# Patient Record
Sex: Male | Born: 1950 | Race: White | Hispanic: No | Smoking: Never smoker
Health system: Southern US, Community
[De-identification: ages and names within clinical notes are randomized; demographics above are authoritative.]

## PROBLEM LIST (undated history)

## (undated) DIAGNOSIS — Z87442 Personal history of urinary calculi: Secondary | ICD-10-CM

## (undated) DIAGNOSIS — G4733 Obstructive sleep apnea (adult) (pediatric): Secondary | ICD-10-CM

## (undated) DIAGNOSIS — E559 Vitamin D deficiency, unspecified: Secondary | ICD-10-CM

## (undated) DIAGNOSIS — M109 Gout, unspecified: Secondary | ICD-10-CM

## (undated) DIAGNOSIS — I10 Essential (primary) hypertension: Secondary | ICD-10-CM

## (undated) DIAGNOSIS — M47812 Spondylosis without myelopathy or radiculopathy, cervical region: Secondary | ICD-10-CM

## (undated) DIAGNOSIS — E785 Hyperlipidemia, unspecified: Secondary | ICD-10-CM

## (undated) HISTORY — DX: Obstructive sleep apnea (adult) (pediatric): G47.33

## (undated) HISTORY — DX: Gout, unspecified: M10.9

## (undated) HISTORY — DX: Personal history of urinary calculi: Z87.442

## (undated) HISTORY — DX: Vitamin D deficiency, unspecified: E55.9

## (undated) HISTORY — PX: SHOULDER SURGERY: SHX246

## (undated) HISTORY — DX: Spondylosis without myelopathy or radiculopathy, cervical region: M47.812

## (undated) HISTORY — DX: Essential (primary) hypertension: I10

## (undated) HISTORY — DX: Hyperlipidemia, unspecified: E78.5

## (undated) HISTORY — PX: VASECTOMY: SHX75

## (undated) HISTORY — PX: OTHER SURGICAL HISTORY: SHX169

---

## 1999-03-03 ENCOUNTER — Encounter: Payer: Self-pay | Admitting: Emergency Medicine

## 1999-03-03 ENCOUNTER — Emergency Department (HOSPITAL_COMMUNITY): Admission: EM | Admit: 1999-03-03 | Discharge: 1999-03-03 | Payer: Self-pay | Admitting: Emergency Medicine

## 2010-02-16 ENCOUNTER — Encounter: Admission: RE | Admit: 2010-02-16 | Discharge: 2010-03-31 | Payer: Self-pay | Admitting: Sports Medicine

## 2012-10-01 HISTORY — PX: BUNIONECTOMY: SHX129

## 2013-07-15 ENCOUNTER — Institutional Professional Consult (permissible substitution): Payer: Self-pay

## 2013-07-17 ENCOUNTER — Encounter (INDEPENDENT_AMBULATORY_CARE_PROVIDER_SITE_OTHER): Payer: 59

## 2013-07-17 ENCOUNTER — Ambulatory Visit (INDEPENDENT_AMBULATORY_CARE_PROVIDER_SITE_OTHER): Payer: 59

## 2013-07-17 VITALS — BP 121/70 | HR 75 | Resp 17 | Wt 185.0 lb

## 2013-07-17 DIAGNOSIS — M2020 Hallux rigidus, unspecified foot: Secondary | ICD-10-CM

## 2013-07-17 DIAGNOSIS — M79609 Pain in unspecified limb: Secondary | ICD-10-CM

## 2013-07-17 DIAGNOSIS — M2022 Hallux rigidus, left foot: Secondary | ICD-10-CM

## 2013-07-17 DIAGNOSIS — M2012 Hallux valgus (acquired), left foot: Secondary | ICD-10-CM

## 2013-07-17 DIAGNOSIS — M2010 Hallux valgus (acquired), unspecified foot: Secondary | ICD-10-CM

## 2013-07-17 NOTE — H&P (Signed)
Marland Kitchen

## 2013-07-17 NOTE — Patient Instructions (Signed)
Pre-Operative Instructions  Congratulations, you have decided to take an important step to improving your quality of life.  You can be assured that the doctors of [facility] will be with you every step of the way.  1. Plan to be at the surgery center/hospital at least 1 (one) hour prior to your scheduled time unless otherwise directed by the surgical center/hospital staff.  You must have a responsible adult accompany you, remain during the surgery and drive you home.  Make sure you have directions to the surgical center/hospital and know how to get there on time. 2. For hospital based surgery you will need to obtain a history and physical form from your family physician within 1 month prior to the date of surgery- we will give you a form for you primary physician.  3. We make every effort to accommodate the date you request for surgery.  There are however, times where surgery dates or times have to be moved.  We will contact you as soon as possible if a change in schedule is required.   4. No Aspirin/Ibuprofen for one week before surgery.  If you are on aspirin, any non-steroidal anti-inflammatory medications (Mobic, Aleve, Ibuprofen) you should stop taking it 7 days prior to your surgery.  You make take Tylenol  For pain prior to surgery.  5. Medications- If you are taking daily heart and blood pressure medications, seizure, reflux, allergy, asthma, anxiety, pain or diabetes medications, make sure the surgery center/hospital is aware before the day of surgery so they may notify you which medications to take or avoid the day of surgery. 6. No food or drink after midnight the night before surgery unless directed otherwise by surgical center/hospital staff. 7. No alcoholic beverages 24 hours prior to surgery.  No smoking 24 hours prior to or 24 hours after surgery. 8. Wear loose pants or shorts- loose enough to fit over bandages, boots, and casts. 9. No slip on shoes, sneakers are best. 10. Bring  your boot with you to the surgery center/hospital.  Also bring crutches or a walker if your physician has prescribed it for you.  If you do not have this equipment, it will be provided for you after surgery. 11. If you have not been contracted by the surgery center/hospital by the day before your surgery, call to confirm the date and time of your surgery. 12. Leave-time from work may vary depending on the type of surgery you have.  Appropriate arrangements should be made prior to surgery with your employer. 13. Prescriptions will be provided immediately following surgery by your doctor.  Have these filled as soon as possible after surgery and take the medication as directed. 14. Remove nail polish on the operative foot. 15. Wash the night before surgery.  The night before surgery wash the foot and leg well with the antibacterial soap provided and water paying special attention to beneath the toenails and in between the toes.  Rinse thoroughly with water and dry well with a towel.  Perform this wash unless told not to do so by your physician.  Enclosed: 1 Ice pack (please put in freezer the night before surgery)   1 Hibiclens skin cleaner   Pre-op Instructions  If you have any questions regarding the instructions, do not hesitate to call our office.  [place]: [address] [place], [place] [address] [phone]  [place]: [address] , [place] [address] [phone]  [place]: [address]  [place], [place] [address] [phone]  Dr. Endora Teresi   Tuchman DPM, Dr. Norman Regal DPM Dr. Melek Pownall DPM, Dr. M. Todd Hyatt DPM, Dr. Kathryn Egerton DPM 

## 2013-07-17 NOTE — Progress Notes (Signed)
  Subjective:    Patient ID: Vincent White, male    DOB: 03-Nov-1950, 62 y.o.   MRN: [id number]  HPIN FOOT PAIN        L LEFT 1ST MPJ       D YEARS       O GRADUAL       C RED SWELLING PAIN       A ACTIVITY ENCLOSED SHOES       T LARGER SHOES, ORTHOTICS, PADDING  Review of Systems  Constitutional: Negative.   HENT: Negative.   Eyes: Negative.   Cardiovascular: Negative.   Gastrointestinal: Negative.   Musculoskeletal: Positive for neck pain.       Diagnosed arthritis  Skin: Negative.   Neurological: Negative.   Hematological: Negative.   Psychiatric/Behavioral: Negative.        Objective:   Physical Exam  Constitutional: He is oriented to person, place, and time. He appears well-developed and well-nourished.  Cardiovascular:  Pulses:      Dorsalis pedis pulses are 2+ on the right side, and 2+ on the left side.       Posterior tibial pulses are 2+ on the right side, and 2+ on the left side.  Capillary refill timed 3-4 seconds all digits bilateral. Skin temperature warm. Turgor normal. No varicosities no edema noted bilateral   Musculoskeletal:  Orthopedic biomechanical exam reveals corrected bunion of right foot with well-healed incision. Left foot has significant moderate to severe bunion deformity with lateral deviation of the hallux, and erythema of first MTP joint. X-rays confirm elevated I am approximately 15-16 sesamoid position 6-7 some asymmetric joint space narrowing in subluxation MTP to hallux abductus angle greater than 25 a wide first metatarsal is also identified radiographically x-rays taken in January of 2014  Neurological: He is alert and oriented to person, place, and time. He has normal strength and normal reflexes.  Epicritic and proprioceptive sensations intact and symmetric bilateral. Normal plantar response and DTRs bilateral.  Skin: Skin is warm and dry. No cyanosis. Nails show no clubbing.  Skin color pigment normal hair growth diminished absent distal  nails criptotic well-healed incision right first MTP joint. Left foot demonstrate significant HAV deformity with erythematous first MTP joint  Psychiatric: He has a normal mood and affect. His behavior is normal.          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 Previous surgery require Lapidus bunionectomy should note the right foot is doing well still some limitation of motion first MTP area patient does have some mild arthropathy the great toe joint otherwise is ambulating well. Left foot is amendable to an Eliberto Ivory bunionectomy rather than Lapidus procedure. Discussed the 2 options with patient, and confident that the Eliberto Ivory will provide adequate correction and shortening his rehabilitation and recovery time. Patient is aware that he will not have to be on crutches for the [facility]. At this time consent for [facility] with screw fixations reviewed and signed, all questions asked medication are answered at this time. Surgery was scheduled his continues with appropriate followup. Surgery will be at risk especially surgical center under IV sedation and regional anesthetic block  Alvan Dame DPM

## 2013-09-07 ENCOUNTER — Ambulatory Visit: Payer: 59

## 2013-09-07 DIAGNOSIS — M2010 Hallux valgus (acquired), unspecified foot: Secondary | ICD-10-CM

## 2013-09-15 ENCOUNTER — Ambulatory Visit (INDEPENDENT_AMBULATORY_CARE_PROVIDER_SITE_OTHER): Payer: 59

## 2013-09-15 VITALS — BP 142/84 | HR 74 | Temp 98.2°F | Resp 12

## 2013-09-15 DIAGNOSIS — M2010 Hallux valgus (acquired), unspecified foot: Secondary | ICD-10-CM

## 2013-09-15 DIAGNOSIS — Z9889 Other specified postprocedural states: Secondary | ICD-10-CM

## 2013-09-15 DIAGNOSIS — M2020 Hallux rigidus, unspecified foot: Secondary | ICD-10-CM

## 2013-09-15 DIAGNOSIS — M2022 Hallux rigidus, left foot: Secondary | ICD-10-CM

## 2013-09-15 DIAGNOSIS — M2012 Hallux valgus (acquired), left foot: Secondary | ICD-10-CM

## 2013-09-15 NOTE — Patient Instructions (Signed)
ICE INSTRUCTIONS  Apply ice or cold pack to the affected area at least 3 times a day for 10-15 minutes each time.  You should also use ice after prolonged activity or vigorous exercise.  Do not apply ice longer than 20 minutes at one time.  Always keep a cloth between your skin and the ice pack to prevent burns.  Being consistent and following these instructions will help control your symptoms.  We suggest you purchase a gel ice pack because they are reusable and do bit leak.  Some of them are designed to wrap around the area.  Use the method that works best for you.  Here are some other suggestions for icing.   Use a frozen bag of peas or corn-inexpensive and molds well to your body, usually stays frozen for 10 to 20 minutes.  Wet a towel with cold water and squeeze out the excess until it's damp.  Place in a bag in the freezer for 20 minutes. Then remove and use.  Also beginning continue daily range of motion exercises moving the big toe up and down. Move the toe up and down 100 times daily or whenever he had to opportunity. This to prevent stiffness and restore flexibility. Maintain anklet to keep down on swelling maintain boot for 4 more weeks to

## 2013-09-15 NOTE — Progress Notes (Signed)
   Subjective:    Patient ID: Vincent White, male    DOB: 1951/09/22, 62 y.o.   MRN: [id number]  HPI Comments: '' DOS 09/07/13 '' THE LT FOOT HAVE DISCOMFORT BUT NOT LIKE THE OTHER FOOT.''     Review of Systems deferred at this visit     Objective:   Physical Exam Neurovascular status is intact left foot has mild edema and ecchymosis 8 days status post Austin bunionectomy left foot with screw fixation. X-rays reveal intact screw fixation with clinical and radiographic alignment of the hallux. There is good range of motion dorsiflexion plantar flexion of the toe approximately 30-40 dorsiflexion 10-15 plantar flexion. Incision is clean dry and well coapted dressings were intact and dry      Assessment & Plan:  Assessment good postop progress following bunionectomy left foot presto compressive dressing reapplied. Maintain dressing until Sunday then can remove the dressing and begin normal bathing and hygiene maintain Ace anklet for compression also maintain fracture boot for 4 more weeks to mobilize foot. Return in 4 weeks for followup x-ray and reevaluation. Contact us with any changes or problems occur.  Alvan Dame DPM

## 2013-10-06 NOTE — Progress Notes (Signed)
1) Austin bunionectomy with screw fixation left foot

## 2013-10-16 ENCOUNTER — Ambulatory Visit (INDEPENDENT_AMBULATORY_CARE_PROVIDER_SITE_OTHER): Payer: 59

## 2013-10-16 VITALS — BP 142/84 | HR 86 | Resp 12

## 2013-10-16 DIAGNOSIS — Z9889 Other specified postprocedural states: Secondary | ICD-10-CM

## 2013-10-16 DIAGNOSIS — M2010 Hallux valgus (acquired), unspecified foot: Secondary | ICD-10-CM

## 2013-10-16 DIAGNOSIS — L259 Unspecified contact dermatitis, unspecified cause: Secondary | ICD-10-CM

## 2013-10-16 MED ORDER — CEPHALEXIN 500 MG PO CAPS: 500.0000 mg | ORAL_CAPSULE | Freq: Three times a day (TID) | ORAL | Status: DC

## 2013-10-16 NOTE — Progress Notes (Signed)
   Subjective:    Patient ID: Vincent White, male    DOB: September 13, 1951, 63 y.o.   MRN: [id number]  HPI patient presents at this time for postop visit is just over one month 56 week status post Austin bunionectomy with screw fixation left foot. No complaints of pain or discomfort ambulating with air fracture walker as instructed. In the last week or 2 develop a rash over the dorsum of the left foot over the first MTP and second MTP area he was applying some Neosporin and a Telfa pad to the area and caused a breakout he has similar probably is using Neosporin his hand and that was treated with clobetasol cream by his dermatologist.    Review of Systems no new changes or findings     Objective:   Physical Exam Neurovascular status is intact DP and PT posterior were for Refill time 3 seconds all digits there is an area of erythema with a rash with some slight weeping over the dorsum of first MTP area no increased temperature no ascending cellulitis or lymphangitis noted no malodor noted. Patient does describe some pruritus he stopped using the Neosporin and since then has not started on a steroid cream at this point. X-rays reveal good position the osteotomy and screw fixation clinically good range of motion the hallux alignment of the great toe no pain or tenderness and crepitus aren't or crepitus on range of motion or her ambulation. Patient may discontinue air fracture boot at this time return to come for walking tennis or athletic shoes. Patient been working as office said down to his job without restrictions at this point.       Assessment & Plan:  Assessment this time is eczema versus contact dermatitis dorsum left foot on your the operative site posse secondary to Neosporin Polysporin and synthetic dressing application. Plan at this time discontinued and discontinue the Neosporin patient will initiate clobetasol cream application daily maintain cotton gauze and anklet dressing to maintain  compression. Also placed on a short-term regimen of antibiotic cephalexin 5 mg daily x10 days as a precaution prevent any infective process. Reappointed in 4 weeks for further followup and reassessment both postoperatively and for the dermatitis.  Harriet Masson DPM

## 2013-10-16 NOTE — Patient Instructions (Signed)
ANTIBACTERIAL SOAP INSTRUCTIONS  THE DAY AFTER PROCEDURE  Please follow the instructions your doctor has marked.   Shower as usual. Before getting out, place a drop of antibacterial liquid soap (Dial) on a wet, clean washcloth.  Gently wipe washcloth over affected area.  Afterward, rinse the area with warm water.  Blot the area dry with a soft cloth and cover with antibiotic ointment (neosporin, polysporin, bacitracin) and band aid or gauze and tape  Place 3-4 drops of antibacterial liquid soap in a quart of warm tap water.  Submerge foot into water for 20 minutes.  If bandage was applied after your procedure, leave on to allow for easy lift off, then remove and continue with soak for the remaining time.  Next, blot area dry with a soft cloth and cover with a bandage.  Apply other medications as directed by your doctor,   After washing the foot and the area of rash applied clobetasol steroid cream as instructed once or twice daily for the next 2-3 weeks avoid any sympathetic dressings;  use plain cotton gauze and maintain anklet as needed

## 2013-11-13 ENCOUNTER — Ambulatory Visit (INDEPENDENT_AMBULATORY_CARE_PROVIDER_SITE_OTHER): Payer: 59

## 2013-11-13 VITALS — BP 152/84 | HR 74 | Resp 12

## 2013-11-13 DIAGNOSIS — M2010 Hallux valgus (acquired), unspecified foot: Secondary | ICD-10-CM

## 2013-11-13 DIAGNOSIS — Z9889 Other specified postprocedural states: Secondary | ICD-10-CM

## 2013-11-13 DIAGNOSIS — L259 Unspecified contact dermatitis, unspecified cause: Secondary | ICD-10-CM

## 2013-11-13 NOTE — Patient Instructions (Signed)
ICE INSTRUCTIONS  Apply ice or cold pack to the affected area at least 3 times a day for 10-15 minutes each time.  You should also use ice after prolonged activity or vigorous exercise.  Do not apply ice longer than 20 minutes at one time.  Always keep a cloth between your skin and the ice pack to prevent burns.  Being consistent and following these instructions will help control your symptoms.  We suggest you purchase a gel ice pack because they are reusable and do bit leak.  Some of them are designed to wrap around the area.  Use the method that works best for you.  Here are some other suggestions for icing.   Use a frozen bag of peas or corn-inexpensive and molds well to your body, usually stays frozen for 10 to 20 minutes.  Wet a towel with cold water and squeeze out the excess until it's damp.  Place in a bag in the freezer for 20 minutes. Then remove and use.  Use ice and possibly consider using compression stocking to help reduce the swelling over the next several months to do this on an as-needed basis

## 2013-11-13 NOTE — Progress Notes (Signed)
   Subjective:    Patient ID: Vincent White, male    DOB: [birth year], 63 y.o.   MRN: [id number]  HPI ''DOS 09/07/14  LT FOOT AND RASH ON TOP OF THE FOOT IS MUCH BETTER.''    Review of Systems no new changes or fine     Objective:   Physical Exam Vascular status is intact pedal pulses palpable DP and PT posterior were for left foot Refill time 3 seconds rashes result slight erythema the incision area consistent with postop course just over 2 months postop still some mild edema first MTP joint area although the range of motion approximately 45-50 dorsiflexion as well as 510 plantar flexion. There is no pain no crepitus patient ambulating comfortably has returned all regular work activities. Briefly discussed possibly future followup with orthoses Nadara Mustard this time maintain compression stable boot and shoe ice and heat help control the edema which should resolve over the next 3 months.       Assessment & Plan:  Assessment good postop progress following Austin bunionectomy screw fixation left foot. X-rays reveal good clinical and radiographic alignment is no displacements no pain mild postoperative edema still present recheck in 3 months for long-term postop followup consider orthoses in the future as well  Harriet Masson DPM

## 2014-02-12 ENCOUNTER — Ambulatory Visit: Payer: 59

## 2017-02-05 HISTORY — PX: OTHER SURGICAL HISTORY: SHX169

## 2017-03-04 DIAGNOSIS — E782 Mixed hyperlipidemia: Secondary | ICD-10-CM | POA: Diagnosis not present

## 2017-03-04 DIAGNOSIS — Z0000 Encounter for general adult medical examination without abnormal findings: Secondary | ICD-10-CM | POA: Diagnosis not present

## 2017-03-04 DIAGNOSIS — Z125 Encounter for screening for malignant neoplasm of prostate: Secondary | ICD-10-CM | POA: Diagnosis not present

## 2017-03-04 DIAGNOSIS — Z23 Encounter for immunization: Secondary | ICD-10-CM | POA: Diagnosis not present

## 2017-03-04 DIAGNOSIS — E559 Vitamin D deficiency, unspecified: Secondary | ICD-10-CM | POA: Diagnosis not present

## 2017-03-04 DIAGNOSIS — E119 Type 2 diabetes mellitus without complications: Secondary | ICD-10-CM | POA: Diagnosis not present

## 2017-03-04 DIAGNOSIS — Z8639 Personal history of other endocrine, nutritional and metabolic disease: Secondary | ICD-10-CM | POA: Diagnosis not present

## 2017-03-04 DIAGNOSIS — I10 Essential (primary) hypertension: Secondary | ICD-10-CM | POA: Diagnosis not present

## 2017-03-04 DIAGNOSIS — M109 Gout, unspecified: Secondary | ICD-10-CM | POA: Diagnosis not present

## 2017-03-04 DIAGNOSIS — Z7984 Long term (current) use of oral hypoglycemic drugs: Secondary | ICD-10-CM | POA: Diagnosis not present

## 2017-03-11 DIAGNOSIS — Z4789 Encounter for other orthopedic aftercare: Secondary | ICD-10-CM | POA: Diagnosis not present

## 2017-03-11 DIAGNOSIS — M25522 Pain in left elbow: Secondary | ICD-10-CM | POA: Diagnosis not present

## 2017-03-11 DIAGNOSIS — M109 Gout, unspecified: Secondary | ICD-10-CM | POA: Diagnosis not present

## 2017-03-21 DIAGNOSIS — Z1211 Encounter for screening for malignant neoplasm of colon: Secondary | ICD-10-CM | POA: Diagnosis not present

## 2017-03-27 DIAGNOSIS — G4733 Obstructive sleep apnea (adult) (pediatric): Secondary | ICD-10-CM | POA: Diagnosis not present

## 2018-04-07 ENCOUNTER — Other Ambulatory Visit (HOSPITAL_COMMUNITY): Payer: Self-pay

## 2018-04-11 ENCOUNTER — Other Ambulatory Visit (HOSPITAL_COMMUNITY): Payer: Self-pay | Admitting: Family Medicine

## 2018-04-11 DIAGNOSIS — R011 Cardiac murmur, unspecified: Secondary | ICD-10-CM

## 2018-04-14 ENCOUNTER — Ambulatory Visit (HOSPITAL_COMMUNITY)
Admission: RE | Admit: 2018-04-14 | Discharge: 2018-04-14 | Disposition: A | Discharge status: Home / Self Care | Payer: Medicare Other | Source: Ambulatory Visit | Attending: Family Medicine | Admitting: Family Medicine

## 2018-04-14 DIAGNOSIS — I340 Nonrheumatic mitral (valve) insufficiency: Secondary | ICD-10-CM | POA: Insufficient documentation

## 2018-04-14 DIAGNOSIS — R011 Cardiac murmur, unspecified: Secondary | ICD-10-CM | POA: Diagnosis present

## 2018-04-14 DIAGNOSIS — I77810 Thoracic aortic ectasia: Secondary | ICD-10-CM | POA: Insufficient documentation

## 2018-04-14 DIAGNOSIS — I272 Pulmonary hypertension, unspecified: Secondary | ICD-10-CM | POA: Diagnosis not present

## 2018-04-14 NOTE — Progress Notes (Signed)
  Echocardiogram 2D Echocardiogram has been performed.  Bobbye Charleston 04/14/2018, 2:51 PM

## 2018-04-22 ENCOUNTER — Telehealth: Payer: Self-pay

## 2018-04-22 NOTE — Telephone Encounter (Signed)
Fax sent to scheduling, notes have been filed.

## 2018-04-23 ENCOUNTER — Telehealth: Payer: Self-pay

## 2018-04-23 NOTE — Telephone Encounter (Signed)
Referral sent to scheduling, notes on file.

## 2018-05-28 ENCOUNTER — Ambulatory Visit: Payer: Medicare Other | Admitting: Physician Assistant

## 2018-06-11 NOTE — Progress Notes (Signed)
Cardiology Office Note   Date:  06/12/2018   ID:  [name], DOB December 13, 1950, MRN [id number]  PCP:  Leighton Ruff, MD  Cardiologist:   No primary care provider on file. Referring:  Leighton Ruff, MD  Chief Complaint  Patient presents with  . Heart Murmur      History of Present Illness: Vincent Cruey. is a 67 y.o. male who presents for presents for evaluation of heart murmur.  He recently had problems with hypertension was taking a lot of Afrin.  When he stopped doing this his blood pressure came down.  During that time he was noted to have a murmur and was sent for an echo.  This demonstrated moderate mitral regurgitation.  He thought it was related to his blood pressure.  He is never had any symptoms.  He said he had a murmur as a child but is never been evaluated.  He is otherwise not had any cardiac history.  Denies any chest pressure, neck or arm discomfort.  He denies any palpitations, presyncope or syncope.  He might get some shortness of breath with activity but is not describing any PND or orthopnea.  He has significant cardiovascular risk factors with a family history and others as below.  He is active will be slightly limited by gout.   Past Medical History:  Diagnosis Date  . Cervical spondylosis   . Gout   . History of kidney stones   . HLD (hyperlipidemia)   . Hypertension   . OSA (obstructive sleep apnea)    CPAP  . Vitamin D deficiency     Past Surgical History:  Procedure Laterality Date  . bone spur surgery  02/05/2017   mass removed on left elbow  . BUNIONECTOMY Right 2014  . SHOULDER SURGERY Right   . thumb surgery Right   . VASECTOMY       Current Outpatient Medications  Medication Sig Dispense Refill  . amLODipine (NORVASC) 5 MG tablet Take 5 mg by mouth daily.  0  . aspirin 81 MG tablet Take 81 mg by mouth daily.    . Celecoxib (CELEBREX PO) Take by mouth. Take 1-2 capsules by mouth daily as needed.    . Cholecalciferol 2000 units  CAPS Take 1 capsule by mouth daily.    Marland Kitchen CIALIS 20 MG tablet     . HYDROcodone-acetaminophen (NORCO) 10-325 MG per tablet Take 1 tablet by mouth 2 (two) times daily.    Marland Kitchen losartan (COZAAR) 100 MG tablet Take 100 mg by mouth daily.  1  . metFORMIN (GLUCOPHAGE) 500 MG tablet Take 500 mg by mouth 2 (two) times daily with a meal.    . atorvastatin (LIPITOR) 40 MG tablet Take 1 tablet (40 mg total) by mouth daily. 90 tablet 3   No current facility-administered medications for this visit.     Allergies:   Allopurinol    Social History:  The patient  reports that he has never smoked. He has never used smokeless tobacco. He reports that he drinks about 1.0 standard drinks of alcohol per week. He reports that he does not use drugs.   Family History:  The patient's family history includes Cancer - Other in his mother and sister; Diabetes in his father and mother; Gout in his brother; Heart attack (age of onset: 85) in his father; Hypertension in his father, maternal grandfather, and maternal grandmother.    ROS:  Please see the history of present illness.   Otherwise, review  of systems are positive for none.   All other systems are reviewed and negative.    PHYSICAL EXAM: VS:  BP (!) 142/82 (BP Location: Right Arm, Patient Position: Sitting, Cuff Size: Large)   Pulse 76   Ht 5\' 5"  (1.651 m)   Wt 200 lb 3.2 oz (90.8 kg)   BMI 33.32 kg/m  , BMI Body mass index is 33.32 kg/m. GENERAL:  Well appearing HEENT:  Pupils equal round and reactive, fundi not visualized, oral mucosa unremarkable NECK:  No jugular venous distention, waveform within normal limits, carotid upstroke brisk and symmetric, right bruits, no thyromegaly LYMPHATICS:  No cervical, inguinal adenopathy LUNGS:  Clear to auscultation bilaterally BACK:  No CVA tenderness CHEST:  Unremarkable HEART:  PMI not displaced or sustained,S1 and S2 within normal limits, no S3, no S4, no clicks, no rubs, 3 out of 6 systolic murmur radiating  slightly at the aortic outflow tract, no diastolic murmurs ABD:  Flat, positive bowel sounds normal in frequency in pitch, no bruits, no rebound, no guarding, no midline pulsatile mass, no hepatomegaly, no splenomegaly EXT:  2 plus pulses throughout, trace ankle edema, no cyanosis no clubbing, significant gouty changes in his knees and hands SKIN:  No rashes no nodules NEURO:  Cranial nerves II through XII grossly intact, motor grossly intact throughout PSYCH:  Cognitively intact, oriented to person place and time    EKG:  EKG is ordered today. The ekg ordered today demonstrates sinus rhythm, rate 76, axis within normal limits, intervals within normal limits, no acute ST-T wave changes.   Recent Labs: No results found for requested labs within last 8760 hours.    Lipid Panel No results found for: CHOL, TRIG, HDL, CHOLHDL, VLDL, LDLCALC, LDLDIRECT    Wt Readings from Last 3 Encounters:  06/12/18 200 lb 3.2 oz (90.8 kg)  07/17/13 185 lb (83.9 kg)  07/17/13 185 lb (83.9 kg)      Other studies Reviewed: Additional studies/ records that were reviewed today include: Labs. Review of the above records demonstrates:  Please see elsewhere in the note.     ASSESSMENT AND PLAN:  MITRAL REGURGITATION:    I will follow this up clinically and probably with repeat echocardiography in a couple of years.  At this point no change in therapy is indicated.  HTN: His blood pressure has been better and we talked about the need for tight control.  He says his blood pressure at home is in the 1 teens over 36s so he will continue the meds as listed.  DYSLIPIDEMIA: I will switch from simvastatin to Lipitor 40 mg daily.  I will check a lipid profile in 8 weeks but this can be done with his primary care office.  BRUIT:  I will check a Doppler.  DM:  A1C is 6.9.    SOB:  I will bring the patient back for a POET (Plain Old Exercise Test). This will allow me to screen for obstructive coronary disease,  risk stratify and very importantly provide a prescription for exercise.  Current medicines are reviewed at length with the patient today.  The patient does not have concerns regarding medicines.  The following changes have been made:  As above  Labs/ tests ordered today include:   Orders Placed This Encounter  Procedures  . Exercise Tolerance Test  . EKG 12-Lead     Disposition:   FU with me in one year.     Signed, Minus Breeding, MD  06/12/2018 5:18 PM  [facility]

## 2018-06-12 ENCOUNTER — Encounter: Payer: Self-pay | Admitting: Cardiology

## 2018-06-12 ENCOUNTER — Ambulatory Visit: Payer: Medicare Other | Admitting: Cardiology

## 2018-06-12 VITALS — BP 142/82 | HR 76 | Ht 65.0 in | Wt 200.2 lb

## 2018-06-12 DIAGNOSIS — I10 Essential (primary) hypertension: Secondary | ICD-10-CM | POA: Insufficient documentation

## 2018-06-12 DIAGNOSIS — R0602 Shortness of breath: Secondary | ICD-10-CM | POA: Diagnosis not present

## 2018-06-12 DIAGNOSIS — E785 Hyperlipidemia, unspecified: Secondary | ICD-10-CM | POA: Insufficient documentation

## 2018-06-12 DIAGNOSIS — R0989 Other specified symptoms and signs involving the circulatory and respiratory systems: Secondary | ICD-10-CM | POA: Insufficient documentation

## 2018-06-12 DIAGNOSIS — I340 Nonrheumatic mitral (valve) insufficiency: Secondary | ICD-10-CM | POA: Diagnosis not present

## 2018-06-12 MED ORDER — ATORVASTATIN CALCIUM 40 MG PO TABS: 40.0000 mg | ORAL_TABLET | Freq: Every day | ORAL | 3 refills | Status: DC

## 2018-06-12 NOTE — Patient Instructions (Addendum)
Medication Instructions:  Your physician has recommended you make the following change in your medication:  STOP Simvastatin START Atorvastatin 40mg  daily an Rx has been sent to your pharmacy   Labwork: None ordered  Testing/Procedures: Your physician has requested that you have an exercise tolerance test. For further information please visit [email or web address]. Please also follow instruction sheet, as given.  Your physician has requested that you have a carotid duplex. This test is an ultrasound of the carotid arteries in your neck. It looks at blood flow through these arteries that supply the brain with blood. Allow one hour for this exam. There are no restrictions or special instructions.    Follow-Up: Your physician wants you to follow-up in: 1 year with Dr.Hochrein You will receive a reminder letter in the mail two months in advance. If you don't receive a letter, please call our office to schedule the follow-up appointment.   Any Other Special Instructions Will Be Listed Below (If Applicable).     If you need a refill on your cardiac medications before your next appointment, please call your pharmacy.

## 2018-06-19 ENCOUNTER — Telehealth (HOSPITAL_COMMUNITY): Payer: Self-pay

## 2018-06-19 NOTE — Telephone Encounter (Signed)
Encounter complete. 

## 2018-06-20 ENCOUNTER — Telehealth (HOSPITAL_COMMUNITY): Payer: Self-pay

## 2018-06-20 NOTE — Telephone Encounter (Signed)
Encounter complete. 

## 2018-06-24 ENCOUNTER — Ambulatory Visit: Payer: Medicare Other | Admitting: Physician Assistant

## 2018-06-24 ENCOUNTER — Ambulatory Visit (HOSPITAL_BASED_OUTPATIENT_CLINIC_OR_DEPARTMENT_OTHER)
Admission: RE | Admit: 2018-06-24 | Discharge: 2018-06-24 | Disposition: A | Discharge status: Home / Self Care | Payer: Medicare Other | Source: Ambulatory Visit | Attending: Internal Medicine | Admitting: Internal Medicine

## 2018-06-24 ENCOUNTER — Ambulatory Visit (HOSPITAL_COMMUNITY)
Admission: RE | Admit: 2018-06-24 | Discharge: 2018-06-24 | Disposition: A | Discharge status: Home / Self Care | Payer: Medicare Other | Source: Ambulatory Visit | Attending: Internal Medicine | Admitting: Internal Medicine

## 2018-06-24 ENCOUNTER — Encounter

## 2018-06-24 DIAGNOSIS — R0989 Other specified symptoms and signs involving the circulatory and respiratory systems: Secondary | ICD-10-CM

## 2018-06-24 DIAGNOSIS — R0602 Shortness of breath: Secondary | ICD-10-CM | POA: Diagnosis present

## 2018-06-25 LAB — EXERCISE TOLERANCE TEST
CHL CUP MPHR: 153 {beats}/min
CHL RATE OF PERCEIVED EXERTION: 19
CSEPHR: 87 %
Estimated workload: 10.1 METS
Exercise duration (min): 9 min
Exercise duration (sec): 0 s
Peak HR: 134 {beats}/min
Rest HR: 75 {beats}/min

## 2019-03-18 ENCOUNTER — Telehealth: Payer: Self-pay | Admitting: *Deleted

## 2019-03-18 NOTE — Telephone Encounter (Signed)
A message was left, re: follow up visit. 

## 2019-05-18 ENCOUNTER — Other Ambulatory Visit: Payer: Self-pay

## 2019-05-18 MED ORDER — ATORVASTATIN CALCIUM 40 MG PO TABS: 40.0000 mg | ORAL_TABLET | Freq: Every day | ORAL | 0 refills | Status: DC

## 2019-07-13 ENCOUNTER — Telehealth: Payer: Self-pay | Admitting: *Deleted

## 2019-07-13 NOTE — Telephone Encounter (Signed)
A message was left, re: his follow up visit. 

## 2019-07-20 ENCOUNTER — Telehealth: Payer: Self-pay | Admitting: *Deleted

## 2019-07-20 NOTE — Telephone Encounter (Signed)
A message was left, re: his follow up visit. 

## 2019-09-02 ENCOUNTER — Other Ambulatory Visit: Payer: Self-pay

## 2019-09-02 MED ORDER — ATORVASTATIN CALCIUM 40 MG PO TABS: 40.0000 mg | ORAL_TABLET | Freq: Every day | ORAL | 0 refills | Status: DC

## 2019-10-06 ENCOUNTER — Other Ambulatory Visit: Payer: Self-pay | Admitting: Cardiology

## 2019-10-06 NOTE — Progress Notes (Signed)
Cardiology Office Note   Date:  10/08/2019   ID:  Vincent White., DOB 1950/12/26, MRN [id number]  PCP:  Leighton Ruff, MD  Cardiologist:   No primary care provider on file. Referring:  Leighton Ruff, MD  Chief Complaint  Patient presents with  . Mitral Regurgitation      History of Present Illness: Vincent White. is a 69 y.o. male who presents for presents for evaluation of heart murmur.  He was found to have moderate MR.   He did have SOB but a normal POET (Plain Old Exercise Treadmill) last year.   Since I last saw him he has done well.  The patient denies any new symptoms such as chest discomfort, neck or arm discomfort. There has been no new shortness of breath, PND or orthopnea. There have been no reported palpitations, presyncope or syncope.  His blood pressures been running a little high.  It seems to be higher in the morning.  He has got some joint pains.  He has decreased sleep.  However, otherwise he does okay.   Past Medical History:  Diagnosis Date  . Cervical spondylosis   . Gout   . History of kidney stones   . HLD (hyperlipidemia)   . Hypertension   . OSA (obstructive sleep apnea)    CPAP  . Vitamin D deficiency     Past Surgical History:  Procedure Laterality Date  . bone spur surgery  02/05/2017   mass removed on left elbow  . BUNIONECTOMY Right 2014  . SHOULDER SURGERY Right   . thumb surgery Right   . VASECTOMY       Current Outpatient Medications  Medication Sig Dispense Refill  . amLODipine (NORVASC) 5 MG tablet Take 5 mg by mouth 2 (two) times daily.   0  . aspirin 81 MG tablet Take 81 mg by mouth daily.    Marland Kitchen atorvastatin (LIPITOR) 40 MG tablet TAKE 1 TABLET BY MOUTH EVERY DAY 30 tablet 0  . Celecoxib (CELEBREX PO) Take by mouth. Take 1-2 capsules by mouth daily as needed.    . Cholecalciferol 2000 units CAPS Take 1 capsule by mouth daily.    Marland Kitchen CIALIS 20 MG tablet     . HYDROcodone-acetaminophen (NORCO) 10-325 MG per tablet Take 1  tablet by mouth 2 (two) times daily.    Marland Kitchen losartan (COZAAR) 100 MG tablet Take 100 mg by mouth daily.  1  . metFORMIN (GLUCOPHAGE) 500 MG tablet Take 500 mg by mouth 2 (two) times daily with a meal.     No current facility-administered medications for this visit.    Allergies:   Allopurinol     ROS:  Please see the history of present illness.   Otherwise, review of systems are positive for none.   All other systems are reviewed and negative.    PHYSICAL EXAM: VS:  BP (!) 158/62   Pulse 81   Temp 98.4 F (36.9 C)   Ht 5\' 5"  (1.651 m)   Wt 205 lb 12.8 oz (93.4 kg)   SpO2 94%   BMI 34.25 kg/m  , BMI Body mass index is 34.25 kg/m. GENERAL:  Well appearing NECK:  No jugular venous distention, waveform within normal limits, carotid upstroke brisk and symmetric, no bruits, no thyromegaly LUNGS:  Clear to auscultation bilaterally CHEST:  Unremarkable HEART:  PMI not displaced or sustained,S1 and S2 within normal limits, no S3, no S4, no clicks, no rubs, 3 out of 6 systolic murmur  that seems to be holosystolic murmur heard mostly on the right sternal border and less in the apex, no diastolic murmurs ABD:  Flat, positive bowel sounds normal in frequency in pitch, no bruits, no rebound, no guarding, no midline pulsatile mass, no hepatomegaly, no splenomegaly EXT:  2 plus pulses throughout, no edema, no cyanosis no clubbing      EKG:  EKG is  ordered today. The ekg ordered today demonstrates sinus rhythm, rate 81, axis within normal limits, intervals within normal limits, no acute ST-T wave changes.   Recent Labs: No results found for requested labs within last 8760 hours.    Lipid Panel No results found for: CHOL, TRIG, HDL, CHOLHDL, VLDL, LDLCALC, LDLDIRECT    Wt Readings from Last 3 Encounters:  10/08/19 205 lb 12.8 oz (93.4 kg)  06/12/18 200 lb 3.2 oz (90.8 kg)  07/17/13 185 lb (83.9 kg)      Other studies Reviewed: Additional studies/ records that were reviewed today  include: Labs Review of the above records demonstrates:  See elsewhere   ASSESSMENT AND PLAN:  MITRAL REGURGITATION:     In July will of been 2 years since his echo.  I will repeat an echocardiogram.  HTN: His blood pressure is elevated.  I considered adding HCTZ but he has done this before.  Not sure why this was changed.  He is going to start taking his losartan at night and then keep a blood pressure diary.  If he is not treated he may need an increase in medications or adding chlorthalidone.  We could also consider spironolactone.   DYSLIPIDEMIA:   Lipids are excellent.  LDL 35 HDL 45.   DM:  A1C is 6.6 and down from 6.9.  No change in therapy.  COVID EDUCATION: He is interested in getting the vaccine available.  Current medicines are reviewed at length with the patient today.  The patient does not have concerns regarding medicines.  The following changes have been made:    Labs/ tests ordered today include:   Orders Placed This Encounter  Procedures  . EKG 12-Lead  . ECHOCARDIOGRAM COMPLETE     Disposition:   FU with me in one year.     Signed, Minus Breeding, MD  10/08/2019 9:37 AM    [facility]

## 2019-10-08 ENCOUNTER — Encounter: Payer: Self-pay | Admitting: Cardiology

## 2019-10-08 ENCOUNTER — Encounter (INDEPENDENT_AMBULATORY_CARE_PROVIDER_SITE_OTHER): Payer: Self-pay

## 2019-10-08 ENCOUNTER — Other Ambulatory Visit: Payer: Self-pay

## 2019-10-08 ENCOUNTER — Ambulatory Visit: Payer: Medicare Other | Admitting: Cardiology

## 2019-10-08 VITALS — BP 158/62 | HR 81 | Temp 98.4°F | Ht 65.0 in | Wt 205.8 lb

## 2019-10-08 DIAGNOSIS — Z7189 Other specified counseling: Secondary | ICD-10-CM

## 2019-10-08 DIAGNOSIS — I340 Nonrheumatic mitral (valve) insufficiency: Secondary | ICD-10-CM | POA: Diagnosis not present

## 2019-10-08 DIAGNOSIS — E119 Type 2 diabetes mellitus without complications: Secondary | ICD-10-CM

## 2019-10-08 DIAGNOSIS — E785 Hyperlipidemia, unspecified: Secondary | ICD-10-CM | POA: Diagnosis not present

## 2019-10-08 DIAGNOSIS — I10 Essential (primary) hypertension: Secondary | ICD-10-CM | POA: Diagnosis not present

## 2019-10-08 DIAGNOSIS — R011 Cardiac murmur, unspecified: Secondary | ICD-10-CM | POA: Diagnosis not present

## 2019-10-08 NOTE — Patient Instructions (Signed)
Medication Instructions:  No changes *If you need a refill on your cardiac medications before your next appointment, please call your pharmacy*  Lab Work: None Ordered  Testing/Procedures: Your physician has requested that you have an echocardiogram. Echocardiography is a painless test that uses sound waves to create images of your heart. It provides your doctor with information about the size and shape of your heart and how well your heart's chambers and valves are working. This procedure takes approximately one hour. There are no restrictions for this procedure. [address]   Follow-Up: At [facility], you and your health needs are our priority.  As part of our continuing mission to provide you with exceptional heart care, we have created designated Provider Care Teams.  These Care Teams include your primary Cardiologist (physician) and Advanced Practice Providers (APPs -  Physician Assistants and Nurse Practitioners) who all work together to provide you with the care you need, when you need it.  Your next appointment:   1- 1.5 year(s)  The format for your next appointment:   In Person  Provider:   Minus Breeding, MD  Other Instructions Monitor your blood pressure

## 2019-11-02 ENCOUNTER — Other Ambulatory Visit: Payer: Self-pay | Admitting: *Deleted

## 2019-11-02 MED ORDER — ATORVASTATIN CALCIUM 40 MG PO TABS: 40.0000 mg | ORAL_TABLET | Freq: Every day | ORAL | 6 refills | Status: DC

## 2019-11-09 ENCOUNTER — Ambulatory Visit: Payer: Medicare Other

## 2019-11-10 ENCOUNTER — Other Ambulatory Visit: Payer: Medicare Other

## 2019-11-15 ENCOUNTER — Ambulatory Visit: Payer: Medicare Other

## 2019-11-27 ENCOUNTER — Ambulatory Visit: Payer: Medicare Other | Attending: Internal Medicine

## 2019-11-27 DIAGNOSIS — Z23 Encounter for immunization: Secondary | ICD-10-CM | POA: Insufficient documentation

## 2019-11-27 NOTE — Progress Notes (Signed)
   [id number]  Name:  Dock Shamburg.    MRN: [id number] DOB: 1950-10-28  11/27/2019  Mr. Eichorst was observed post Covid-19 immunization for 15 minutes without incidence. He was provided with Vaccine Information Sheet and instruction to access the V-Safe system.   Mr. Stuedemann was instructed to call 911 with any severe reactions post vaccine: Marland Kitchen Difficulty breathing  . Swelling of your face and throat  . A fast heartbeat  . A bad rash all over your body  . Dizziness and weakness    Immunizations Administered    Name Date Dose VIS Date Route   Pfizer COVID-19 Vaccine 11/27/2019 12:11 PM 0.3 mL 09/11/2019 Intramuscular   Manufacturer: [place]   Lot: [id number]   [place]: [id number]

## 2019-12-22 ENCOUNTER — Ambulatory Visit: Payer: Medicare Other | Attending: Internal Medicine

## 2019-12-22 DIAGNOSIS — Z23 Encounter for immunization: Secondary | ICD-10-CM

## 2019-12-22 NOTE — Progress Notes (Signed)
   [id number] [facility]ination [facility]  Name:  Vincent White.    MRN: [id number] DOB: 11-20-1950  12/22/2019  Mr. Vincent White was observed post Covid-19 immunization for 15 minutes without incident. He was provided with Vaccine Information Sheet and instruction to access the V-Safe system.   Mr. Vincent White was instructed to call 911 with any severe reactions post vaccine: Vincent White Kitchen Difficulty breathing  . Swelling of face and throat  . A fast heartbeat  . A bad rash all over body  . Dizziness and weakness   Immunizations Administered    Name Date Dose VIS Date Route   Pfizer COVID-19 Vaccine 12/22/2019  2:48 PM 0.3 mL 09/11/2019 Intramuscular   Manufacturer: [place]   Lot: [id number]   [place]: [id number]

## 2020-03-08 ENCOUNTER — Other Ambulatory Visit (HOSPITAL_COMMUNITY): Payer: Medicare Other

## 2020-03-24 ENCOUNTER — Ambulatory Visit (HOSPITAL_COMMUNITY): Payer: Medicare Other | Attending: Cardiology

## 2020-03-24 ENCOUNTER — Other Ambulatory Visit: Payer: Self-pay

## 2020-03-24 DIAGNOSIS — I340 Nonrheumatic mitral (valve) insufficiency: Secondary | ICD-10-CM | POA: Insufficient documentation

## 2020-05-07 ENCOUNTER — Other Ambulatory Visit: Payer: Self-pay | Admitting: Cardiology

## 2020-11-10 DIAGNOSIS — E119 Type 2 diabetes mellitus without complications: Secondary | ICD-10-CM | POA: Diagnosis not present

## 2020-11-22 DIAGNOSIS — E1169 Type 2 diabetes mellitus with other specified complication: Secondary | ICD-10-CM | POA: Diagnosis not present

## 2020-11-22 DIAGNOSIS — Z7984 Long term (current) use of oral hypoglycemic drugs: Secondary | ICD-10-CM | POA: Diagnosis not present

## 2020-11-22 DIAGNOSIS — I10 Essential (primary) hypertension: Secondary | ICD-10-CM | POA: Diagnosis not present

## 2020-11-22 DIAGNOSIS — E782 Mixed hyperlipidemia: Secondary | ICD-10-CM | POA: Diagnosis not present

## 2020-12-05 ENCOUNTER — Other Ambulatory Visit: Payer: Self-pay

## 2020-12-05 MED ORDER — ATORVASTATIN CALCIUM 40 MG PO TABS: 40.0000 mg | ORAL_TABLET | Freq: Every day | ORAL | 0 refills | Status: DC

## 2020-12-15 ENCOUNTER — Telehealth: Payer: Self-pay | Admitting: *Deleted

## 2020-12-15 NOTE — Telephone Encounter (Signed)
A message was left, re: his follow up visit. 

## 2021-05-04 ENCOUNTER — Other Ambulatory Visit: Payer: Self-pay | Admitting: Cardiology

## 2021-08-01 ENCOUNTER — Other Ambulatory Visit: Payer: Self-pay | Admitting: Cardiology

## 2021-08-25 ENCOUNTER — Other Ambulatory Visit: Payer: Self-pay | Admitting: Cardiology

## 2021-09-26 ENCOUNTER — Other Ambulatory Visit: Payer: Self-pay | Admitting: Cardiology

## 2021-10-06 ENCOUNTER — Encounter: Payer: Self-pay | Admitting: Cardiology

## 2021-12-11 DIAGNOSIS — E119 Type 2 diabetes mellitus without complications: Secondary | ICD-10-CM | POA: Diagnosis not present

## 2022-01-04 ENCOUNTER — Inpatient Hospital Stay (HOSPITAL_COMMUNITY)
Admission: EM | Admit: 2022-01-04 | Discharge: 2022-01-18 | DRG: 853 | Disposition: A | Discharge status: Hospice | Payer: Medicare Other | Attending: Internal Medicine | Admitting: Internal Medicine

## 2022-01-04 ENCOUNTER — Emergency Department (HOSPITAL_COMMUNITY): Payer: Medicare Other

## 2022-01-04 ENCOUNTER — Other Ambulatory Visit: Payer: Self-pay

## 2022-01-04 ENCOUNTER — Inpatient Hospital Stay (HOSPITAL_COMMUNITY): Payer: Medicare Other

## 2022-01-04 ENCOUNTER — Encounter (HOSPITAL_COMMUNITY): Payer: Self-pay | Admitting: Emergency Medicine

## 2022-01-04 DIAGNOSIS — D649 Anemia, unspecified: Secondary | ICD-10-CM | POA: Diagnosis not present

## 2022-01-04 DIAGNOSIS — L02611 Cutaneous abscess of right foot: Secondary | ICD-10-CM | POA: Diagnosis present

## 2022-01-04 DIAGNOSIS — E1140 Type 2 diabetes mellitus with diabetic neuropathy, unspecified: Secondary | ICD-10-CM | POA: Diagnosis present

## 2022-01-04 DIAGNOSIS — E872 Acidosis, unspecified: Secondary | ICD-10-CM | POA: Diagnosis present

## 2022-01-04 DIAGNOSIS — Z7189 Other specified counseling: Secondary | ICD-10-CM | POA: Diagnosis not present

## 2022-01-04 DIAGNOSIS — N390 Urinary tract infection, site not specified: Secondary | ICD-10-CM | POA: Diagnosis not present

## 2022-01-04 DIAGNOSIS — I083 Combined rheumatic disorders of mitral, aortic and tricuspid valves: Secondary | ICD-10-CM | POA: Diagnosis present

## 2022-01-04 DIAGNOSIS — M00072 Staphylococcal arthritis, left ankle and foot: Secondary | ICD-10-CM

## 2022-01-04 DIAGNOSIS — Z743 Need for continuous supervision: Secondary | ICD-10-CM | POA: Diagnosis not present

## 2022-01-04 DIAGNOSIS — R7881 Bacteremia: Secondary | ICD-10-CM | POA: Diagnosis not present

## 2022-01-04 DIAGNOSIS — K7030 Alcoholic cirrhosis of liver without ascites: Secondary | ICD-10-CM | POA: Diagnosis present

## 2022-01-04 DIAGNOSIS — Z7984 Long term (current) use of oral hypoglycemic drugs: Secondary | ICD-10-CM | POA: Diagnosis not present

## 2022-01-04 DIAGNOSIS — R45851 Suicidal ideations: Secondary | ICD-10-CM | POA: Diagnosis present

## 2022-01-04 DIAGNOSIS — Z87442 Personal history of urinary calculi: Secondary | ICD-10-CM

## 2022-01-04 DIAGNOSIS — Z66 Do not resuscitate: Secondary | ICD-10-CM | POA: Diagnosis not present

## 2022-01-04 DIAGNOSIS — M47812 Spondylosis without myelopathy or radiculopathy, cervical region: Secondary | ICD-10-CM | POA: Diagnosis not present

## 2022-01-04 DIAGNOSIS — Z6830 Body mass index (BMI) 30.0-30.9, adult: Secondary | ICD-10-CM

## 2022-01-04 DIAGNOSIS — Z7982 Long term (current) use of aspirin: Secondary | ICD-10-CM

## 2022-01-04 DIAGNOSIS — E871 Hypo-osmolality and hyponatremia: Secondary | ICD-10-CM | POA: Diagnosis not present

## 2022-01-04 DIAGNOSIS — M4644 Discitis, unspecified, thoracic region: Secondary | ICD-10-CM | POA: Diagnosis present

## 2022-01-04 DIAGNOSIS — K7460 Unspecified cirrhosis of liver: Secondary | ICD-10-CM | POA: Diagnosis not present

## 2022-01-04 DIAGNOSIS — D75838 Other thrombocytosis: Secondary | ICD-10-CM | POA: Diagnosis not present

## 2022-01-04 DIAGNOSIS — L03031 Cellulitis of right toe: Secondary | ICD-10-CM | POA: Diagnosis present

## 2022-01-04 DIAGNOSIS — M4624 Osteomyelitis of vertebra, thoracic region: Secondary | ICD-10-CM | POA: Diagnosis not present

## 2022-01-04 DIAGNOSIS — L89312 Pressure ulcer of right buttock, stage 2: Secondary | ICD-10-CM | POA: Diagnosis not present

## 2022-01-04 DIAGNOSIS — M7989 Other specified soft tissue disorders: Secondary | ICD-10-CM | POA: Diagnosis not present

## 2022-01-04 DIAGNOSIS — K828 Other specified diseases of gallbladder: Secondary | ICD-10-CM | POA: Diagnosis present

## 2022-01-04 DIAGNOSIS — M5126 Other intervertebral disc displacement, lumbar region: Secondary | ICD-10-CM | POA: Diagnosis not present

## 2022-01-04 DIAGNOSIS — J986 Disorders of diaphragm: Secondary | ICD-10-CM | POA: Diagnosis not present

## 2022-01-04 DIAGNOSIS — M86171 Other acute osteomyelitis, right ankle and foot: Secondary | ICD-10-CM | POA: Diagnosis not present

## 2022-01-04 DIAGNOSIS — M86671 Other chronic osteomyelitis, right ankle and foot: Secondary | ICD-10-CM | POA: Diagnosis not present

## 2022-01-04 DIAGNOSIS — E1165 Type 2 diabetes mellitus with hyperglycemia: Secondary | ICD-10-CM | POA: Diagnosis present

## 2022-01-04 DIAGNOSIS — I110 Hypertensive heart disease with heart failure: Secondary | ICD-10-CM | POA: Diagnosis present

## 2022-01-04 DIAGNOSIS — R7401 Elevation of levels of liver transaminase levels: Secondary | ICD-10-CM | POA: Diagnosis not present

## 2022-01-04 DIAGNOSIS — E1129 Type 2 diabetes mellitus with other diabetic kidney complication: Secondary | ICD-10-CM | POA: Diagnosis not present

## 2022-01-04 DIAGNOSIS — E669 Obesity, unspecified: Secondary | ICD-10-CM | POA: Diagnosis present

## 2022-01-04 DIAGNOSIS — G9341 Metabolic encephalopathy: Secondary | ICD-10-CM | POA: Diagnosis not present

## 2022-01-04 DIAGNOSIS — G061 Intraspinal abscess and granuloma: Secondary | ICD-10-CM | POA: Diagnosis present

## 2022-01-04 DIAGNOSIS — Z8249 Family history of ischemic heart disease and other diseases of the circulatory system: Secondary | ICD-10-CM

## 2022-01-04 DIAGNOSIS — S86012A Strain of left Achilles tendon, initial encounter: Secondary | ICD-10-CM | POA: Diagnosis not present

## 2022-01-04 DIAGNOSIS — B351 Tinea unguium: Secondary | ICD-10-CM | POA: Diagnosis present

## 2022-01-04 DIAGNOSIS — M7662 Achilles tendinitis, left leg: Secondary | ICD-10-CM | POA: Diagnosis not present

## 2022-01-04 DIAGNOSIS — L97519 Non-pressure chronic ulcer of other part of right foot with unspecified severity: Secondary | ICD-10-CM | POA: Diagnosis not present

## 2022-01-04 DIAGNOSIS — Z9989 Dependence on other enabling machines and devices: Secondary | ICD-10-CM | POA: Diagnosis not present

## 2022-01-04 DIAGNOSIS — R609 Edema, unspecified: Secondary | ICD-10-CM | POA: Diagnosis not present

## 2022-01-04 DIAGNOSIS — M869 Osteomyelitis, unspecified: Secondary | ICD-10-CM

## 2022-01-04 DIAGNOSIS — Z538 Procedure and treatment not carried out for other reasons: Secondary | ICD-10-CM | POA: Diagnosis not present

## 2022-01-04 DIAGNOSIS — I272 Pulmonary hypertension, unspecified: Secondary | ICD-10-CM | POA: Insufficient documentation

## 2022-01-04 DIAGNOSIS — L89322 Pressure ulcer of left buttock, stage 2: Secondary | ICD-10-CM | POA: Diagnosis not present

## 2022-01-04 DIAGNOSIS — I5031 Acute diastolic (congestive) heart failure: Secondary | ICD-10-CM | POA: Diagnosis not present

## 2022-01-04 DIAGNOSIS — F331 Major depressive disorder, recurrent, moderate: Secondary | ICD-10-CM | POA: Diagnosis present

## 2022-01-04 DIAGNOSIS — Z515 Encounter for palliative care: Secondary | ICD-10-CM

## 2022-01-04 DIAGNOSIS — E1169 Type 2 diabetes mellitus with other specified complication: Secondary | ICD-10-CM | POA: Diagnosis present

## 2022-01-04 DIAGNOSIS — E11621 Type 2 diabetes mellitus with foot ulcer: Secondary | ICD-10-CM | POA: Diagnosis not present

## 2022-01-04 DIAGNOSIS — E785 Hyperlipidemia, unspecified: Secondary | ICD-10-CM | POA: Diagnosis present

## 2022-01-04 DIAGNOSIS — G4733 Obstructive sleep apnea (adult) (pediatric): Secondary | ICD-10-CM | POA: Diagnosis not present

## 2022-01-04 DIAGNOSIS — Z888 Allergy status to other drugs, medicaments and biological substances status: Secondary | ICD-10-CM

## 2022-01-04 DIAGNOSIS — Z9852 Vasectomy status: Secondary | ICD-10-CM

## 2022-01-04 DIAGNOSIS — L089 Local infection of the skin and subcutaneous tissue, unspecified: Secondary | ICD-10-CM | POA: Diagnosis not present

## 2022-01-04 DIAGNOSIS — L0889 Other specified local infections of the skin and subcutaneous tissue: Secondary | ICD-10-CM | POA: Diagnosis not present

## 2022-01-04 DIAGNOSIS — N529 Male erectile dysfunction, unspecified: Secondary | ICD-10-CM | POA: Insufficient documentation

## 2022-01-04 DIAGNOSIS — R000 Tachycardia, unspecified: Secondary | ICD-10-CM | POA: Diagnosis not present

## 2022-01-04 DIAGNOSIS — E559 Vitamin D deficiency, unspecified: Secondary | ICD-10-CM | POA: Diagnosis present

## 2022-01-04 DIAGNOSIS — M47816 Spondylosis without myelopathy or radiculopathy, lumbar region: Secondary | ICD-10-CM | POA: Diagnosis not present

## 2022-01-04 DIAGNOSIS — J189 Pneumonia, unspecified organism: Secondary | ICD-10-CM | POA: Diagnosis present

## 2022-01-04 DIAGNOSIS — R6520 Severe sepsis without septic shock: Secondary | ICD-10-CM | POA: Diagnosis present

## 2022-01-04 DIAGNOSIS — M19071 Primary osteoarthritis, right ankle and foot: Secondary | ICD-10-CM | POA: Diagnosis not present

## 2022-01-04 DIAGNOSIS — A4101 Sepsis due to Methicillin susceptible Staphylococcus aureus: Secondary | ICD-10-CM | POA: Diagnosis not present

## 2022-01-04 DIAGNOSIS — J9811 Atelectasis: Secondary | ICD-10-CM | POA: Diagnosis not present

## 2022-01-04 DIAGNOSIS — M1A9XX1 Chronic gout, unspecified, with tophus (tophi): Secondary | ICD-10-CM | POA: Diagnosis present

## 2022-01-04 DIAGNOSIS — I10 Essential (primary) hypertension: Secondary | ICD-10-CM | POA: Diagnosis present

## 2022-01-04 DIAGNOSIS — M5033 Other cervical disc degeneration, cervicothoracic region: Secondary | ICD-10-CM | POA: Diagnosis not present

## 2022-01-04 DIAGNOSIS — N179 Acute kidney failure, unspecified: Secondary | ICD-10-CM | POA: Diagnosis not present

## 2022-01-04 DIAGNOSIS — M48061 Spinal stenosis, lumbar region without neurogenic claudication: Secondary | ICD-10-CM | POA: Diagnosis not present

## 2022-01-04 DIAGNOSIS — F1010 Alcohol abuse, uncomplicated: Secondary | ICD-10-CM | POA: Diagnosis not present

## 2022-01-04 DIAGNOSIS — Z872 Personal history of diseases of the skin and subcutaneous tissue: Secondary | ICD-10-CM | POA: Diagnosis not present

## 2022-01-04 DIAGNOSIS — M4319 Spondylolisthesis, multiple sites in spine: Secondary | ICD-10-CM | POA: Diagnosis not present

## 2022-01-04 DIAGNOSIS — D1800 Hemangioma unspecified site: Secondary | ICD-10-CM | POA: Diagnosis not present

## 2022-01-04 DIAGNOSIS — I959 Hypotension, unspecified: Secondary | ICD-10-CM | POA: Diagnosis not present

## 2022-01-04 DIAGNOSIS — Z20822 Contact with and (suspected) exposure to covid-19: Secondary | ICD-10-CM | POA: Diagnosis present

## 2022-01-04 DIAGNOSIS — B9561 Methicillin susceptible Staphylococcus aureus infection as the cause of diseases classified elsewhere: Secondary | ICD-10-CM

## 2022-01-04 DIAGNOSIS — Z7401 Bed confinement status: Secondary | ICD-10-CM | POA: Diagnosis not present

## 2022-01-04 DIAGNOSIS — L0390 Cellulitis, unspecified: Secondary | ICD-10-CM | POA: Diagnosis not present

## 2022-01-04 DIAGNOSIS — R531 Weakness: Secondary | ICD-10-CM | POA: Diagnosis not present

## 2022-01-04 DIAGNOSIS — S91104A Unspecified open wound of right lesser toe(s) without damage to nail, initial encounter: Secondary | ICD-10-CM | POA: Diagnosis not present

## 2022-01-04 DIAGNOSIS — E860 Dehydration: Secondary | ICD-10-CM | POA: Diagnosis present

## 2022-01-04 DIAGNOSIS — J984 Other disorders of lung: Secondary | ICD-10-CM | POA: Diagnosis not present

## 2022-01-04 DIAGNOSIS — S99921S Unspecified injury of right foot, sequela: Secondary | ICD-10-CM | POA: Diagnosis not present

## 2022-01-04 DIAGNOSIS — Z79899 Other long term (current) drug therapy: Secondary | ICD-10-CM

## 2022-01-04 DIAGNOSIS — J90 Pleural effusion, not elsewhere classified: Secondary | ICD-10-CM | POA: Diagnosis not present

## 2022-01-04 DIAGNOSIS — M109 Gout, unspecified: Secondary | ICD-10-CM | POA: Diagnosis present

## 2022-01-04 DIAGNOSIS — L8990 Pressure ulcer of unspecified site, unspecified stage: Secondary | ICD-10-CM | POA: Insufficient documentation

## 2022-01-04 DIAGNOSIS — R188 Other ascites: Secondary | ICD-10-CM | POA: Diagnosis not present

## 2022-01-04 DIAGNOSIS — L97509 Non-pressure chronic ulcer of other part of unspecified foot with unspecified severity: Secondary | ICD-10-CM | POA: Diagnosis present

## 2022-01-04 DIAGNOSIS — S99921A Unspecified injury of right foot, initial encounter: Secondary | ICD-10-CM | POA: Diagnosis present

## 2022-01-04 DIAGNOSIS — I350 Nonrheumatic aortic (valve) stenosis: Secondary | ICD-10-CM | POA: Diagnosis not present

## 2022-01-04 DIAGNOSIS — M86179 Other acute osteomyelitis, unspecified ankle and foot: Secondary | ICD-10-CM | POA: Diagnosis not present

## 2022-01-04 DIAGNOSIS — R29898 Other symptoms and signs involving the musculoskeletal system: Secondary | ICD-10-CM | POA: Diagnosis not present

## 2022-01-04 LAB — BASIC METABOLIC PANEL
Anion gap: 15 (ref 5–15)
Anion gap: 13 (ref 5–15)
Anion gap: 14 (ref 5–15)
BUN: 61 mg/dL — ABNORMAL HIGH (ref 8–23)
BUN: 60 mg/dL — ABNORMAL HIGH (ref 8–23)
BUN: 59 mg/dL — ABNORMAL HIGH (ref 8–23)
CO2: 23 mmol/L (ref 22–32)
CO2: 23 mmol/L (ref 22–32)
CO2: 20 mmol/L — ABNORMAL LOW (ref 22–32)
Calcium: 8.1 mg/dL — ABNORMAL LOW (ref 8.9–10.3)
Calcium: 7.8 mg/dL — ABNORMAL LOW (ref 8.9–10.3)
Calcium: 7.9 mg/dL — ABNORMAL LOW (ref 8.9–10.3)
Chloride: 84 mmol/L — ABNORMAL LOW (ref 98–111)
Chloride: 83 mmol/L — ABNORMAL LOW (ref 98–111)
Chloride: 87 mmol/L — ABNORMAL LOW (ref 98–111)
Creatinine, Ser: 2.06 mg/dL — ABNORMAL HIGH (ref 0.61–1.24)
Creatinine, Ser: 1.87 mg/dL — ABNORMAL HIGH (ref 0.61–1.24)
Creatinine, Ser: 1.89 mg/dL — ABNORMAL HIGH (ref 0.61–1.24)
GFR, Estimated: 34 mL/min — ABNORMAL LOW (ref >60)
GFR, Estimated: 38 mL/min — ABNORMAL LOW (ref >60)
GFR, Estimated: 38 mL/min — ABNORMAL LOW (ref >60)
Glucose, Bld: 147 mg/dL — ABNORMAL HIGH (ref 70–99)
Glucose, Bld: 104 mg/dL — ABNORMAL HIGH (ref 70–99)
Glucose, Bld: 105 mg/dL — ABNORMAL HIGH (ref 70–99)
Potassium: 4.6 mmol/L (ref 3.5–5.1)
Potassium: 4.2 mmol/L (ref 3.5–5.1)
Potassium: 4.3 mmol/L (ref 3.5–5.1)
Sodium: 122 mmol/L — ABNORMAL LOW (ref 135–145)
Sodium: 119 mmol/L — CL (ref 135–145)
Sodium: 121 mmol/L — ABNORMAL LOW (ref 135–145)

## 2022-01-04 LAB — GLUCOSE, CAPILLARY
Glucose-Capillary: 127 mg/dL — ABNORMAL HIGH (ref 70–99)
Glucose-Capillary: 100 mg/dL — ABNORMAL HIGH (ref 70–99)

## 2022-01-04 LAB — COMPREHENSIVE METABOLIC PANEL
ALT: 70 U/L — ABNORMAL HIGH (ref 0–44)
AST: 67 U/L — ABNORMAL HIGH (ref 15–41)
Albumin: 2.3 g/dL — ABNORMAL LOW (ref 3.5–5.0)
Alkaline Phosphatase: 127 U/L — ABNORMAL HIGH (ref 38–126)
Anion gap: 16 — ABNORMAL HIGH (ref 5–15)
BUN: 63 mg/dL — ABNORMAL HIGH (ref 8–23)
CO2: 21 mmol/L — ABNORMAL LOW (ref 22–32)
Calcium: 8.1 mg/dL — ABNORMAL LOW (ref 8.9–10.3)
Chloride: 81 mmol/L — ABNORMAL LOW (ref 98–111)
Creatinine, Ser: 2.2 mg/dL — ABNORMAL HIGH (ref 0.61–1.24)
GFR, Estimated: 31 mL/min — ABNORMAL LOW (ref >60)
Glucose, Bld: 190 mg/dL — ABNORMAL HIGH (ref 70–99)
Potassium: 4.8 mmol/L (ref 3.5–5.1)
Sodium: 118 mmol/L — CL (ref 135–145)
Total Bilirubin: 1.2 mg/dL (ref 0.3–1.2)
Total Protein: 7 g/dL (ref 6.5–8.1)

## 2022-01-04 LAB — URINALYSIS, ROUTINE W REFLEX MICROSCOPIC
Bilirubin Urine: NEGATIVE
Glucose, UA: NEGATIVE mg/dL
Ketones, ur: NEGATIVE mg/dL
Nitrite: NEGATIVE
Protein, ur: 30 mg/dL — AB
RBC / HPF: >50 RBC/hpf — ABNORMAL HIGH (ref 0–5)
Specific Gravity, Urine: 1.014 (ref 1.005–1.030)
pH: 5 (ref 5.0–8.0)

## 2022-01-04 LAB — CBC WITH DIFFERENTIAL/PLATELET
Abs Immature Granulocytes: 0.31 10*3/uL — ABNORMAL HIGH (ref 0.00–0.07)
Basophils Absolute: 0.1 10*3/uL (ref 0.0–0.1)
Basophils Relative: 1 %
Eosinophils Absolute: 0 10*3/uL (ref 0.0–0.5)
Eosinophils Relative: 0 %
HCT: 40.2 % (ref 39.0–52.0)
Hemoglobin: 14.3 g/dL (ref 13.0–17.0)
Immature Granulocytes: 1 %
Lymphocytes Relative: 2 %
Lymphs Abs: 0.5 10*3/uL — ABNORMAL LOW (ref 0.7–4.0)
MCH: 30.5 pg (ref 26.0–34.0)
MCHC: 35.6 g/dL (ref 30.0–36.0)
MCV: 85.7 fL (ref 80.0–100.0)
Monocytes Absolute: 0.8 10*3/uL (ref 0.1–1.0)
Monocytes Relative: 3 %
Neutro Abs: 29.2 10*3/uL — ABNORMAL HIGH (ref 1.7–7.7)
Neutrophils Relative %: 93 %
Platelets: 541 10*3/uL — ABNORMAL HIGH (ref 150–400)
RBC: 4.69 MIL/uL (ref 4.22–5.81)
RDW: 13.9 % (ref 11.5–15.5)
WBC: 31 10*3/uL — ABNORMAL HIGH (ref 4.0–10.5)
nRBC: 0 % (ref 0.0–0.2)

## 2022-01-04 LAB — MRSA NEXT GEN BY PCR, NASAL: MRSA by PCR Next Gen: NOT DETECTED

## 2022-01-04 LAB — PROTIME-INR
INR: 2.2 — ABNORMAL HIGH (ref 0.8–1.2)
Prothrombin Time: 24.6 seconds — ABNORMAL HIGH (ref 11.4–15.2)

## 2022-01-04 LAB — STREP PNEUMONIAE URINARY ANTIGEN: Strep Pneumo Urinary Antigen: NEGATIVE

## 2022-01-04 LAB — RESP PANEL BY RT-PCR (FLU A&B, COVID) ARPGX2
Influenza A by PCR: NEGATIVE
Influenza B by PCR: NEGATIVE
SARS Coronavirus 2 by RT PCR: NEGATIVE

## 2022-01-04 LAB — MAGNESIUM: Magnesium: 2 mg/dL (ref 1.7–2.4)

## 2022-01-04 LAB — HEMOGLOBIN A1C
Hgb A1c MFr Bld: 6.3 % — ABNORMAL HIGH (ref 4.8–5.6)
Mean Plasma Glucose: 134.11 mg/dL

## 2022-01-04 LAB — APTT: aPTT: 47 seconds — ABNORMAL HIGH (ref 24–36)

## 2022-01-04 LAB — PHOSPHORUS: Phosphorus: 3.5 mg/dL (ref 2.5–4.6)

## 2022-01-04 LAB — LACTIC ACID, PLASMA
Lactic Acid, Venous: 3.2 mmol/L (ref 0.5–1.9)
Lactic Acid, Venous: 2 mmol/L (ref 0.5–1.9)

## 2022-01-04 IMAGING — DX DG TOE 2ND 2+V*R*
2 series · 2 of 2 positions shown · non-contrast
Comparison: None.

CLINICAL DATA: Wound, cellulitis.

EXAM:
RIGHT SECOND TOE

[toe ap]
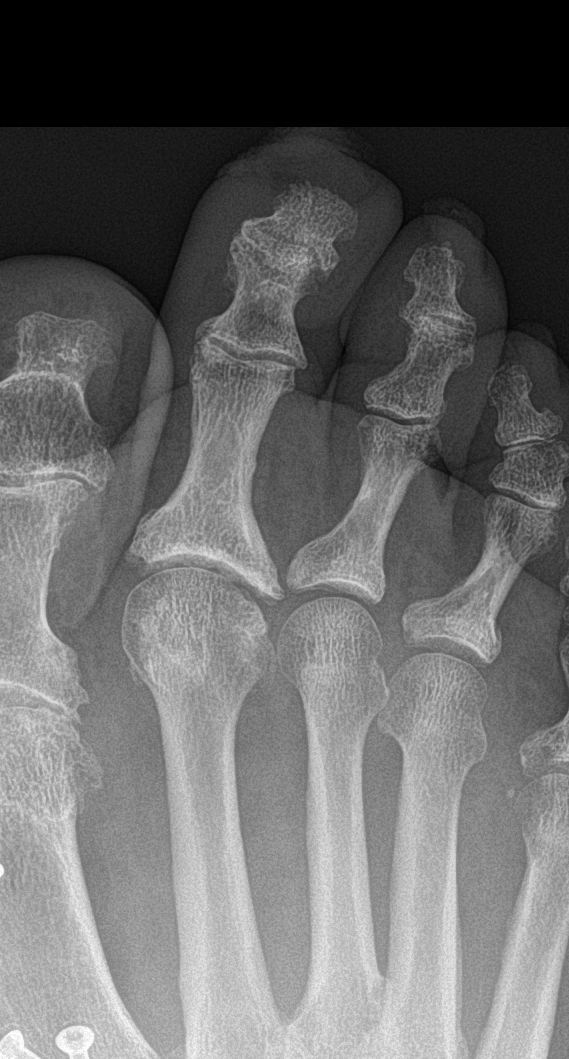

[toe lat]
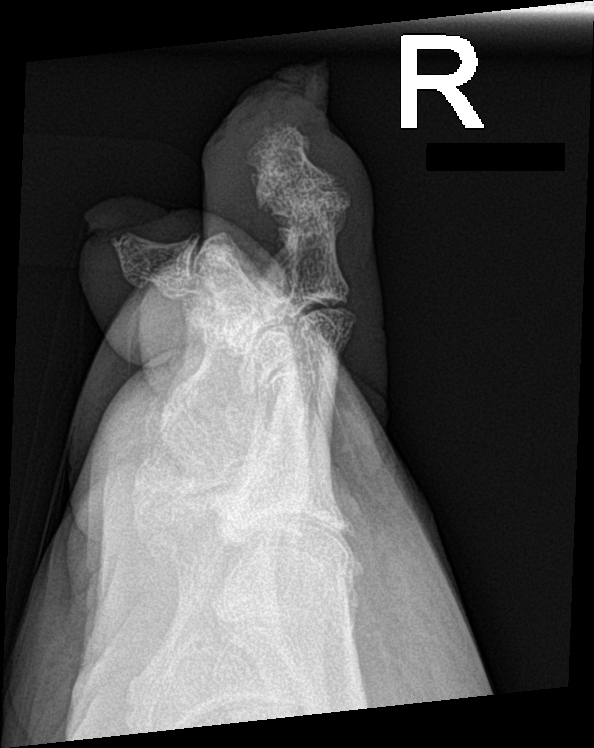

[2 of 2 positions shown; findings below may reference images not displayed]

FINDINGS: Skin and soft tissue irregularity about the distal digit. Indistinct
cortex of the distal tuft of the second distal phalanx. There is
osteoarthritis of the digit. No tracking soft tissue air. No
radiopaque foreign body.
IMPRESSION: Skin and soft tissue irregularity about the distal digit. Indistinct
cortex of the distal tuft of the second distal phalanx suspicious
for osteomyelitis.

## 2022-01-04 IMAGING — DX DG CHEST 1V PORT
1 series · 1 of 1 positions shown · non-contrast
Comparison: None.

CLINICAL DATA: Question sepsis

EXAM:
PORTABLE CHEST 1 VIEW

[chest ap]
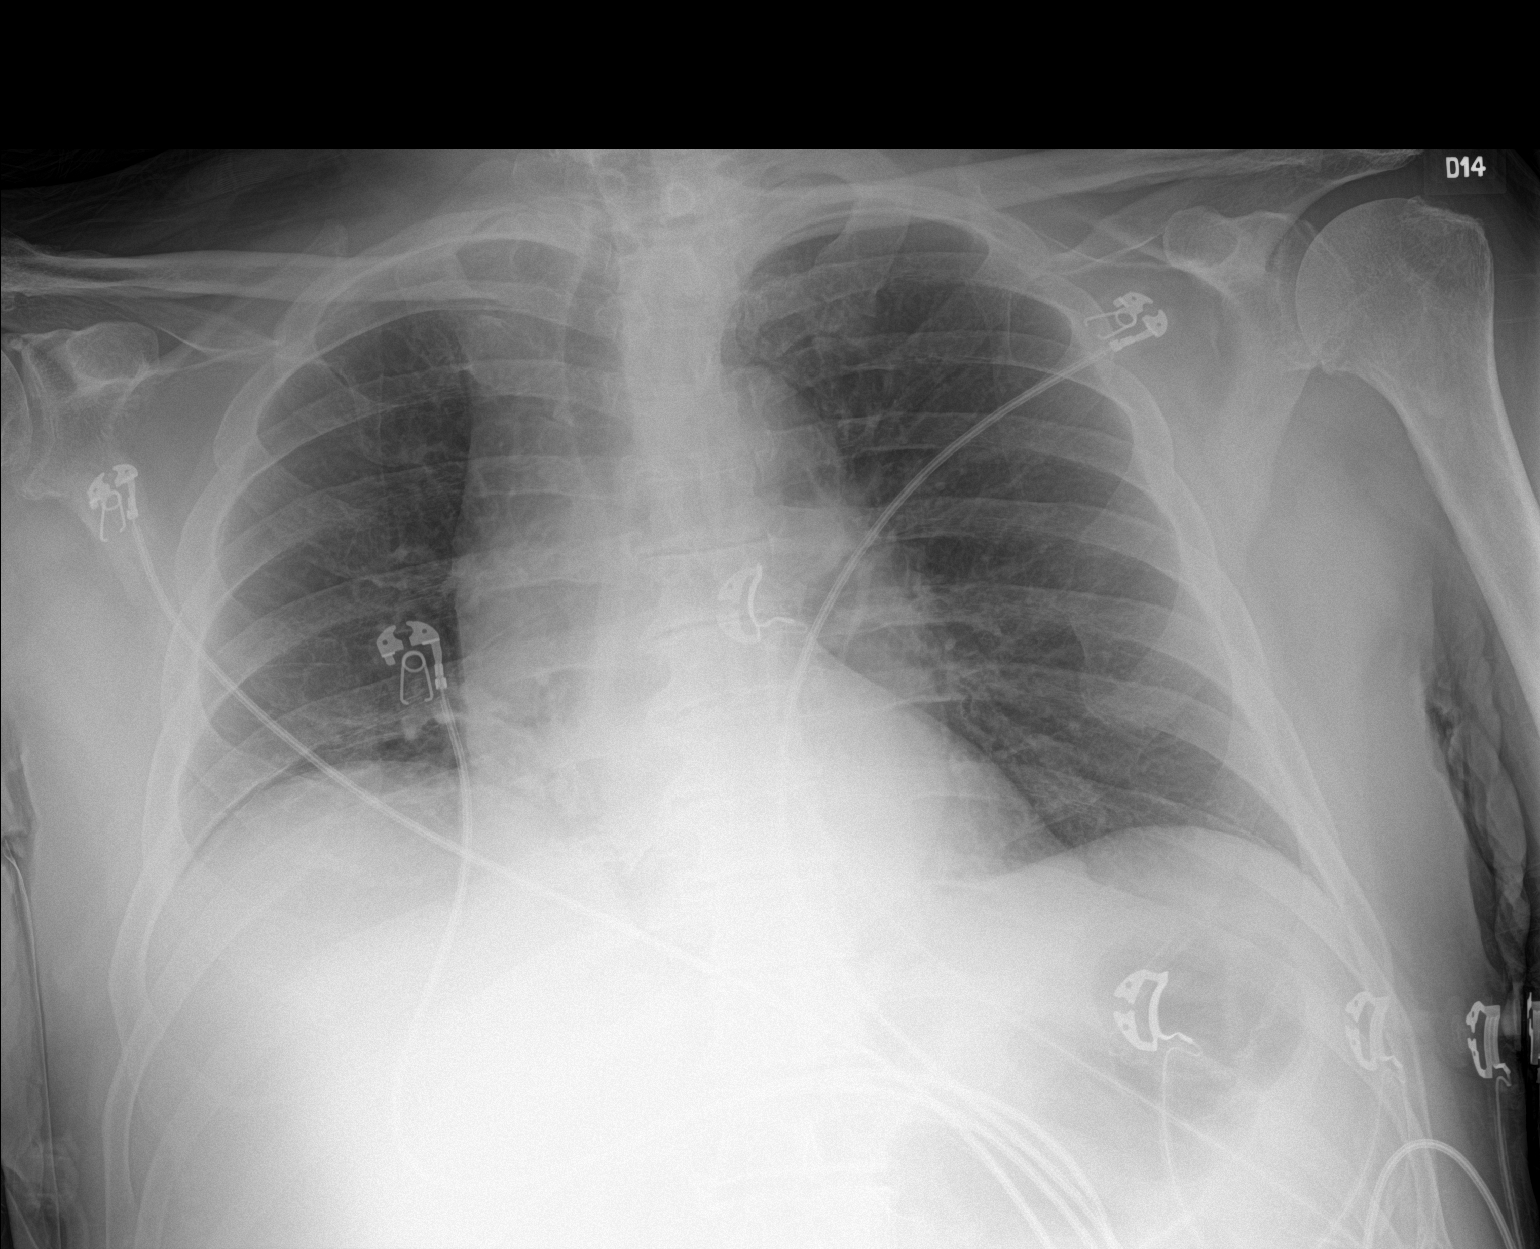

[1 of 1 positions shown; findings below may reference images not displayed]

FINDINGS: Elevated right hemidiaphragm with right lower lobe atelectasis. Left
lung clear. Decreased lung volumes.

Negative for heart failure. No pleural effusion. Heart size and
vascularity normal.
IMPRESSION: Hypoventilation. Elevated right hemidiaphragm with right lower lobe
airspace disease consistent with atelectasis.

## 2022-01-04 MED ORDER — ONDANSETRON HCL 4 MG/2ML IJ SOLN
4.0000 mg | Freq: Four times a day (QID) | INTRAMUSCULAR | Status: DC | PRN
Start: 2022-01-04 — End: 2022-01-18

## 2022-01-04 MED ORDER — ACETAMINOPHEN 325 MG PO TABS
650.0000 mg | ORAL_TABLET | Freq: Four times a day (QID) | ORAL | Status: DC | PRN
  Administered 2022-01-04 – 2022-01-15 (×13): 650 mg via ORAL
  Filled 2022-01-04 (×14): qty 2

## 2022-01-04 MED ORDER — CHLORHEXIDINE GLUCONATE CLOTH 2 % EX PADS
6.0000 | MEDICATED_PAD | Freq: Every day | CUTANEOUS | Status: DC
  Administered 2022-01-04 – 2022-01-15 (×12): 6 via TOPICAL

## 2022-01-04 MED ORDER — IPRATROPIUM BROMIDE 0.02 % IN SOLN: 0.5000 mg | RESPIRATORY_TRACT | Status: DC | PRN

## 2022-01-04 MED ORDER — SODIUM CHLORIDE 0.9 % IV SOLN: INTRAVENOUS | Status: DC

## 2022-01-04 MED ORDER — THIAMINE HCL 100 MG PO TABS
100.0000 mg | ORAL_TABLET | Freq: Every day | ORAL | Status: DC
  Administered 2022-01-04 – 2022-01-14 (×10): 100 mg via ORAL
  Filled 2022-01-04 (×10): qty 1

## 2022-01-04 MED ORDER — FOLIC ACID 1 MG PO TABS
1.0000 mg | ORAL_TABLET | Freq: Every day | ORAL | Status: DC
  Administered 2022-01-04 – 2022-01-15 (×12): 1 mg via ORAL
  Filled 2022-01-04 (×12): qty 1

## 2022-01-04 MED ORDER — SODIUM CHLORIDE 0.9 % IV SOLN
500.0000 mg | Freq: Once | INTRAVENOUS | Status: AC
  Administered 2022-01-04: 500 mg via INTRAVENOUS
  Filled 2022-01-04: qty 5

## 2022-01-04 MED ORDER — INSULIN ASPART 100 UNIT/ML IJ SOLN
0.0000 [IU] | Freq: Three times a day (TID) | INTRAMUSCULAR | Status: DC
  Administered 2022-01-04: 3 [IU] via SUBCUTANEOUS
  Administered 2022-01-04 – 2022-01-06 (×2): 2 [IU] via SUBCUTANEOUS
  Administered 2022-01-07: 3 [IU] via SUBCUTANEOUS
  Administered 2022-01-07 (×2): 5 [IU] via SUBCUTANEOUS
  Administered 2022-01-08: 2 [IU] via SUBCUTANEOUS
  Administered 2022-01-08: 3 [IU] via SUBCUTANEOUS
  Administered 2022-01-09 – 2022-01-11 (×5): 2 [IU] via SUBCUTANEOUS
  Administered 2022-01-12 – 2022-01-13 (×2): 3 [IU] via SUBCUTANEOUS
  Administered 2022-01-13: 2 [IU] via SUBCUTANEOUS
  Administered 2022-01-14: 3 [IU] via SUBCUTANEOUS
  Filled 2022-01-04: qty 0.15

## 2022-01-04 MED ORDER — METHOCARBAMOL 500 MG PO TABS
750.0000 mg | ORAL_TABLET | Freq: Four times a day (QID) | ORAL | Status: DC | PRN
  Administered 2022-01-04 – 2022-01-14 (×13): 750 mg via ORAL
  Filled 2022-01-04 (×14): qty 2

## 2022-01-04 MED ORDER — LORAZEPAM 1 MG PO TABS
1.0000 mg | ORAL_TABLET | ORAL | Status: AC | PRN
  Administered 2022-01-06: 1 mg via ORAL
  Filled 2022-01-04: qty 1

## 2022-01-04 MED ORDER — CEFTRIAXONE SODIUM 2 G IJ SOLR
2.0000 g | Freq: Once | INTRAMUSCULAR | Status: AC
  Administered 2022-01-04: 2 g via INTRAVENOUS
  Filled 2022-01-04: qty 20

## 2022-01-04 MED ORDER — ADULT MULTIVITAMIN W/MINERALS CH
1.0000 | ORAL_TABLET | Freq: Every day | ORAL | Status: DC
  Administered 2022-01-04 – 2022-01-15 (×12): 1 via ORAL
  Filled 2022-01-04 (×12): qty 1

## 2022-01-04 MED ORDER — AMLODIPINE BESYLATE 10 MG PO TABS
10.0000 mg | ORAL_TABLET | Freq: Every day | ORAL | Status: DC
  Administered 2022-01-04 – 2022-01-14 (×11): 10 mg via ORAL
  Filled 2022-01-04 (×11): qty 1

## 2022-01-04 MED ORDER — OXYCODONE HCL 5 MG PO TABS
5.0000 mg | ORAL_TABLET | ORAL | Status: DC | PRN
  Administered 2022-01-04 – 2022-01-08 (×12): 5 mg via ORAL
  Filled 2022-01-04 (×12): qty 1

## 2022-01-04 MED ORDER — SODIUM CHLORIDE 0.9 % IV SOLN: 500.0000 mg | INTRAVENOUS | Status: DC

## 2022-01-04 MED ORDER — PROBENECID 500 MG PO TABS
500.0000 mg | ORAL_TABLET | Freq: Two times a day (BID) | ORAL | Status: DC
  Administered 2022-01-04 – 2022-01-14 (×21): 500 mg via ORAL
  Filled 2022-01-04 (×23): qty 1

## 2022-01-04 MED ORDER — OXYCODONE HCL 5 MG PO TABS
5.0000 mg | ORAL_TABLET | Freq: Once | ORAL | Status: AC
  Administered 2022-01-04: 5 mg via ORAL
  Filled 2022-01-04: qty 1

## 2022-01-04 MED ORDER — ACETAMINOPHEN 650 MG RE SUPP: 650.0000 mg | Freq: Four times a day (QID) | RECTAL | Status: DC | PRN

## 2022-01-04 MED ORDER — COLCHICINE-PROBENECID 0.5-500 MG PO TABS: 1.0000 | ORAL_TABLET | Freq: Two times a day (BID) | ORAL | Status: DC

## 2022-01-04 MED ORDER — ACETAMINOPHEN 500 MG PO TABS
1000.0000 mg | ORAL_TABLET | Freq: Once | ORAL | Status: AC
  Administered 2022-01-04: 1000 mg via ORAL
  Filled 2022-01-04: qty 2

## 2022-01-04 MED ORDER — SODIUM CHLORIDE 0.9 % IV BOLUS
1000.0000 mL | Freq: Once | INTRAVENOUS | Status: AC
  Administered 2022-01-04: 1000 mL via INTRAVENOUS

## 2022-01-04 MED ORDER — THIAMINE HCL 100 MG/ML IJ SOLN
100.0000 mg | Freq: Every day | INTRAMUSCULAR | Status: DC
  Administered 2022-01-06: 100 mg via INTRAVENOUS
  Filled 2022-01-04: qty 2

## 2022-01-04 MED ORDER — CYCLOBENZAPRINE HCL 5 MG PO TABS: 5.0000 mg | ORAL_TABLET | Freq: Three times a day (TID) | ORAL | Status: DC | PRN

## 2022-01-04 MED ORDER — LORAZEPAM 2 MG/ML IJ SOLN: 0.0000 mg | INTRAMUSCULAR | Status: AC

## 2022-01-04 MED ORDER — ONDANSETRON HCL 4 MG PO TABS: 4.0000 mg | ORAL_TABLET | Freq: Four times a day (QID) | ORAL | Status: DC | PRN

## 2022-01-04 MED ORDER — LORAZEPAM 2 MG/ML IJ SOLN: 0.0000 mg | Freq: Three times a day (TID) | INTRAMUSCULAR | Status: AC

## 2022-01-04 MED ORDER — LORAZEPAM 2 MG/ML IJ SOLN
1.0000 mg | INTRAMUSCULAR | Status: AC | PRN
  Administered 2022-01-06: 2 mg via INTRAVENOUS
  Filled 2022-01-04: qty 1

## 2022-01-04 MED ORDER — SODIUM CHLORIDE 0.9 % IV SOLN: 1.0000 g | INTRAVENOUS | Status: DC

## 2022-01-04 MED ORDER — COLCHICINE 0.3 MG HALF TABLET
0.3000 mg | ORAL_TABLET | Freq: Two times a day (BID) | ORAL | Status: DC
  Administered 2022-01-04 – 2022-01-14 (×21): 0.3 mg via ORAL
  Filled 2022-01-04 (×24): qty 1

## 2022-01-04 MED ORDER — ALBUTEROL SULFATE (2.5 MG/3ML) 0.083% IN NEBU: 2.5000 mg | INHALATION_SOLUTION | RESPIRATORY_TRACT | Status: DC | PRN

## 2022-01-04 NOTE — ED Triage Notes (Signed)
Pt  to ER via EMS from home.  Pt c/o gout flare for last 2-3 weeks.  Pt has been out of hydrocodone for last week.  Pt also reports decreased appetite and decreased PO intake.  Pt having difficulty walking due to the pain.  Pt reports weakness at this time. ?

## 2022-01-04 NOTE — H&P (Signed)
?History and Physical  ? ? ?Patient: Vincent White. [id number] DOB: May 25, 1951 ?DOA: 01/04/2022 ?DOS: the patient was seen and examined on 01/04/2022 ?PCP: Leighton Ruff, MD (Inactive)  ?Patient coming from: Home ? ?Chief Complaint:  ?Chief Complaint  ?Patient presents with  ? Knee Pain  ? ?HPI: Vincent White. is a 71 y.o. male with medical history significant of cervical spondylosis, gout, class I obesity, nephrolithiasis, hyperlipidemia, hypertension, obstructive sleep apnea on CPAP, vitamin D deficiency, mitral valve insufficiency, mild pulmonary hypertension, type 2 diabetes who is coming to the emergency department due to having a gout flare for the past 2 to 3 weeks associated with generalized weakness, decreased mobility, decreased appetite and overall decreased oral intake.  He took tart cherry juice 2 to 3 days ago because he heard that this helps with gout.  However, he had an episode of emesis and multiple episodes of diarrhea following this.  Deny melena, hematochezia, flank pain, dysuria, frequency or hematuria.  He does not think he has been urinating less than usual.  He has only been drinking apple cider 6 to 12 cans daily, which have 5% alcohol content.  He recently injured his second toe while cutting his nails.  He denies fever, chills, sore throat, rhinorrhea, dyspnea, productive cough, hemoptysis, chest pain, palpitations, diaphoresis, orthopnea or PND.  He has had occasional lower extremity edema.  No polyuria, polydipsia, polyphagia or blurred vision. ? ?ED course: Initial vital signs were temperature 99.4 ?F, pulse 108, respirations 31, BP 124/78 mmHg and O2 sat 94% on room air.  The patient received acetaminophen 1000 mg p.o. x1, azithromycin 500 mg IVPB, ceftriaxone 1 g IVPB, oxycodone 5 mg p.o. x1 and 1000 mL of NaCl bolus. ? ?Lab work: CBC is her white count 31.0, hemoglobin 14.3 g/dL platelets 541.  PT 24.6, INR 2.2 and PTT 47.  CMP showed a sodium 118, potassium 4.8, chloride 81 and  CO2 21 mmol/L.  Anion gap was 16.  Glucose 119, BUN 63, creatinine 2.20 and calcium 8.1 mg/dL.  Total protein 7.0 and albumin 2.3 g/dL.  AST 67, ALT 70 and alkaline phosphatase 127 units/L.  Total bilirubin was normal.  Lactic acid was 3.2 and then 2.0 mmol/L. ? ?Imaging: One-view portable chest radiograph show hypoventilation with elevated right hemidiaphragm with right lower lobe airspace disease likely consistent with atelectasis. ? ?Review of Systems: As mentioned in the history of present illness. All other systems reviewed and are negative. ?Past Medical History:  ?Diagnosis Date  ? Cervical spondylosis   ? Gout   ? History of kidney stones   ? HLD (hyperlipidemia)   ? Hypertension   ? OSA (obstructive sleep apnea)   ? CPAP  ? Vitamin D deficiency   ? ?Past Surgical History:  ?Procedure Laterality Date  ? bone spur surgery  02/05/2017  ? mass removed on left elbow  ? BUNIONECTOMY Right 2014  ? SHOULDER SURGERY Right   ? thumb surgery Right   ? VASECTOMY    ? ?Social History:  reports that he has never smoked. He has never used smokeless tobacco. He reports current alcohol use of about 1.0 standard drink per week. He reports that he does not use drugs. ? ?Allergies  ?Allergen Reactions  ? Allopurinol Other (See Comments)  ?  Dehydrated   ? Flexeril [Cyclobenzaprine] Other (See Comments)  ?  confusion  ? Nasal Spray Other (See Comments)  ?  Elevated BP  ? ? ?Family History  ?Problem Relation Age of Onset  ?  Cancer - Other Mother   ?     bone  ? Diabetes Mother   ? Heart attack Father 36  ? Diabetes Father   ? Hypertension Father   ? Cancer - Other Sister   ?     breast  ? Gout Brother   ? Hypertension Maternal Grandmother   ? Hypertension Maternal Grandfather   ? Colon cancer Neg Hx   ? Liver disease Neg Hx   ? Kidney disease Neg Hx   ? ? ?Prior to Admission medications   ?Medication Sig Start Date End Date Taking? Authorizing Provider  ?acetaminophen (TYLENOL) 500 MG tablet Take 1,000 mg by mouth every 6 (six)  hours as needed for mild pain.   Yes [provider]  ?amLODipine (NORVASC) 10 MG tablet Take 10 mg by mouth daily. 05/09/18  Yes [provider]  ?atorvastatin (LIPITOR) 40 MG tablet TAKE 1 TABLET (40 MG TOTAL) BY MOUTH DAILY. PLEASE SCHEDULE OFFICE VISIT FOR REFILLS. 09/26/21  Yes Minus Breeding, MD  ?Celecoxib (CELEBREX PO) Take 200 mg by mouth daily as needed (pain).   Yes [provider]  ?Cholecalciferol 2000 units CAPS Take 2,000 Units by mouth daily.   Yes [provider]  ?losartan (COZAAR) 100 MG tablet Take 100 mg by mouth at bedtime. 06/01/18  Yes [provider]  ?metFORMIN (GLUCOPHAGE) 500 MG tablet Take 500 mg by mouth 2 (two) times daily with a meal.   Yes [provider]  ?Sodium Bicarbonate-Citric Acid (ALKA-SELTZER HEARTBURN) 1940-1000 MG TBEF Take 2 tablets by mouth every 6 (six) hours as needed (heartburn).   Yes [provider]  ?aspirin 81 MG tablet Take 81 mg by mouth daily. ?Patient not taking: Reported on 01/04/2022    [provider]  ?cyclobenzaprine (FLEXERIL) 5 MG tablet Take 5 mg by mouth at bedtime as needed for muscle spasms. ?Patient not taking: Reported on 01/04/2022 12/27/21   [provider]  ?HYDROcodone-acetaminophen (NORCO) 10-325 MG per tablet Take 1 tablet by mouth 2 (two) times daily. ?Patient not taking: Reported on 01/04/2022    [provider]  ? ? ?Physical Exam: ?Vitals:  ? 01/04/22 1130 01/04/22 1200 01/04/22 1230 01/04/22 1300  ?BP: 127/78 140/89 137/85 131/85  ?Pulse: (!) 104 (!) 102 (!) 105 100  ?Resp: (!) 34 (!) 31 (!) 33 (!) 30  ?Temp:      ?TempSrc:      ?SpO2: 94% 94% 93% 93%  ?Weight:      ?Height:      ? ?Physical Exam ?Vitals and nursing note reviewed.  ?Constitutional:   ?   Appearance: He is obese.  ?HENT:  ?   Head: Normocephalic.  ?   Mouth/Throat:  ?   Mouth: Mucous membranes are moist.  ?Eyes:  ?   General: No scleral icterus. ?   Pupils: Pupils are equal, round, and  reactive to light.  ?Neck:  ?   Vascular: No JVD.  ?Cardiovascular:  ?   Rate and Rhythm: Regular rhythm. Tachycardia present.  ?   Heart sounds: S1 normal and S2 normal.  ?   Comments: Trace lower extremity edema. ?Abdominal:  ?   General: There is distension.  ?   Palpations: Abdomen is soft.  ?Musculoskeletal:  ?   Right hand: Swelling present.  ?   Left hand: Swelling present.  ?   Cervical back: Neck supple.  ?   Right knee: Swelling present. Decreased range of motion.  ?   Left  knee: Swelling present. Decreased range of motion.  ?   Comments: Moderate to severe generalized weakness.  ?Skin: ?   General: Skin is warm and dry.  ?   Coloration: Skin is not jaundiced.  ?   Findings: Erythema and rash present.  ?   Comments: See pictures below.  ?Neurological:  ?   General: No focal deficit present.  ?   Mental Status: He is alert and oriented to person, place, and time.  ?Psychiatric:     ?   Mood and Affect: Mood normal.     ?   Behavior: Behavior normal.  ? ? ? ? ? ?Data Reviewed: ? ?There are no new results to review at this time. ? ?Assessment and Plan: ?Principal Problem: ?  Hyponatremia ?Secondary to GI losses/apple cider drinking. ?Free water restriction. ?Continue isotonic IV fluids. ?Check urine NA, K and osmolality. ?Monitor intake and output. ?Follow-up sodium level closely. ? ?Active Problems: ?  AKI (acute kidney injury) ([place]) ?Continue IV fluids. ?Hold ARB. ?Monitor intake and output. ?Avoid nephrotoxins. ?Avoid hypotension. ?Follow-up renal function electrolytes. ? ?  CAP (community acquired pneumonia) ?Supplemental oxygen as needed. ?Bronchodilators as needed. ?Continue ceftriaxone 1 g IVPB daily. ?Continue azithromycin 500 mg IVPB daily. ?Check strep pneumoniae urinary antigen. ?Check Legionella urinary antigen. ?Follow-up blood culture and sensitivity. ? ?  OSA (obstructive sleep apnea) ?CPAP at bedtime. ? ?  Injury of right second toe ?Check right toe to x-ray. ?Further work-up depending on  results. ? ?  Essential hypertension ?Hold losartan. ?Continue amlodipine 10 mg p.o. daily. ?Monitor BP, HR, renal function electrolytes. ? ?  Dyslipidemia ?Hold atorvastatin due to generalized weakness. ? ?  T

## 2022-01-04 NOTE — ED Provider Notes (Signed)
?[name] ?Provider Note ? ? ?CSN: [id number] ?Arrival date & time: 01/04/22  1016 ? ?  ? ?History ? ?Chief Complaint  ?Patient presents with  ? Knee Pain  ? ? ?Vincent White. is a 71 y.o. male. ? ?71 yo M with a chief complaints of leg pain.  He tells me he felt like he got gout in his left foot and now is moved to both of his knees.  He has also not felt really well.  He has trouble describing this.  Tells me that last night he felt he is having a bit of trouble breathing.  He otherwise denies cough congestion denies fevers chills.  Feels like he can have aches all over.  He has some bruising to his right leg that he said was from a fall earlier in the week. ? ? ?Knee Pain ? ?  ? ?Home Medications ?Prior to Admission medications   ?Medication Sig Start Date End Date Taking? Authorizing Provider  ?amLODipine (NORVASC) 5 MG tablet Take 5 mg by mouth 2 (two) times daily.  05/09/18   [provider]  ?aspirin 81 MG tablet Take 81 mg by mouth daily.    [provider]  ?atorvastatin (LIPITOR) 40 MG tablet TAKE 1 TABLET (40 MG TOTAL) BY MOUTH DAILY. PLEASE SCHEDULE OFFICE VISIT FOR REFILLS. 09/26/21   Minus Breeding, MD  ?Celecoxib (CELEBREX PO) Take by mouth. Take 1-2 capsules by mouth daily as needed.    [provider]  ?Cholecalciferol 2000 units CAPS Take 1 capsule by mouth daily.    [provider]  ?CIALIS 20 MG tablet  09/12/13   [provider]  ?HYDROcodone-acetaminophen ([place]) 10-325 MG per tablet Take 1 tablet by mouth 2 (two) times daily.    [provider]  ?losartan (COZAAR) 100 MG tablet Take 100 mg by mouth daily. 06/01/18   [provider]  ?metFORMIN (GLUCOPHAGE) 500 MG tablet Take 500 mg by mouth 2 (two) times daily with a meal.    [provider]  ?   ? ?Allergies    ?Allopurinol   ? ?Review of Systems   ?Review of Systems ? ?Physical Exam ?Updated Vital Signs ?BP 137/85   Pulse (!) 105   Temp  99.4 ?F (37.4 ?C) (Oral)   Resp (!) 33   Ht '5\' 6"'$  (1.676 m)   Wt 86.2 kg   SpO2 93%   BMI 30.67 kg/m?  ?Physical Exam ?Vitals and nursing note reviewed.  ?Constitutional:   ?   Appearance: He is well-developed. He is ill-appearing.  ?   Comments: Chronically ill-appearing.  ?HENT:  ?   Head: Normocephalic and atraumatic.  ?Eyes:  ?   Pupils: Pupils are equal, round, and reactive to light.  ?Neck:  ?   Vascular: No JVD.  ?Cardiovascular:  ?   Rate and Rhythm: Normal rate and regular rhythm.  ?   Heart sounds: No murmur heard. ?  No friction rub. No gallop.  ?Pulmonary:  ?   Effort: No respiratory distress.  ?   Breath sounds: No wheezing.  ?   Comments: Tachypnea ?Abdominal:  ?   General: There is no distension.  ?   Tenderness: There is no abdominal tenderness. There is no guarding or rebound.  ?Musculoskeletal:     ?   General: Normal range of motion.  ?   Cervical back: Normal range of motion and neck supple.  ?   Comments: Prepatellar swelling bilaterally.  Ecchymosis to the right lower extremity.  Rash is nonblanching.  The tip of the right third digit has some darkening with an ulceration at the plantar aspect.  Pulse motor and sensation intact bilateral extremities.  Some chronic appearing deformity to bilateral feet.  ?Skin: ?   Coloration: Skin is not pale.  ?   Findings: No rash.  ?Neurological:  ?   Mental Status: He is alert and oriented to person, place, and time.  ?Psychiatric:     ?   Behavior: Behavior normal.  ? ? ?ED Results / Procedures / Treatments   ?Labs ?(all labs ordered are listed, but only abnormal results are displayed) ?Labs Reviewed  ?LACTIC ACID, PLASMA - Abnormal; Notable for the following components:  ?    Result Value  ? Lactic Acid, Venous 3.2 (*)   ? All other components within normal limits  ?COMPREHENSIVE METABOLIC PANEL - Abnormal; Notable for the following components:  ? Sodium 118 (*)   ? Chloride 81 (*)   ? CO2 21 (*)   ? Glucose, Bld 190 (*)   ? BUN 63 (*)   ?  Creatinine, Ser 2.20 (*)   ? Calcium 8.1 (*)   ? Albumin 2.3 (*)   ? AST 67 (*)   ? ALT 70 (*)   ? Alkaline Phosphatase 127 (*)   ? GFR, Estimated 31 (*)   ? Anion gap 16 (*)   ? All other components within normal limits  ?CBC WITH DIFFERENTIAL/PLATELET - Abnormal; Notable for the following components:  ? WBC 31.0 (*)   ? Platelets 541 (*)   ? Neutro Abs 29.2 (*)   ? Lymphs Abs 0.5 (*)   ? Abs Immature Granulocytes 0.31 (*)   ? All other components within normal limits  ?PROTIME-INR - Abnormal; Notable for the following components:  ? Prothrombin Time 24.6 (*)   ? INR 2.2 (*)   ? All other components within normal limits  ?APTT - Abnormal; Notable for the following components:  ? aPTT 47 (*)   ? All other components within normal limits  ?CULTURE, BLOOD (ROUTINE X 2)  ?CULTURE, BLOOD (ROUTINE X 2)  ?URINE CULTURE  ?RESP PANEL BY RT-PCR (FLU A&B, COVID) ARPGX2  ?EXPECTORATED SPUTUM ASSESSMENT W GRAM STAIN, RFLX TO RESP C  ?LACTIC ACID, PLASMA  ?URINALYSIS, ROUTINE W REFLEX MICROSCOPIC  ?LEGIONELLA PNEUMOPHILA SEROGP 1 UR AG  ?HEMOGLOBIN A1C  ?STREP PNEUMONIAE URINARY ANTIGEN  ?BASIC METABOLIC PANEL  ?BASIC METABOLIC PANEL  ?BASIC METABOLIC PANEL  ? ? ?EKG ?EKG Interpretation ? ?Date/Time:  Thursday January 04 2022 10:45:58 EDT ?Ventricular Rate:  107 ?PR Interval:  178 ?QRS Duration: 84 ?QT Interval:  365 ?QTC Calculation: 487 ?R Axis:   -38 ?Text Interpretation: Sinus tachycardia Inferior infarct, old No old tracing to compare Confirmed by Deno Etienne [phone]) on 01/04/2022 11:11:38 AM ? ?Radiology ?DG Chest Port 1 View ? ?Result Date: 01/04/2022 ?CLINICAL DATA:  Question sepsis EXAM: PORTABLE CHEST 1 VIEW COMPARISON:  None. FINDINGS: Elevated right hemidiaphragm with right lower lobe atelectasis. Left lung clear. Decreased lung volumes. Negative for heart failure. No pleural effusion. Heart size and vascularity normal. IMPRESSION: Hypoventilation. Elevated right hemidiaphragm with right lower lobe airspace disease  consistent with atelectasis. Electronically Signed   By: Franchot Gallo M.D.   On: 01/04/2022 11:10   ? ?Procedures ?Procedures  ? ? ?Medications Ordered in ED ?Medications  ?cefTRIAXone (ROCEPHIN) 2 g in sodium chloride 0.9 % 100 mL IVPB (2 g Intravenous New  Bag/Given 01/04/22 1229)  ?azithromycin (ZITHROMAX) 500 mg in sodium chloride 0.9 % 250 mL IVPB (has no administration in time range)  ?insulin aspart (novoLOG) injection 0-15 Units (has no administration in time range)  ?cefTRIAXone (ROCEPHIN) 1 g in sodium chloride 0.9 % 100 mL IVPB (has no administration in time range)  ?azithromycin (ZITHROMAX) 500 mg in sodium chloride 0.9 % 250 mL IVPB (has no administration in time range)  ?0.9 %  sodium chloride infusion (has no administration in time range)  ?acetaminophen (TYLENOL) tablet 650 mg (has no administration in time range)  ?  Or  ?acetaminophen (TYLENOL) suppository 650 mg (has no administration in time range)  ?ondansetron (ZOFRAN) tablet 4 mg (has no administration in time range)  ?  Or  ?ondansetron (ZOFRAN) injection 4 mg (has no administration in time range)  ?ipratropium (ATROVENT) nebulizer solution 0.5 mg (has no administration in time range)  ?albuterol (PROVENTIL) (2.5 MG/3ML) 0.083% nebulizer solution 2.5 mg (has no administration in time range)  ?sodium chloride 0.9 % bolus 1,000 mL (1,000 mLs Intravenous New Bag/Given 01/04/22 1144)  ?acetaminophen (TYLENOL) tablet 1,000 mg (1,000 mg Oral Given 01/04/22 1137)  ?oxyCODONE (Oxy IR/ROXICODONE) immediate release tablet 5 mg (5 mg Oral Given 01/04/22 1137)  ? ? ?ED Course/ Medical Decision Making/ A&P ?  ?                        ?Medical Decision Making ?Amount and/or Complexity of Data Reviewed ?Labs: ordered. ?Radiology: ordered. ?ECG/medicine tests: ordered. ? ?Risk ?OTC drugs. ?Prescription drug management. ?Decision regarding hospitalization. ? ? ?71 yo M with a chief complaints of gout.  He tells me that he has gout in his left foot and now it is  moved to both of his knees.  Clinically the patient looks unwell he is tachycardic into the 120s and feels very warm to touch and has some tachypnea.  He does later endorse that he was feeling a little bit short of breath last ni

## 2022-01-05 ENCOUNTER — Inpatient Hospital Stay (HOSPITAL_COMMUNITY): Payer: Medicare Other

## 2022-01-05 DIAGNOSIS — L0390 Cellulitis, unspecified: Secondary | ICD-10-CM | POA: Diagnosis not present

## 2022-01-05 DIAGNOSIS — R609 Edema, unspecified: Secondary | ICD-10-CM

## 2022-01-05 DIAGNOSIS — E1169 Type 2 diabetes mellitus with other specified complication: Secondary | ICD-10-CM | POA: Diagnosis not present

## 2022-01-05 DIAGNOSIS — M86179 Other acute osteomyelitis, unspecified ankle and foot: Secondary | ICD-10-CM | POA: Diagnosis not present

## 2022-01-05 DIAGNOSIS — R7881 Bacteremia: Secondary | ICD-10-CM

## 2022-01-05 DIAGNOSIS — E871 Hypo-osmolality and hyponatremia: Secondary | ICD-10-CM | POA: Diagnosis not present

## 2022-01-05 LAB — BLOOD CULTURE ID PANEL (REFLEXED) - BCID2

## 2022-01-05 LAB — COMPREHENSIVE METABOLIC PANEL
ALT: 79 U/L — ABNORMAL HIGH (ref 0–44)
AST: 105 U/L — ABNORMAL HIGH (ref 15–41)
Albumin: 1.8 g/dL — ABNORMAL LOW (ref 3.5–5.0)
Alkaline Phosphatase: 136 U/L — ABNORMAL HIGH (ref 38–126)
Anion gap: 15 (ref 5–15)
BUN: 57 mg/dL — ABNORMAL HIGH (ref 8–23)
CO2: 20 mmol/L — ABNORMAL LOW (ref 22–32)
Calcium: 7.7 mg/dL — ABNORMAL LOW (ref 8.9–10.3)
Chloride: 87 mmol/L — ABNORMAL LOW (ref 98–111)
Creatinine, Ser: 1.81 mg/dL — ABNORMAL HIGH (ref 0.61–1.24)
GFR, Estimated: 40 mL/min — ABNORMAL LOW (ref >60)
Glucose, Bld: 91 mg/dL (ref 70–99)
Potassium: 4.1 mmol/L (ref 3.5–5.1)
Sodium: 122 mmol/L — ABNORMAL LOW (ref 135–145)
Total Bilirubin: 2 mg/dL — ABNORMAL HIGH (ref 0.3–1.2)
Total Protein: 5.8 g/dL — ABNORMAL LOW (ref 6.5–8.1)

## 2022-01-05 LAB — GLUCOSE, CAPILLARY
Glucose-Capillary: 103 mg/dL — ABNORMAL HIGH (ref 70–99)
Glucose-Capillary: 116 mg/dL — ABNORMAL HIGH (ref 70–99)
Glucose-Capillary: 96 mg/dL (ref 70–99)
Glucose-Capillary: 99 mg/dL (ref 70–99)

## 2022-01-05 LAB — ECHOCARDIOGRAM COMPLETE
AR max vel: 2.71 cm2
AV Area VTI: 2.29 cm2
AV Area mean vel: 2.51 cm2
AV Mean grad: 9 mmHg
AV Peak grad: 16.3 mmHg
Ao pk vel: 2.02 m/s
Area-P 1/2: 3.89 cm2
Height: 66 in
S' Lateral: 2.4 cm
Weight: 3047.64 oz

## 2022-01-05 LAB — CBC
HCT: 34.2 % — ABNORMAL LOW (ref 39.0–52.0)
Hemoglobin: 12.5 g/dL — ABNORMAL LOW (ref 13.0–17.0)
MCH: 31.5 pg (ref 26.0–34.0)
MCHC: 36.5 g/dL — ABNORMAL HIGH (ref 30.0–36.0)
MCV: 86.1 fL (ref 80.0–100.0)
Platelets: 436 10*3/uL — ABNORMAL HIGH (ref 150–400)
RBC: 3.97 MIL/uL — ABNORMAL LOW (ref 4.22–5.81)
RDW: 14.2 % (ref 11.5–15.5)
WBC: 26.7 10*3/uL — ABNORMAL HIGH (ref 4.0–10.5)
nRBC: 0 % (ref 0.0–0.2)

## 2022-01-05 LAB — SODIUM: Sodium: 125 mmol/L — ABNORMAL LOW (ref 135–145)

## 2022-01-05 LAB — NA AND K (SODIUM & POTASSIUM), RAND UR
Potassium Urine: 30 mmol/L
Sodium, Ur: <10 mmol/L

## 2022-01-05 LAB — URIC ACID: Uric Acid, Serum: 8.7 mg/dL — ABNORMAL HIGH (ref 3.7–8.6)

## 2022-01-05 LAB — PHOSPHORUS: Phosphorus: 4 mg/dL (ref 2.5–4.6)

## 2022-01-05 LAB — OSMOLALITY, URINE: Osmolality, Ur: 316 mOsm/kg (ref 300–900)

## 2022-01-05 LAB — HIV ANTIBODY (ROUTINE TESTING W REFLEX): HIV Screen 4th Generation wRfx: NONREACTIVE

## 2022-01-05 IMAGING — MR MR FOOT*R* W/O CM
5 series · 40 of 40 positions shown · non-contrast
Comparison: Radiographs [DATE] and [DATE].

CLINICAL DATA: Foot swelling, diabetic, osteomyelitis suspected.
Right foot swelling and erythema.

EXAM:
MRI OF THE RIGHT FOREFOOT WITHOUT CONTRAST
TECHNIQUE: Multiplanar, multisequence MR imaging of the right forefoot was
performed. No intravenous contrast was administered.

[Series 4: T1 · axial · right · 3.0mm · 0.55mm/px · z∈[-68,+69]mm · 10 of 46 slices shown (1 of 2)]
[im 1/46]
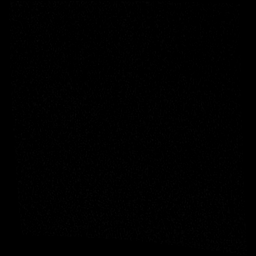
[im 6/46]
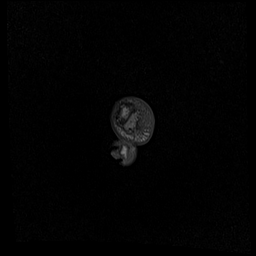
[im 11/46]
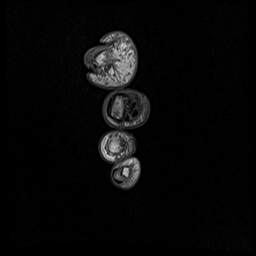
[im 16/46]
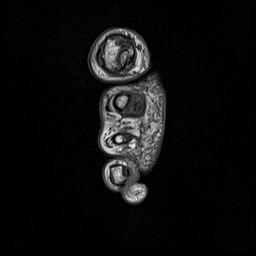
[im 21/46]
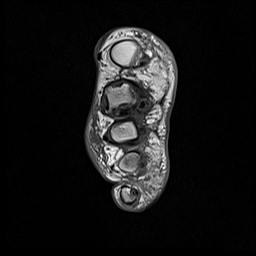
[im 26/46]
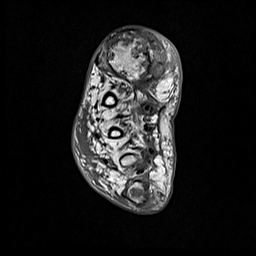
[im 31/46]
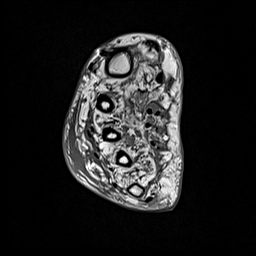
[im 36/46]
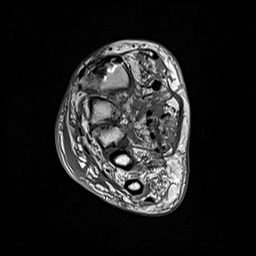
[im 41/46]
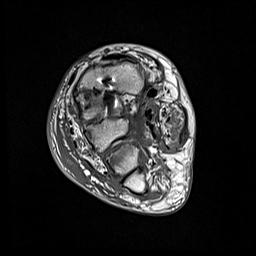
[im 46/46]
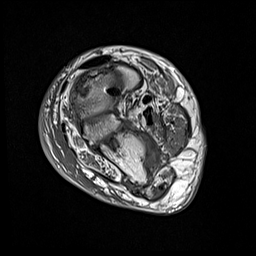

[Series 5: T2 fat-sat · axial · right · 3.0mm · 0.44mm/px · z∈[-68,+69]mm · 11 of 47 slices shown (1 of 2)]
[im 1/47]
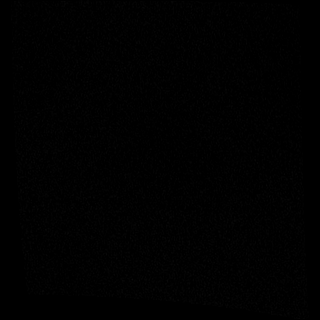
[im 5/47]
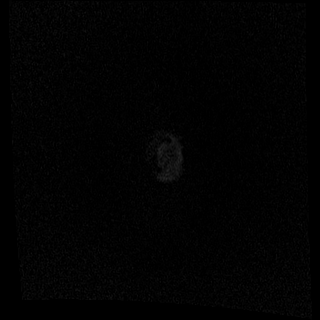
[im 10/47]
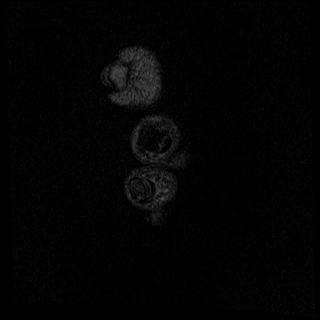
[im 14/47]
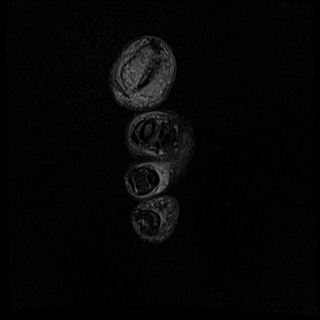
[im 19/47]
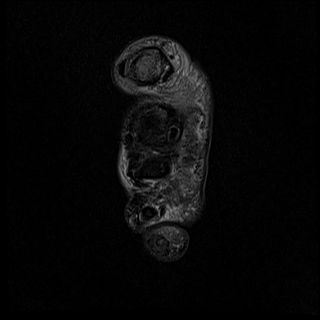
[im 24/47]
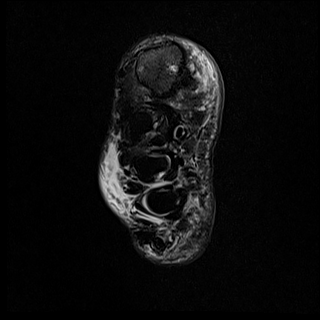
[im 28/47]
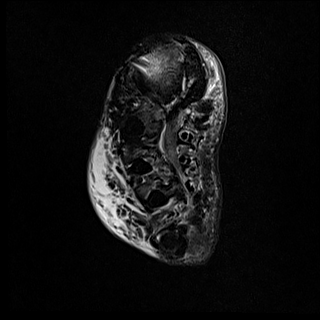
[im 33/47]
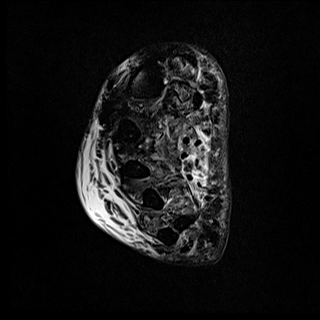
[im 37/47]
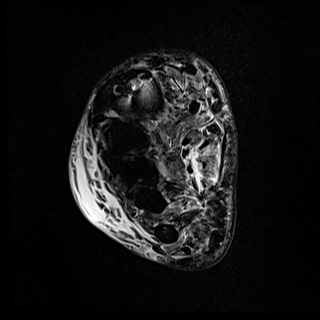
[im 42/47]
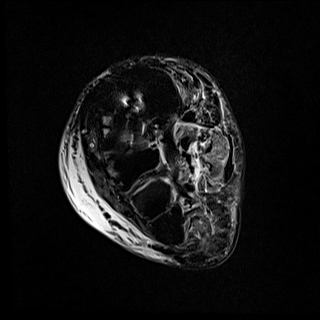
[im 47/47]
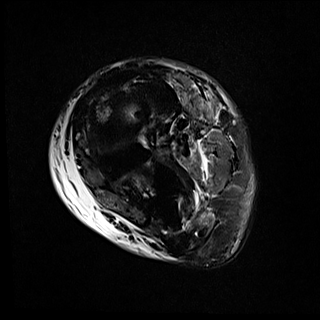

[Series 6: T2 fat-sat · sagittal · right · 3.0mm · 0.70mm/px · 6 of 25 slices shown (2 of 2)]
[im 1/25]
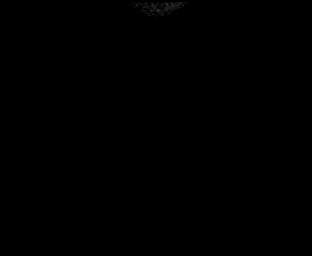
[im 5/25]
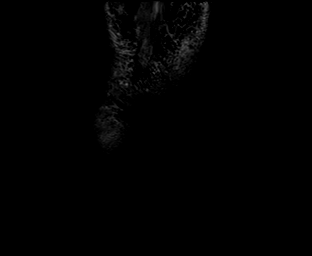
[im 10/25]
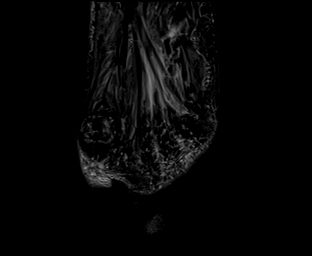
[im 15/25]
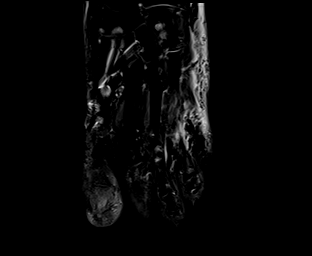
[im 20/25]
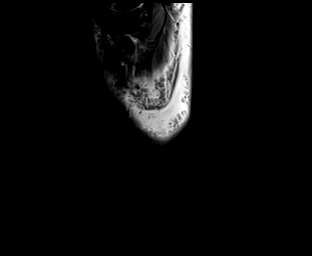
[im 25/25]
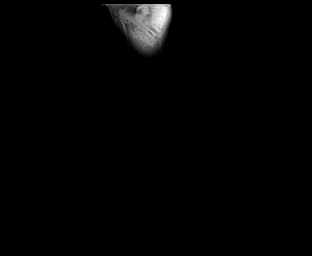

[Series 7: T1 · sagittal · right · 3.0mm · 0.70mm/px · 6 of 27 slices shown (2 of 2)]
[im 1/27]
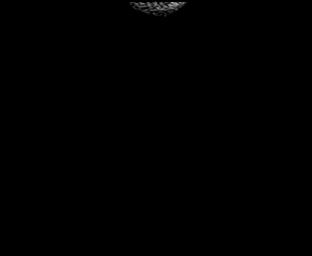
[im 6/27]
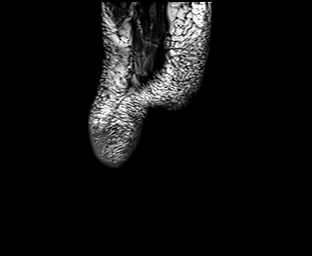
[im 11/27]
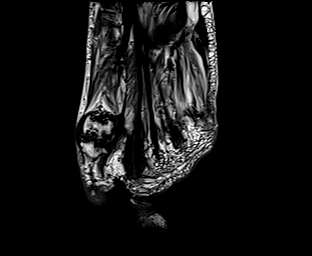
[im 16/27]
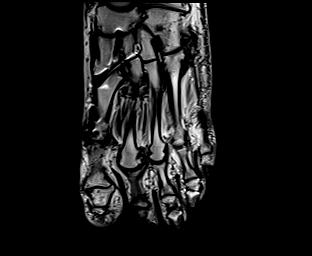
[im 21/27]
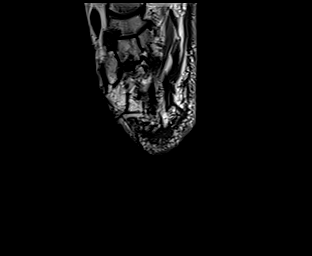
[im 27/27]
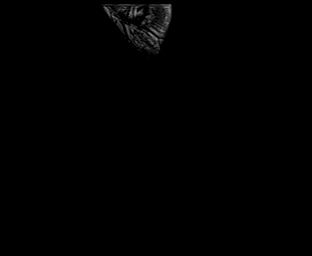

[Series 8: STIR · coronal · right · 3.0mm · 0.35mm/px · 7 of 32 slices shown]
[im 1/32]
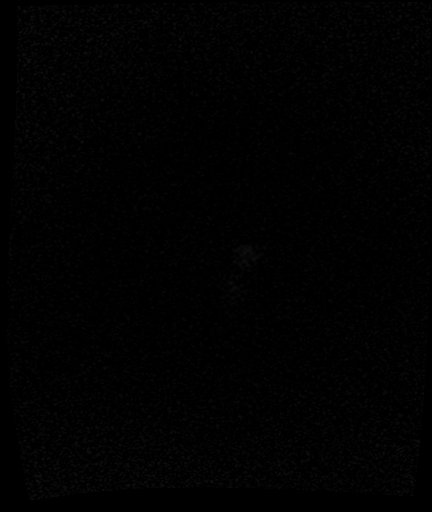
[im 6/32]
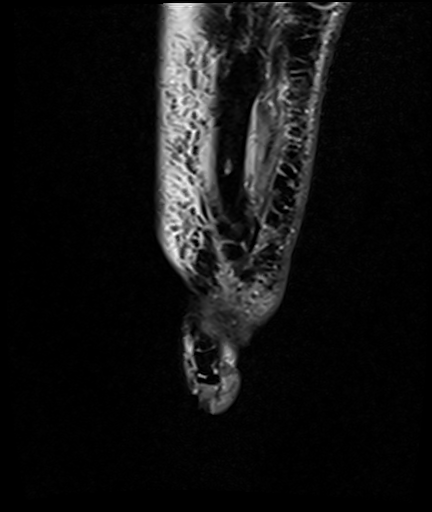
[im 11/32]
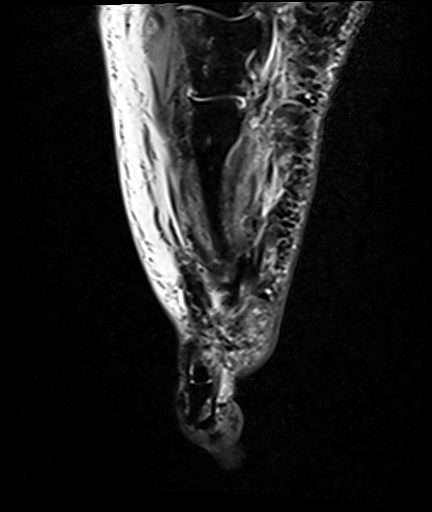
[im 16/32]
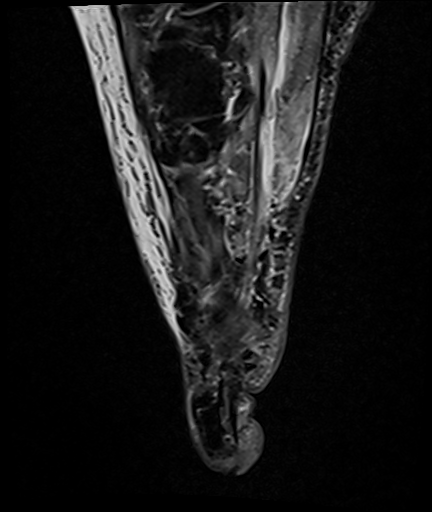
[im 21/32]
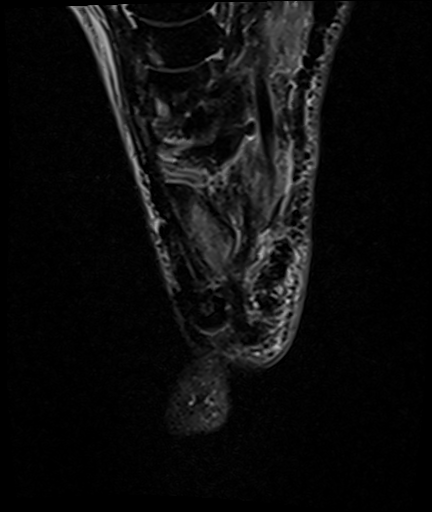
[im 26/32]
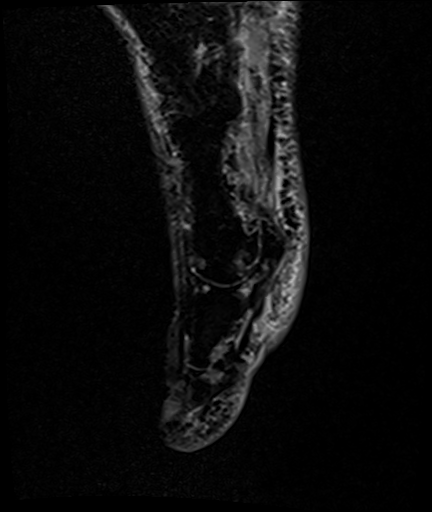
[im 32/32]
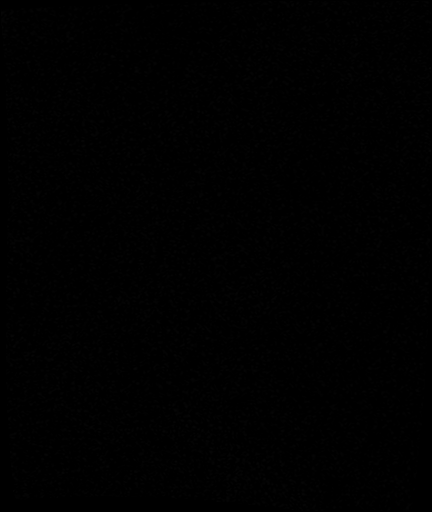

[40 of 40 positions shown; findings below may reference images not displayed]

FINDINGS: Bones/Joint/Cartilage

On the T1 weighted images, the distal cortex of the 2nd distal
phalanx is indistinct, corresponding with the recent radiographic
finding. There is low-level marrow T2 hyperintensity in this area.
No gross bone destruction. There are advanced degenerative changes
at the 1st metatarsophalangeal joint with lesser involvement of the
2nd MTP joint. Patient is status post 1st tarsometatarsal
arthrodesis with solid ankylosis. There are scattered midfoot
degenerative changes. The alignment is normal at the Lisfranc joint.
No significant joint effusions.

Ligaments

No significant ligamentous findings identified.

Muscles and Tendons

Soft tissue thickening around the flexor tendon of the 2nd toe,
suspicious for tenosynovitis. No significant fluid signal is
apparent in this area. More proximally, there is T2 hyperintensity
surrounding the flexor tendons within the midfoot. No evidence of
tendon tear or drainable intramuscular fluid collection.

Soft tissues

Nonspecific dorsal subcutaneous edema. No focal fluid collections
are seen.
IMPRESSION: 1. At the area questioned on the recent radiographs, there is
indistinctness of the distal cortex and mild marrow signal in the
distal 2nd phalanx supportive of early osteomyelitis. No gross bone
destruction.
2. Flexor tenosynovitis with soft tissue thickening around the
flexor tendon in the 2nd toe.
3. No focal soft tissue abscess.
4. Degenerative and postsurgical changes as described.

## 2022-01-05 IMAGING — DX DG KNEE 1-2V*L*
2 series · 2 of 2 positions shown · non-contrast
Comparison: None.

CLINICAL DATA: Hyponatremia

Bilateral knee swelling
History of gout, obesity, OSA, mitral valve insufficiency
Presenting with weakness, decreased mobility
Positive blood cultures
EXAM:
LEFT KNEE - 1-2 VIEW

[knee ap]
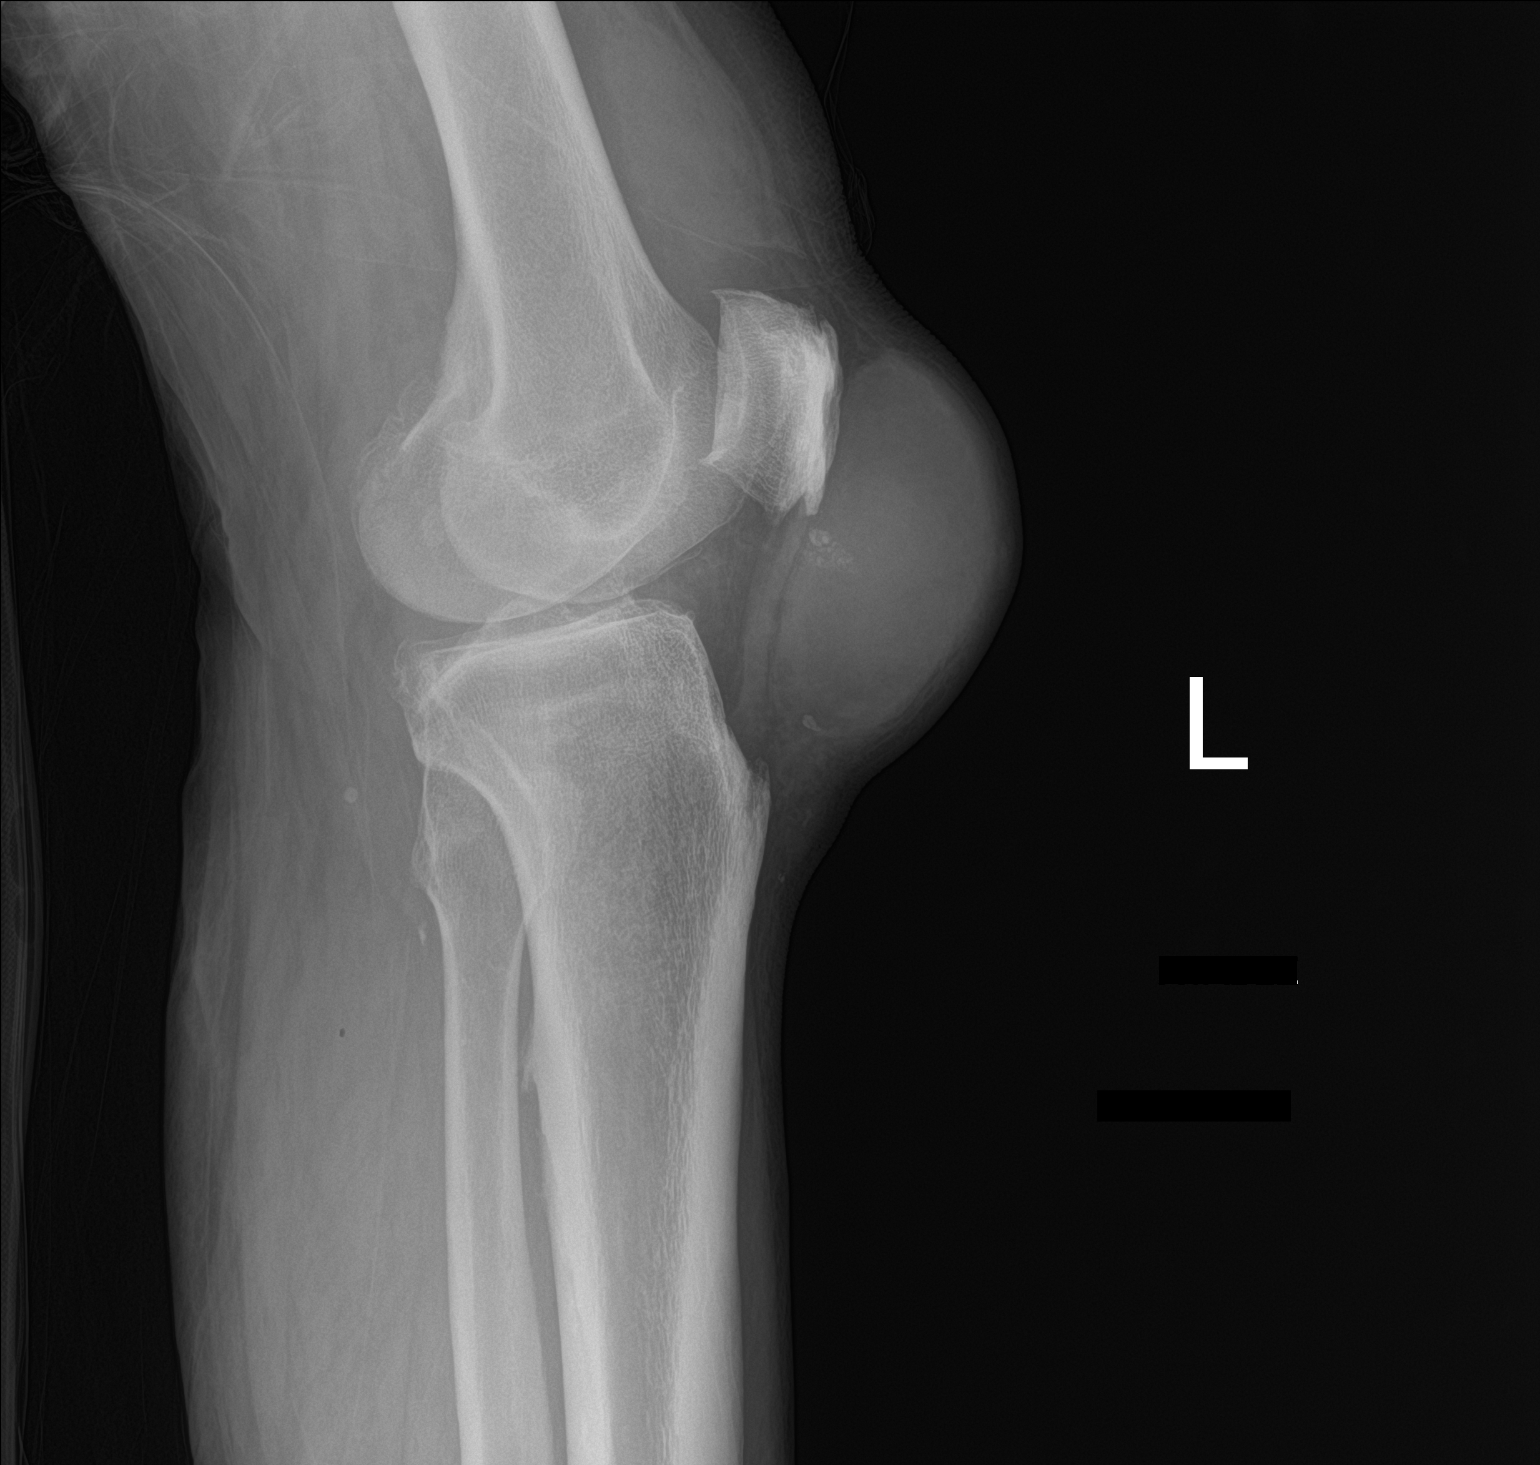

[knee lat]
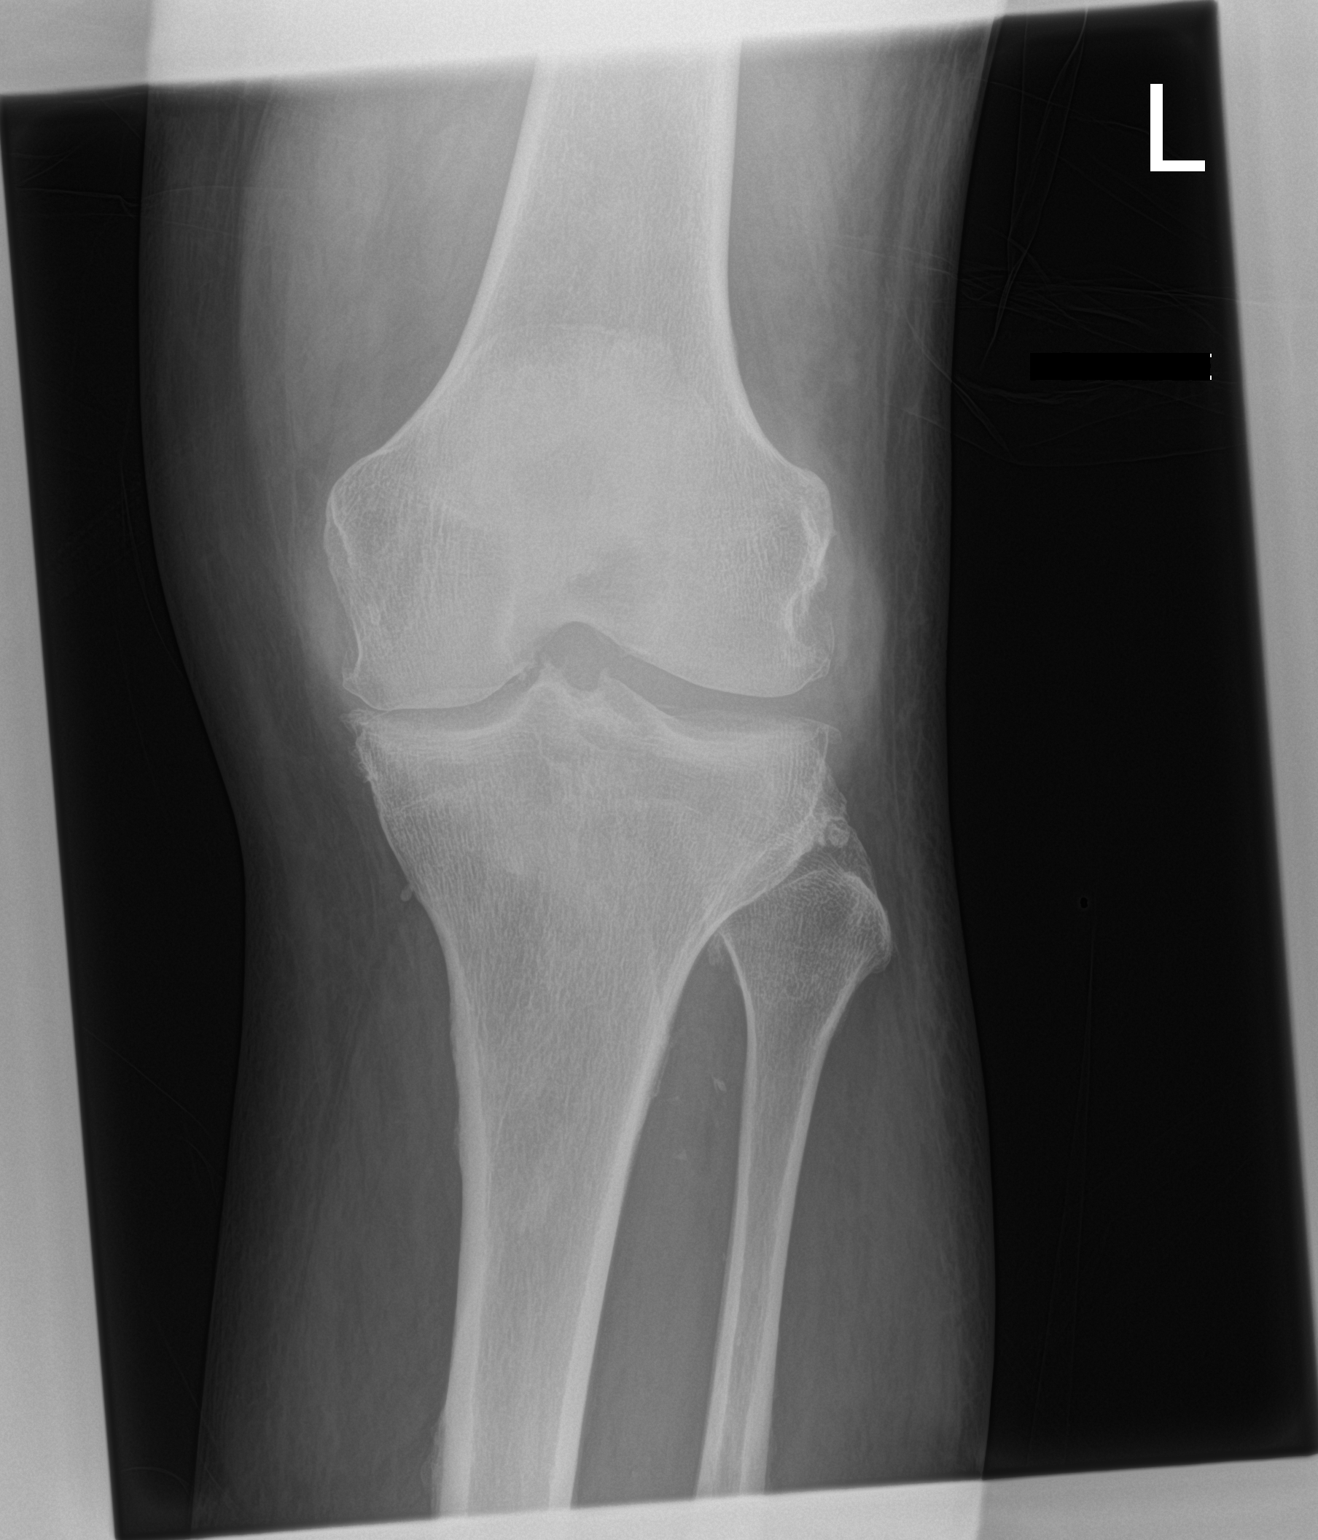

[2 of 2 positions shown; findings below may reference images not displayed]

FINDINGS: Marked prepatellar soft tissue swelling. Mild tricompartmental
spurring. Small knee joint effusion. No fracture or dislocation.
IMPRESSION: 1. Marked prepatellar soft tissue swelling.
2. Small knee joint effusion.

## 2022-01-05 IMAGING — DX DG KNEE 1-2V*R*
2 series · 2 of 2 positions shown · non-contrast
Comparison: None.

CLINICAL DATA: Hyponatremia

Bilateral knee swelling
History of gout, obesity, OSA, mitral valve insufficiency
Presenting with weakness, decreased mobility
Positive blood cultures
EXAM:
RIGHT KNEE - 1-2 VIEW

[knee ap]
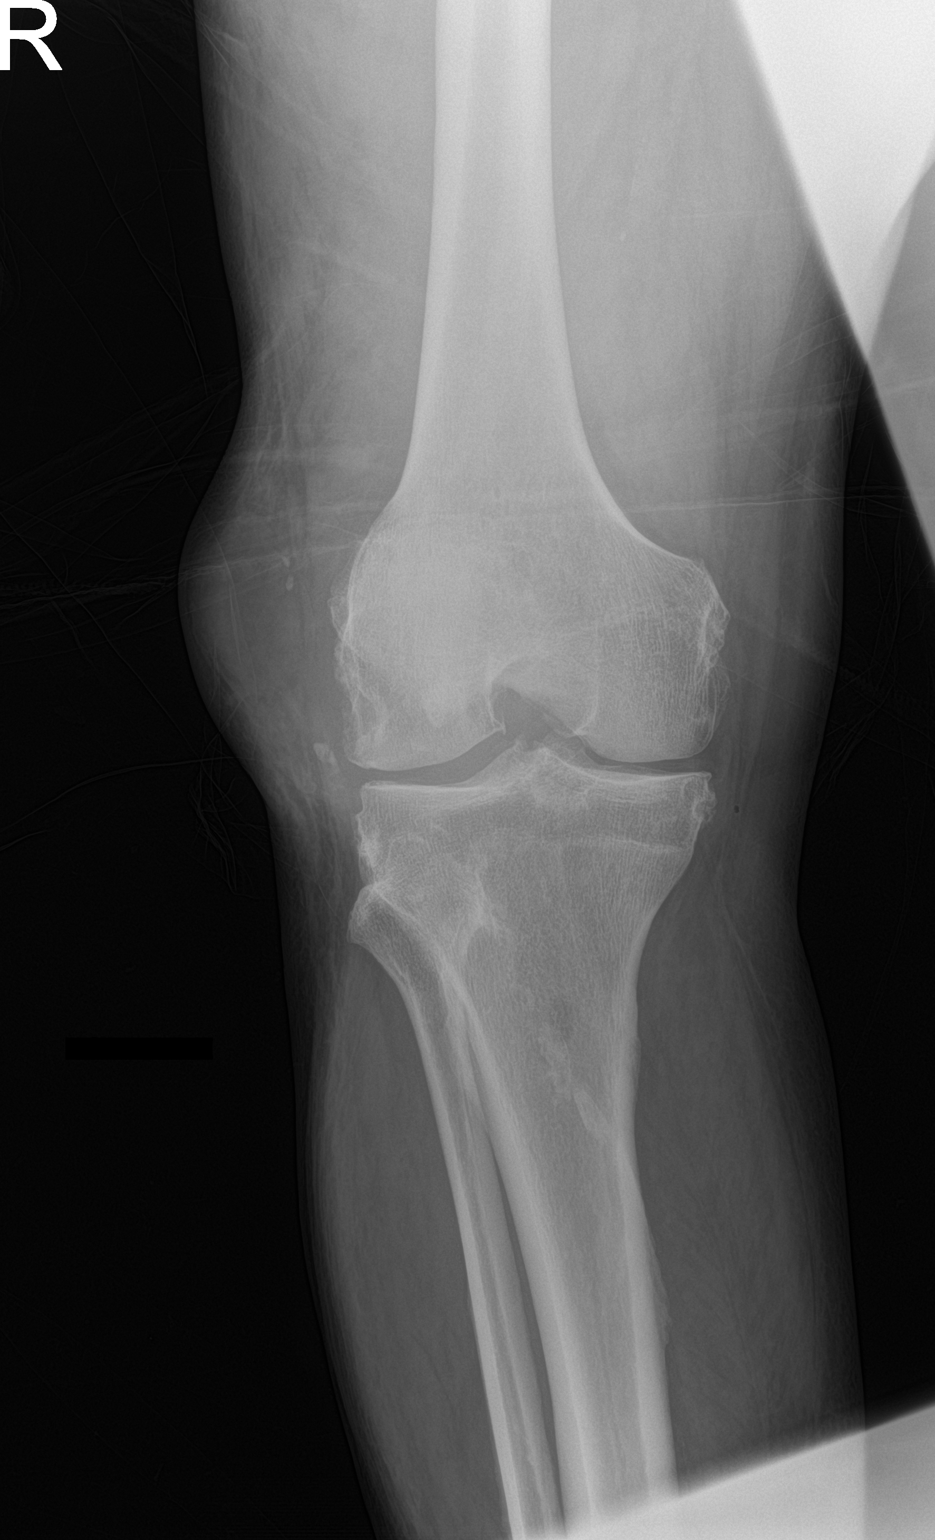

[knee lat]
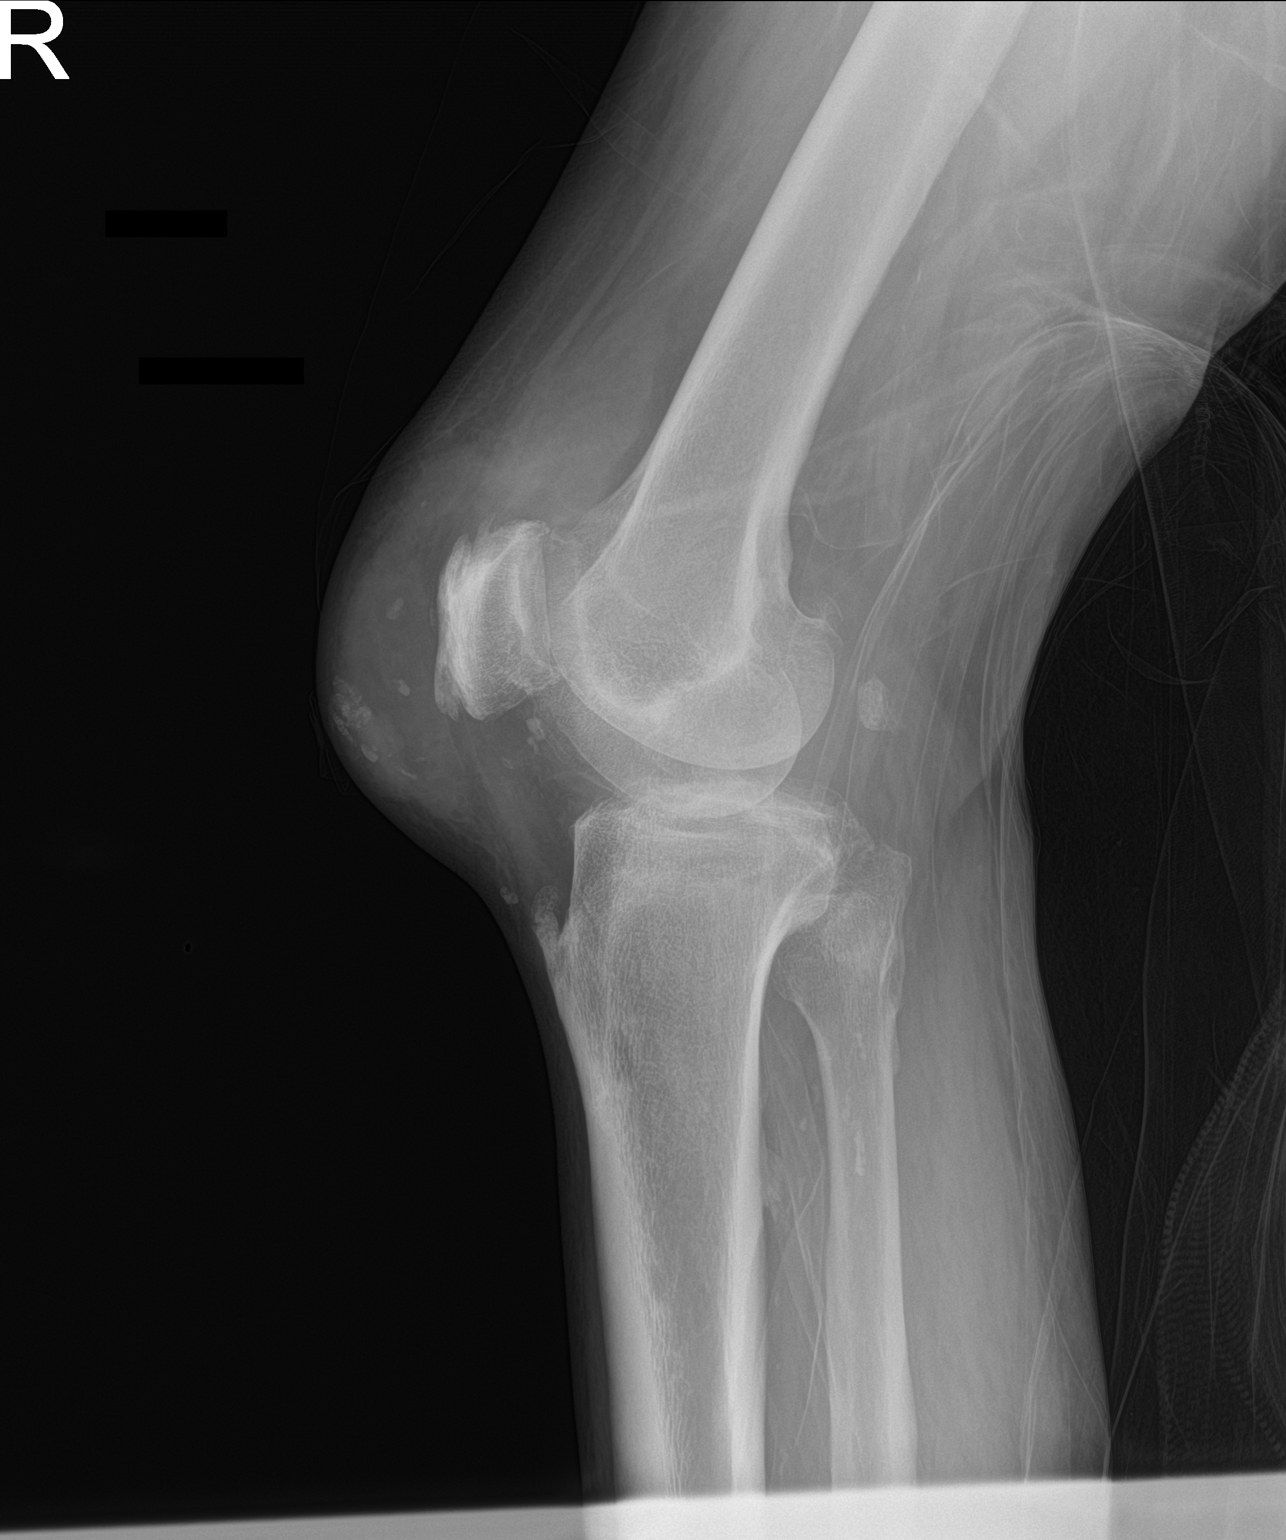

[2 of 2 positions shown; findings below may reference images not displayed]

FINDINGS: Mild spurring of the medial, patellofemoral, and lateral
compartments. No osseous erosions.

Marked prepatellar soft tissue swelling with internal
calcifications. Small knee joint effusion. Well corticated ossific
fragment along the posterior aspect of the knee joint suspicious for
loose body.

Irregularity of the anterior tibial tuberosity the indicative of
prior Osgood-Schlatter disease.
IMPRESSION: 1. Marked prepatellar soft tissue swelling.
2. Small knee joint effusion.

## 2022-01-05 MED ORDER — CEFAZOLIN SODIUM-DEXTROSE 2-4 GM/100ML-% IV SOLN
2.0000 g | Freq: Three times a day (TID) | INTRAVENOUS | Status: DC
  Administered 2022-01-05 – 2022-01-14 (×28): 2 g via INTRAVENOUS
  Filled 2022-01-05 (×33): qty 100

## 2022-01-05 MED ORDER — PERFLUTREN LIPID MICROSPHERE
1.0000 mL | INTRAVENOUS | Status: AC | PRN
  Administered 2022-01-05: 2.5 mL via INTRAVENOUS
  Filled 2022-01-05: qty 10

## 2022-01-05 NOTE — Progress Notes (Incomplete)
?  Echocardiogram ?2D Echocardiogram has been performed. ? ?Vincent White ?01/05/2022, 3:40 PM ?

## 2022-01-05 NOTE — Progress Notes (Signed)
Bilateral lower extremity venous duplex has been completed. ?Preliminary results can be found in CV Proc through chart review.  ? ?01/05/22 9:49 AM ?Carlos Levering RVT   ?

## 2022-01-05 NOTE — Consult Note (Signed)
? ?Reason for Consult: Osteomyelitis right second toe ?Referring Physician: Dr. [place] ? ?Vincent White. is an 71 y.o. male.  ?HPI: He has a history of type 2 diabetes.  He thinks he may have possibly made the skin bleed while trimming his nails.  Not sure how long the wound has been there.  Says it is causing some soreness. ? ?Past Medical History:  ?Diagnosis Date  ? Cervical spondylosis   ? Gout   ? History of kidney stones   ? HLD (hyperlipidemia)   ? Hypertension   ? OSA (obstructive sleep apnea)   ? CPAP  ? Vitamin D deficiency   ? ? ?Past Surgical History:  ?Procedure Laterality Date  ? bone spur surgery  02/05/2017  ? mass removed on left elbow  ? BUNIONECTOMY Right 2014  ? SHOULDER SURGERY Right   ? thumb surgery Right   ? VASECTOMY    ? ? ?Family History  ?Problem Relation Age of Onset  ? Cancer - Other Mother   ?     bone  ? Diabetes Mother   ? Heart attack Father 34  ? Diabetes Father   ? Hypertension Father   ? Cancer - Other Sister   ?     breast  ? Gout Brother   ? Hypertension Maternal Grandmother   ? Hypertension Maternal Grandfather   ? Colon cancer Neg Hx   ? Liver disease Neg Hx   ? Kidney disease Neg Hx   ? ? ?Social History:  reports that he has never smoked. He has never used smokeless tobacco. He reports current alcohol use of about 1.0 standard drink per week. He reports that he does not use drugs. ? ?Allergies:  ?Allergies  ?Allergen Reactions  ? Allopurinol Other (See Comments)  ?  Dehydrated   ? Flexeril [Cyclobenzaprine] Other (See Comments)  ?  confusion  ? Nasal Spray Other (See Comments)  ?  Elevated BP  ? ? ?Medications: I have reviewed the patient's current medications. ? ?Results for orders placed or performed during the hospital encounter of 01/04/22 (from the past 48 hour(s))  ?Blood Culture (routine x 2)     Status: None (Preliminary result)  ? Collection Time: 01/04/22 10:55 AM  ? Specimen: BLOOD  ?Result Value Ref Range  ? Specimen Description    ?  BLOOD LEFT  ANTECUBITAL ?Performed at [facility], [place] [address], [place], [place] [address] ?  ? Special Requests    ?  BOTTLES DRAWN AEROBIC AND ANAEROBIC Blood Culture adequate volume ?Performed at [facility], [place] [address], [place], [place] [address] ?  ? Culture  Setup Time    ?  GRAM POSITIVE COCCI ?IN BOTH AEROBIC AND ANAEROBIC BOTTLES ?CRITICAL VALUE NOTED.  VALUE IS CONSISTENT WITH PREVIOUSLY REPORTED AND CALLED VALUE. ?  ? Culture    ?  NO GROWTH 1 DAY ?Performed at [facility], [place] [address], [place], [place] [address] ?  ? Report Status PENDING   ?Lactic acid, plasma     Status: Abnormal  ? Collection Time: 01/04/22 10:56 AM  ?Result Value Ref Range  ? Lactic Acid, Venous 3.2 (HH) 0.5 - 1.9 mmol/L  ?  Comment: CRITICAL RESULT CALLED TO, READ BACK BY AND VERIFIED WITH: ?Charlestine Massed RN AT 1203 01/04/22 BY TIBBITTS, K ?Performed at [facility], [place] [address] [place], [place] [address] ?  ?Comprehensive metabolic panel     Status: Abnormal  ?  Collection Time: 01/04/22 10:56 AM  ?Result Value Ref Range  ? Sodium 118 (LL) 135 - 145 mmol/L  ?  Comment: CRITICAL RESULT CALLED TO, READ BACK BY AND VERIFIED WITH: ?WHITLEY,J RN AT 1203 01/04/22 BY TIBBITTS,K ?  ? Potassium 4.8 3.5 - 5.1 mmol/L  ? Chloride 81 (L) 98 - 111 mmol/L  ? CO2 21 (L) 22 - 32 mmol/L  ? Glucose, Bld 190 (H) 70 - 99 mg/dL  ?  Comment: Glucose reference range applies only to samples taken after fasting for at least 8 hours.  ? BUN 63 (H) 8 - 23 mg/dL  ? Creatinine, Ser 2.20 (H) 0.61 - 1.24 mg/dL  ? Calcium 8.1 (L) 8.9 - 10.3 mg/dL  ? Total Protein 7.0 6.5 - 8.1 g/dL  ? Albumin 2.3 (L) 3.5 - 5.0 g/dL  ? AST 67 (H) 15 - 41 U/L  ? ALT 70 (H) 0 - 44 U/L  ? Alkaline Phosphatase 127 (H) 38 - 126 U/L  ? Total Bilirubin 1.2 0.3 - 1.2 mg/dL  ? GFR, Estimated 31 (L) >60 mL/min  ?  Comment: (NOTE) ?Calculated using the CKD-EPI Creatinine Equation (2021) ?  ? Anion gap 16 (H) 5 - 15  ?  Comment:  Performed at [facility], [place] [address] [place], [place] [address]  ?CBC WITH DIFFERENTIAL     Status: Abnormal  ? Collection Time: 01/04/22 10:56 AM  ?Result Value Ref Range  ? WBC 31.0 (H) 4.0 - 10.5 K/uL  ? RBC 4.69 4.22 - 5.81 MIL/uL  ? Hemoglobin 14.3 13.0 - 17.0 g/dL  ? HCT 40.2 39.0 - 52.0 %  ? MCV 85.7 80.0 - 100.0 fL  ? MCH 30.5 26.0 - 34.0 pg  ? MCHC 35.6 30.0 - 36.0 g/dL  ? RDW 13.9 11.5 - 15.5 %  ? Platelets 541 (H) 150 - 400 K/uL  ? nRBC 0.0 0.0 - 0.2 %  ? Neutrophils Relative % 93 %  ? Neutro Abs 29.2 (H) 1.7 - 7.7 K/uL  ? Lymphocytes Relative 2 %  ? Lymphs Abs 0.5 (L) 0.7 - 4.0 K/uL  ? Monocytes Relative 3 %  ? Monocytes Absolute 0.8 0.1 - 1.0 K/uL  ? Eosinophils Relative 0 %  ? Eosinophils Absolute 0.0 0.0 - 0.5 K/uL  ? Basophils Relative 1 %  ? Basophils Absolute 0.1 0.0 - 0.1 K/uL  ? Immature Granulocytes 1 %  ? Abs Immature Granulocytes 0.31 (H) 0.00 - 0.07 K/uL  ?  Comment: Performed at [facility], [place] [address], [place], [place] [address]  ?Protime-INR     Status: Abnormal  ? Collection Time: 01/04/22 10:56 AM  ?Result Value Ref Range  ? Prothrombin Time 24.6 (H) 11.4 - 15.2 seconds  ? INR 2.2 (H) 0.8 - 1.2  ?  Comment: (NOTE) ?INR goal varies based on device and disease states. ?Performed at [facility], [place] [address] ?[place], [place] [address] ?  ?APTT     Status: Abnormal  ? Collection Time: 01/04/22 10:56 AM  ?Result Value Ref Range  ? aPTT 47 (H) 24 - 36 seconds  ?  Comment:        ?IF BASELINE aPTT IS ELEVATED, ?SUGGEST PATIENT RISK ASSESSMENT ?BE USED TO DETERMINE APPROPRIATE ?ANTICOAGULANT THERAPY. ?Performed at [facility], [place] [address] [place], [place] [address] ?  ?Blood Culture (routine x 2)     Status: None (Preliminary result)  ? Collection Time: 01/04/22 10:56 AM  ?  Specimen: BLOOD  ?Result Value Ref Range  ? Specimen Description    ?  BLOOD RIGHT ANTECUBITAL ?Performed at [facility], [place] [address], [place], [place] [address] ?  ? Special Requests    ?  BOTTLES DRAWN AEROBIC AND ANAEROBIC Blood Culture results may not be optimal due to an excessive volume of blood received in culture bottles ?Performed at [facility], [place] [address], [place], [place] [address] ?  ? Culture  Setup Time    ?  GRAM POSITIVE COCCI ?AEROBIC BOTTLE ONLY ?IN BOTH AEROBIC AND ANAEROBIC BOTTLES ?CRITICAL RESULT CALLED TO, READ BACK BY AND VERIFIED WITH: ?[place] 01/05/22'@2'$ :14 BY TW ?Performed at [facility], [place] [address], [place], [place] [address] ?  ? Culture GRAM POSITIVE COCCI   ? Report Status PENDING   ?Hemoglobin A1c     Status: Abnormal  ? Collection Time: 01/04/22 10:56 AM  ?Result Value Ref Range  ? Hgb A1c MFr Bld 6.3 (H) 4.8 - 5.6 %  ?  Comment: (NOTE) ?Pre diabetes:          5.7%-6.4% ? ?Diabetes:              >6.4% ? ?Glycemic control for   <7.0% ?adults with diabetes ?  ? Mean Plasma Glucose 134.11 mg/dL  ?  Comment: Performed at [facility], [place] [address], [place], [place] [address]  ?Blood Culture ID Panel (Reflexed)     Status: Abnormal  ? Collection Time: 01/04/22 10:56 AM  ?Result Value Ref Range  ? Enterococcus faecalis NOT DETECTED NOT DETECTED  ? Enterococcus Faecium NOT DETECTED NOT DETECTED  ? Listeria monocytogenes NOT DETECTED NOT DETECTED  ? Staphylococcus species DETECTED (A) NOT DETECTED  ?  Comment: CRITICAL RESULT CALLED TO, READ BACK BY AND VERIFIED WITH: ?PHARMD ELLEN JACKSON 01/05/22'@2'$ :14 BY TW ?  ? Staphylococcus aureus (BCID) DETECTED (A) NOT DETECTED  ?  Comment: CRITICAL RESULT CALLED TO, READ BACK BY AND VERIFIED WITH: ?PHARMD ELLEN JACKSON 01/05/22'@2'$ :14 BY TW ?  ? Staphylococcus epidermidis NOT DETECTED NOT DETECTED  ? Staphylococcus lugdunensis NOT DETECTED NOT DETECTED  ? Streptococcus species NOT DETECTED NOT DETECTED  ? Streptococcus agalactiae NOT DETECTED NOT DETECTED  ? Streptococcus pneumoniae NOT DETECTED  NOT DETECTED  ? Streptococcus pyogenes NOT DETECTED NOT DETECTED  ? A.calcoaceticus-baumannii NOT DETECTED NOT DETECTED  ? Bacteroides fragilis NOT DETECTED NOT DETECTED  ? Enterobacterales NOT DETECTED NOT DETECTED  ? Enter

## 2022-01-05 NOTE — Progress Notes (Signed)
PHARMACY - PHYSICIAN COMMUNICATION ?CRITICAL VALUE ALERT - BLOOD CULTURE IDENTIFICATION (BCID) ? ?Vincent White. is an 71 y.o. male who presented to [facility] on 01/04/2022 with a chief complaint of a gout flare for the past 2 to 3 weeks associated with generalized weakness, decreased mobility, decreased appetite and overall decreased oral intake ? ?Assessment:  GPC 4/4, staph aureus no resisitance ? ?Name of physician (or Provider) Contacted: Jonny Ruiz ? ?Current antibiotics: azith and CTX ? ?Changes to prescribed antibiotics recommended:  ?Start ancef 2gm IV q8h ?D/c CTX ? ?Results for orders placed or performed during the hospital encounter of 01/04/22  ?Blood Culture ID Panel (Reflexed) (Collected: 01/04/2022 10:56 AM)  ?Result Value Ref Range  ? Enterococcus faecalis NOT DETECTED NOT DETECTED  ? Enterococcus Faecium NOT DETECTED NOT DETECTED  ? Listeria monocytogenes NOT DETECTED NOT DETECTED  ? Staphylococcus species DETECTED (A) NOT DETECTED  ? Staphylococcus aureus (BCID) DETECTED (A) NOT DETECTED  ? Staphylococcus epidermidis NOT DETECTED NOT DETECTED  ? Staphylococcus lugdunensis NOT DETECTED NOT DETECTED  ? Streptococcus species NOT DETECTED NOT DETECTED  ? Streptococcus agalactiae NOT DETECTED NOT DETECTED  ? Streptococcus pneumoniae NOT DETECTED NOT DETECTED  ? Streptococcus pyogenes NOT DETECTED NOT DETECTED  ? A.calcoaceticus-baumannii NOT DETECTED NOT DETECTED  ? Bacteroides fragilis NOT DETECTED NOT DETECTED  ? Enterobacterales NOT DETECTED NOT DETECTED  ? Enterobacter cloacae complex NOT DETECTED NOT DETECTED  ? Escherichia coli NOT DETECTED NOT DETECTED  ? Klebsiella aerogenes NOT DETECTED NOT DETECTED  ? Klebsiella oxytoca NOT DETECTED NOT DETECTED  ? Klebsiella pneumoniae NOT DETECTED NOT DETECTED  ? Proteus species NOT DETECTED NOT DETECTED  ? Salmonella species NOT DETECTED NOT DETECTED  ? Serratia marcescens NOT DETECTED NOT DETECTED  ? Haemophilus influenzae NOT DETECTED NOT DETECTED  ?  Neisseria meningitidis NOT DETECTED NOT DETECTED  ? Pseudomonas aeruginosa NOT DETECTED NOT DETECTED  ? Stenotrophomonas maltophilia NOT DETECTED NOT DETECTED  ? Candida albicans NOT DETECTED NOT DETECTED  ? Candida auris NOT DETECTED NOT DETECTED  ? Candida glabrata NOT DETECTED NOT DETECTED  ? Candida krusei NOT DETECTED NOT DETECTED  ? Candida parapsilosis NOT DETECTED NOT DETECTED  ? Candida tropicalis NOT DETECTED NOT DETECTED  ? Cryptococcus neoformans/gattii NOT DETECTED NOT DETECTED  ? Meth resistant mecA/C and MREJ NOT DETECTED NOT DETECTED  ? ? ?Dolly Rias RPh ?01/05/2022, 2:25 AM ? ?

## 2022-01-05 NOTE — Consult Note (Signed)
?  [facility]  ? ? ? ? ? ?Reason for Consult:MSSA bacteremia    ?Referring Physician: CHAMP autoconsult ? ?Principal Problem: ?  Hyponatremia ?Active Problems: ?  Essential hypertension ?  Dyslipidemia ?  Type 2 diabetes mellitus with hyperglycemia (HCC) ?  Gout ?  AKI (acute kidney injury) ([place]) ?  Transaminitis ?  CAP (community acquired pneumonia) ?  OSA (obstructive sleep apnea) ?  Injury of right second toe ? ? ? amLODipine  10 mg Oral Daily  ? Chlorhexidine Gluconate Cloth  6 each Topical Daily  ? colchicine  0.3 mg Oral BID  ? And  ? probenecid  500 mg Oral BID  ? folic acid  1 mg Oral Daily  ? insulin aspart  0-15 Units Subcutaneous TID WC  ? LORazepam  0-4 mg Intravenous Q4H  ? Followed by  ? [START ON 01/06/2022] LORazepam  0-4 mg Intravenous Q8H  ? multivitamin with minerals  1 tablet Oral Daily  ? thiamine  100 mg Oral Daily  ? Or  ? thiamine  100 mg Intravenous Daily  ? ? ?Recommendations: ?Cefazolin ?TTE ?TEE if no findings on TTE ?Repeat blood cultures  ?Orthopedic/podiatry evaluation for osteomyelitis ? ?Assessment: ?He has bacteremia likely from his second toe osteomyelitis and necrosis;  I discussed the need for evaluation of his toe, possible amputation.  ? ?Antibiotics: ?Ceftriaxone + azithromycin > cefazolin ? ?HPI: Vincent White. is a 71 y.o. male with a history of gout, obesity, obstructive sleep apnea, mitral valve insufficiency came in with weakness, decreased mobility, poor po intake for several days.  He thought he was having a flare of his gout and self-treating but persisted and came in for evaluation.  Now found to have MSSA in 4/4 blood cultures on BCID.  He had injured his right second toe and now an xray is c/w osteomyelitis.  No reported fever at home but 101.9 here.  CXR with atelectasis.  Started on ceftriaxone and azithromycin for presumed pneumonia though no new cough, no shortness of breath or significant hypoxia.   ? ? ?Review of Systems: ?  Constitutional: negative for sweats and fatigue ?All other systems reviewed and are negative   ? ?Past Medical History:  ?Diagnosis Date  ? Cervical spondylosis   ? Gout   ? History of kidney stones   ? HLD (hyperlipidemia)   ? Hypertension   ? OSA (obstructive sleep apnea)   ? CPAP  ? Vitamin D deficiency   ? ? ?Social History  ? ?Tobacco Use  ? Smoking status: Never  ? Smokeless tobacco: Never  ?Vaping Use  ? Vaping Use: Never used  ?Substance Use Topics  ? Alcohol use: Yes  ?  Alcohol/week: 1.0 standard drink  ?  Types: 1 Standard drinks or equivalent per week  ? Drug use: Never  ? ? ?Family History  ?Problem Relation Age of Onset  ? Cancer - Other Mother   ?     bone  ? Diabetes Mother   ? Heart attack Father 100  ? Diabetes Father   ? Hypertension Father   ? Cancer - Other Sister   ?     breast  ? Gout Brother   ? Hypertension Maternal Grandmother   ? Hypertension Maternal Grandfather   ? Colon cancer Neg Hx   ? Liver disease Neg Hx   ? Kidney disease Neg Hx   ? ? ?Allergies  ?Allergen Reactions  ? Allopurinol Other (See Comments)  ?  Dehydrated   ? Flexeril [Cyclobenzaprine] Other (See Comments)  ?  confusion  ? Nasal Spray Other (See Comments)  ?  Elevated BP  ? ? ?Physical Exam: ?Constitutional: in no apparent distress  ?Vitals:  ? 01/05/22 0600 01/05/22 0800  ?BP: (!) 137/56 (!) 121/55  ?Pulse: 100 92  ?Resp: (!) 25   ?Temp:  98.1 ?F (36.7 ?C)  ?SpO2: 91%   ? ?EYES: anicteric ?Cardiovascular: Cor RRR ?Respiratory: normal respiratory effort ?Musculoskeletal: no pedal edema noted, left second toe with dry, necrotic area ?Skin: no rash ? ?Lab Results  ?Component Value Date  ? WBC 26.7 (H) 01/05/2022  ? HGB 12.5 (L) 01/05/2022  ? HCT 34.2 (L) 01/05/2022  ? MCV 86.1 01/05/2022  ? PLT 436 (H) 01/05/2022  ?  ?Lab Results  ?Component Value Date  ? CREATININE 1.81 (H) 01/05/2022  ? BUN 57 (H) 01/05/2022  ? NA 122 (L) 01/05/2022  ? K 4.1 01/05/2022  ? CL 87 (L) 01/05/2022  ? CO2 20 (L) 01/05/2022  ?  ?Lab Results   ?Component Value Date  ? ALT 79 (H) 01/05/2022  ? AST 105 (H) 01/05/2022  ? ALKPHOS 136 (H) 01/05/2022  ?  ? ?Microbiology: ?Recent Results (from the past 240 hour(s))  ?Blood Culture (routine x 2)     Status: None (Preliminary result)  ? Collection Time: 01/04/22 10:55 AM  ? Specimen: BLOOD  ?Result Value Ref Range Status  ? Specimen Description   Final  ?  BLOOD LEFT ANTECUBITAL ?Performed at [facility], [place] [address] [place], [place] [address] ?  ? Special Requests   Final  ?  BOTTLES DRAWN AEROBIC AND ANAEROBIC Blood Culture adequate volume ?Performed at [facility], [place] [address] [place], [place] [address] ?  ? Culture  Setup Time   Final  ?  GRAM POSITIVE COCCI ?IN BOTH AEROBIC AND ANAEROBIC BOTTLES ?CRITICAL VALUE NOTED.  VALUE IS CONSISTENT WITH PREVIOUSLY REPORTED AND CALLED VALUE. ?Performed at [facility], [place] [address] [place], [place] [address] ?  ? Culture PENDING  Incomplete  ? Report Status PENDING  Incomplete  ?Blood Culture (routine x 2)     Status: None (Preliminary result)  ? Collection Time: 01/04/22 10:56 AM  ? Specimen: BLOOD  ?Result Value Ref Range Status  ? Specimen Description   Final  ?  BLOOD RIGHT ANTECUBITAL ?Performed at [facility], [place] [address], [place], [place] [address] ?  ? Special Requests   Final  ?  BOTTLES DRAWN AEROBIC AND ANAEROBIC Blood Culture results may not be optimal due to an excessive volume of blood received in culture bottles ?Performed at [facility], [place] [address] [place], [place] [address] ?  ? Culture  Setup Time   Final  ?  GRAM POSITIVE COCCI ?AEROBIC BOTTLE ONLY ?IN BOTH AEROBIC AND ANAEROBIC BOTTLES ?CRITICAL RESULT CALLED TO, READ BACK BY AND VERIFIED WITH: ?PHARMD ELLEN JACKSON 01/05/22'@2'$ :14 BY TW ?Performed at [facility], [place] [address] [place],  [address] ?  ? Culture GRAM POSITIVE COCCI  Final  ? Report Status PENDING  Incomplete  ?Blood  Culture ID Panel (Reflexed)     Status: Abnormal  ? Collection Time: 01/04/22 10:56 AM  ?Result Value Ref Range Status  ? Enterococcus faecalis NOT DETECTED NOT DETECTED Final  ? Enterococcus Faecium NOT DETECTED NOT DETECTED Final  ? Listeria monocytogenes NOT DETECTED NOT DETECTED Final  ? Staphylococcus species DETECTED (A)  NOT DETECTED Final  ?  Comment: CRITICAL RESULT CALLED TO, READ BACK BY AND VERIFIED WITH: ?PHARMD ELLEN JACKSON 01/05/22'@2'$ :14 BY TW ?  ? Staphylococcus aureus (BCID) DETECTED (A) NOT DETECTED Final  ?  Comment: CRITICAL RESULT CALLED TO, READ BACK BY AND VERIFIED WITH: ?PHARMD ELLEN JACKSON 01/05/22'@2'$ :14 BY TW ?  ? Staphylococcus epidermidis NOT DETECTED NOT DETECTED Final  ? Staphylococcus lugdunensis NOT DETECTED NOT DETECTED Final  ? Streptococcus species NOT DETECTED NOT DETECTED Final  ? Streptococcus agalactiae NOT DETECTED NOT DETECTED Final  ? Streptococcus pneumoniae NOT DETECTED NOT DETECTED Final  ? Streptococcus pyogenes NOT DETECTED NOT DETECTED Final  ? A.calcoaceticus-baumannii NOT DETECTED NOT DETECTED Final  ? Bacteroides fragilis NOT DETECTED NOT DETECTED Final  ? Enterobacterales NOT DETECTED NOT DETECTED Final  ? Enterobacter cloacae complex NOT DETECTED NOT DETECTED Final  ? Escherichia coli NOT DETECTED NOT DETECTED Final  ? Klebsiella aerogenes NOT DETECTED NOT DETECTED Final  ? Klebsiella oxytoca NOT DETECTED NOT DETECTED Final  ? Klebsiella pneumoniae NOT DETECTED NOT DETECTED Final  ? Proteus species NOT DETECTED NOT DETECTED Final  ? Salmonella species NOT DETECTED NOT DETECTED Final  ? Serratia marcescens NOT DETECTED NOT DETECTED Final  ? Haemophilus influenzae NOT DETECTED NOT DETECTED Final  ? Neisseria meningitidis NOT DETECTED NOT DETECTED Final  ? Pseudomonas aeruginosa NOT DETECTED NOT DETECTED Final  ? Stenotrophomonas maltophilia NOT DETECTED NOT DETECTED Final  ? Candida albicans NOT DETECTED NOT DETECTED Final  ? Candida auris NOT DETECTED NOT DETECTED Final   ? Candida glabrata NOT DETECTED NOT DETECTED Final  ? Candida krusei NOT DETECTED NOT DETECTED Final  ? Candida parapsilosis NOT DETECTED NOT DETECTED Final  ? Candida tropicalis NOT DETECTED NOT DETECTED Final  ?

## 2022-01-05 NOTE — Progress Notes (Signed)
ABI's have been completed. ?Preliminary results can be found in CV Proc through chart review.  ? ?01/05/22 11:51 AM ?Vincent White RVT   ?

## 2022-01-05 NOTE — Progress Notes (Signed)
?PROGRESS NOTE ? ? ? ?Vincent Cable.  YOK:[id number] DOB: 1951-06-04 DOA: 01/04/2022 ?PCP: Leighton Ruff, MD (Inactive)  ? ? ?Brief Narrative:  ?Vincent Ramnauth. is a 71 year old male with past medical history significant for essential pretension, hyperlipidemia, cervical spondylosis, gout, vitamin D deficiency, mitral valve insufficiency, pulmonary HTN, type 2 diabetes mellitus, OSA on CPAP who presented to [facility] ED on 4/6 via EMS with generalized weakness, fatigue, poor oral intake and inability to ambulate over the last 2-3 weeks.  Patient also complaining of increased knee pain thought to be secondary to an acute gout flare in which he was drinking "tart cherry juice"; as he heard this may help with gout flares.  Patient has not been eating or drinking much over the last week as well other than hard apple cider 6-12 cans daily.  Also reports right second toe wound following cutting his toenails.  He denies fever, no chills, no sore throat, no shortness of breath, no cough, no chest pain, no palpitations, no diaphoresis, no urinary symptoms, no visual changes. ? ?In the ED, temperature 99.4, HR 108, RR 31, BP 124/78, SPO2 94% on room air.  Sodium 118, potassium 4.8, chloride 81, CO2 21, glucose 190, BUN 63, creatinine 2.20, alkaline phosphatase 127, AST 67, ALT 70, total bilirubin 1.2.  Lactic acid 3.2.  WBC 31.0, hemoglobin 14.3, platelets 541.  COVID-19 PCR negative.  Influenza A/B PCR negative.  Urinalysis with large hemoglobin, trace leukocytes, negative nitrite, rare bacteria, greater than 11-20 WBCs.  Chest x-ray with hypoventilation, elevated right hemidiaphragm with right lower lobe airspace disease consistent with atelectasis.  Patient was given acetaminophen 1 g p.o. x1, azithromycin 5 mg IV, ceftriaxone 1 g IV, oxycodone 5 mg p.o. and 1 L NS bolus.  Hospital service consulted for further evaluation and management of sepsis. ? ? ?Assessment & Plan: ? ?Severe sepsis, POA ?MSSA septicemia, POA ?Right second  toe diabetic wound/osteomyelitis ?Patient presenting to the ED with progressive weakness, fatigue, decreased ambulation and poor oral intake.  Patient was febrile up to 101.9, tachycardic, tachypneic with elevated lactic acid of 3.2 on admission.  WBC count elevated 31.0.  Chest x-ray with right lower lobe airspace disease consistent with atelectasis versus pneumonia.  Right second toe x-ray with skin and soft tissue irregularity around the distal digit, indistinct cortex of the distal tuft of the second distal phalanx suspicious for osteomyelitis. ?--Infectious disease and podiatry consulted ?--WBC 31.0>26.7 ?--Blood cultures x2 4/6: BCID + MSSA; awaiting further identification/susceptibilities ?--TTE: Pending ?--Azithromycin 5 mg IV every 24 hours, plan 5-day course; but suspect source likely toe wound/osteo ?--Cefazolin 2 g IV every 8 hours ?--CBC daily, repeat lactic acid in a.m. ?--Supportive care, pain control, IV fluid hydration ? ?Hyponatremia ?Sodium 118 on admission.  Etiology likely secondary to EtOH abuse complicated by severe sepsis as above and likely dehydration from poor oral intake in the days preceding hospitalization.  Urine osmolality 316, urine sodium less than 10. ?--Na 118>>122 ?--Continue IV fluid hydration w/ NA at 15m/hr ?--Na level q12h ?-- Monitor on telemetry ? ?Lower extremity edema ?Etiology likely secondary to acute infectious process.  Previous TTE with LVEF 639-53% normal diastolic parameters on 62/11/3341 ?--Vascular duplex ultrasound bilateral lower extremities to rule out DVT ?--TTE pending as above ?--Lower extremity elevation ? ?Acute renal failure ?Creatinine 2.20 on admission.  Etiology likely secondary to prerenal azotemia in the setting of dehydration and poor oral intake in the days preceding hospitalization versus ATN from underlying severe sepsis as above with septicemia.  Additionally, patient with acute urinary retention likely contributing as well. ?--Cr  2.20>>1.81 ?--Holding home losartan/metformin ?--IV fluid hydration ?--Foley catheter ordered/7 ?--Strict I's and O's ?--Avoid nephrotoxins, renally dose all medications ?--BMP daily ? ?Hx Gout ?Patient with reported history of gout.  Takes Celebrex and Norco as needed.  Not on allopurinol outpatient.  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  Patient has been having trouble ambulating over the last 2 weeks and thought this was too due to acute gout flare that he was self treating with "tart cherry juice".  Bilateral knees with pain on active/passive range of motion, but no erythema/fluctuance and nontender to palpation.  Has been seen by orthopedics in the past, Dr. Gladstone Lighter; last seen in clinic 2019. ?--Check bilateral knee x-rays ?--Check uric acid ?--Colchicine 0.3 mg BID ?--Probenecid mg PO BID ?--Oxycodone 5 mg PO q4h PRN ? ?Transaminitis ?Etiology likely secondary to acute infectious process as above.  Patient on statin outpatient. ?--AST 105>67 ?--ALT 79>70 ?--Tbili 2.0>1.2 ?-- CMP daily ? ?Acute urinary retention ?Patient continues with urinary retention, required 3 in and out catheterizations.  Repeat bladder scan and this morning with greater than 500 mL present.  Likely contributing to renal failure. ?--Foley catheter to be placed 4/7 ? ?EtOH abuse ?Patient continues to endorse significant alcohol use at home with hard apple cider. ?--CIWA protocol with symptom triggered Ativan ?--Thiamine, folic acid, multivitamin ?--Discussed EtOH cessation, TOC for subs abuse resources ? ?Type 2 diabetes mellitus ?Home regimen includes metformin 500 mg p.o. twice daily.  Hemoglobin A1c 6.3, well controlled. ?--Holding oral hypoglycemics while inpatient ?--SSI for coverage ?--CBGs qAC/HS ? ?Essential hypertension ?On losartan 100 mg p.o. daily and amlodipine 10 mg p.o. daily at home ?--Continue amlodipine 10 mg p.o.  daily ?--Holding losartan for AKI as above ?--Continue to monitor blood pressure closely ? ?Hyperlipidemia ?--Holding home statin secondary to elevated LFTs ? ?OSA ?--Continue nocturnal CPAP ? ? ? ?DVT prophylaxis: SCDs Start: 01/04/22 1254 ? ?  Code Status: Full Code ?Family Communication: Updated spouse present at bedside this morning ? ?Disposition Plan:  ?Level of care: Stepdown ?Status is: Inpatient ?Remains inpatient appropriate because: IV antibiotics, IV fluid hydration, pending podiatry/ID consultation, pending TTE, likely will need prolonged antibiotics given MSSA septicemia ?  ? ?Consultants:  ?Podiatry ?Infectious disease ? ?Procedures:  ?TTE: Pending ?Vascular duplex ultrasound bilateral lower extremities: Pending ? ?Antimicrobials:  ?Azithromycin 4/6>> ?Cefazolin 4/6>> ?Ceftriaxone 4/6 - 4/6 ? ? ?Subjective: ?Patient seen examined bedside, resting comfortably.  Complaining of continued weakness, pain to lower extremities, spasms.  Spouse present at bedside.  Discussed findings of bacteria in his bloodstream that is likely related to his toe wound.  Spouse reports "nodules" to his knees have been there for many years.  Has been unable to ambulate over the last 2 weeks, was using crutches.  Continues to drink alcohol outpatient, and "does not like to go to the doctor".  No other specific complaints or concerns at this time.  Denies headache, no chest pain, no palpitations, no shortness of breath, no cough, no congestion, no abdominal pain, no current nausea/vomiting/diarrhea, no paresthesias.  No acute events overnight per nurse staff. ? ?Objective: ?Vitals:  ? 01/05/22 0400 01/05/22 0500 01/05/22 0600 01/05/22 0800  ?BP: (!) 110/52 (!) 122/56 (!) 137/56 (!) 121/55  ?Pulse: 95 97 100 92  ?Resp: 18 (!) 23 (!) 25   ?Temp: 99.8 ?F (37.7 ?C)   98.1 ?  F (36.7 ?C)  ?TempSrc: Oral   Oral  ?SpO2: 92% (!) 89% 91%   ?Weight:      ?Height:      ? ? ?Intake/Output Summary (Last 24 hours) at 01/05/2022 0941 ?Last data  filed at 01/05/2022 0600 ?Gross per 24 hour  ?Intake 1575 ml  ?Output 2007 ml  ?Net -432 ml  ? ?Filed Weights  ? 01/04/22 1050 01/04/22 1517  ?Weight: 86.2 kg 86.4 kg  ? ? ?Examination: ? ?Physical Exam: ?GEN: NAD, alert and orien

## 2022-01-06 ENCOUNTER — Inpatient Hospital Stay (HOSPITAL_COMMUNITY): Payer: Medicare Other | Admitting: Anesthesiology

## 2022-01-06 ENCOUNTER — Inpatient Hospital Stay (HOSPITAL_COMMUNITY): Payer: Medicare Other

## 2022-01-06 ENCOUNTER — Encounter (HOSPITAL_COMMUNITY)
Admission: EM | Disposition: A | Discharge status: Hospice | Payer: Self-pay | Source: Home / Self Care | Attending: Family Medicine

## 2022-01-06 DIAGNOSIS — M869 Osteomyelitis, unspecified: Secondary | ICD-10-CM

## 2022-01-06 DIAGNOSIS — L97519 Non-pressure chronic ulcer of other part of right foot with unspecified severity: Secondary | ICD-10-CM

## 2022-01-06 DIAGNOSIS — E1169 Type 2 diabetes mellitus with other specified complication: Secondary | ICD-10-CM

## 2022-01-06 DIAGNOSIS — E11621 Type 2 diabetes mellitus with foot ulcer: Secondary | ICD-10-CM

## 2022-01-06 DIAGNOSIS — Z7984 Long term (current) use of oral hypoglycemic drugs: Secondary | ICD-10-CM

## 2022-01-06 HISTORY — PX: AMPUTATION TOE: SHX6595

## 2022-01-06 LAB — HEPATITIS PANEL, ACUTE
HCV Ab: NONREACTIVE
Hep A IgM: NONREACTIVE
Hep B C IgM: NONREACTIVE
Hepatitis B Surface Ag: NONREACTIVE

## 2022-01-06 LAB — COMPREHENSIVE METABOLIC PANEL
ALT: 76 U/L — ABNORMAL HIGH (ref 0–44)
AST: 139 U/L — ABNORMAL HIGH (ref 15–41)
Albumin: 1.6 g/dL — ABNORMAL LOW (ref 3.5–5.0)
Alkaline Phosphatase: 171 U/L — ABNORMAL HIGH (ref 38–126)
Anion gap: 12 (ref 5–15)
BUN: 48 mg/dL — ABNORMAL HIGH (ref 8–23)
CO2: 20 mmol/L — ABNORMAL LOW (ref 22–32)
Calcium: 7.6 mg/dL — ABNORMAL LOW (ref 8.9–10.3)
Chloride: 93 mmol/L — ABNORMAL LOW (ref 98–111)
Creatinine, Ser: 1.56 mg/dL — ABNORMAL HIGH (ref 0.61–1.24)
GFR, Estimated: 47 mL/min — ABNORMAL LOW (ref >60)
Glucose, Bld: 82 mg/dL (ref 70–99)
Potassium: 4 mmol/L (ref 3.5–5.1)
Sodium: 125 mmol/L — ABNORMAL LOW (ref 135–145)
Total Bilirubin: 2.5 mg/dL — ABNORMAL HIGH (ref 0.3–1.2)
Total Protein: 5.5 g/dL — ABNORMAL LOW (ref 6.5–8.1)

## 2022-01-06 LAB — CBC
HCT: 32.8 % — ABNORMAL LOW (ref 39.0–52.0)
Hemoglobin: 11.2 g/dL — ABNORMAL LOW (ref 13.0–17.0)
MCH: 30.3 pg (ref 26.0–34.0)
MCHC: 34.1 g/dL (ref 30.0–36.0)
MCV: 88.6 fL (ref 80.0–100.0)
Platelets: 324 10*3/uL (ref 150–400)
RBC: 3.7 MIL/uL — ABNORMAL LOW (ref 4.22–5.81)
RDW: 14.6 % (ref 11.5–15.5)
WBC: 24.7 10*3/uL — ABNORMAL HIGH (ref 4.0–10.5)
nRBC: 0 % (ref 0.0–0.2)

## 2022-01-06 LAB — MAGNESIUM: Magnesium: 2.2 mg/dL (ref 1.7–2.4)

## 2022-01-06 LAB — GLUCOSE, CAPILLARY
Glucose-Capillary: 84 mg/dL (ref 70–99)
Glucose-Capillary: 109 mg/dL — ABNORMAL HIGH (ref 70–99)
Glucose-Capillary: 133 mg/dL — ABNORMAL HIGH (ref 70–99)
Glucose-Capillary: 157 mg/dL — ABNORMAL HIGH (ref 70–99)

## 2022-01-06 LAB — PHOSPHORUS: Phosphorus: 4 mg/dL (ref 2.5–4.6)

## 2022-01-06 LAB — LACTIC ACID, PLASMA: Lactic Acid, Venous: 1.3 mmol/L (ref 0.5–1.9)

## 2022-01-06 LAB — SODIUM: Sodium: 129 mmol/L — ABNORMAL LOW (ref 135–145)

## 2022-01-06 SURGERY — AMPUTATION, TOE
Anesthesia: Monitor Anesthesia Care | Site: Toe | Laterality: Right

## 2022-01-06 MED ORDER — ACETAMINOPHEN 10 MG/ML IV SOLN
INTRAVENOUS | Status: AC
  Filled 2022-01-06: qty 100

## 2022-01-06 MED ORDER — PROPOFOL 10 MG/ML IV BOLUS
INTRAVENOUS | Status: AC
  Filled 2022-01-06: qty 20

## 2022-01-06 MED ORDER — ROPIVACAINE HCL 5 MG/ML IJ SOLN
INTRAMUSCULAR | Status: DC | PRN
  Administered 2022-01-06: 40 mL via PERINEURAL

## 2022-01-06 MED ORDER — 0.9 % SODIUM CHLORIDE (POUR BTL) OPTIME
TOPICAL | Status: DC | PRN
  Administered 2022-01-06: 1000 mL

## 2022-01-06 MED ORDER — FENTANYL CITRATE (PF) 100 MCG/2ML IJ SOLN
INTRAMUSCULAR | Status: AC
  Filled 2022-01-06: qty 2

## 2022-01-06 MED ORDER — DEXAMETHASONE SODIUM PHOSPHATE 10 MG/ML IJ SOLN
INTRAMUSCULAR | Status: DC | PRN
  Administered 2022-01-06: 10 mg

## 2022-01-06 MED ORDER — HYDROMORPHONE HCL 1 MG/ML IJ SOLN: 0.2500 mg | INTRAMUSCULAR | Status: DC | PRN

## 2022-01-06 MED ORDER — OXYCODONE HCL 5 MG/5ML PO SOLN: 5.0000 mg | Freq: Once | ORAL | Status: DC | PRN

## 2022-01-06 MED ORDER — OXYCODONE HCL 5 MG PO TABS: 5.0000 mg | ORAL_TABLET | Freq: Once | ORAL | Status: DC | PRN

## 2022-01-06 MED ORDER — ALBUMIN HUMAN 25 % IV SOLN
25.0000 g | Freq: Three times a day (TID) | INTRAVENOUS | Status: DC
  Administered 2022-01-06 – 2022-01-07 (×3): 25 g via INTRAVENOUS
  Filled 2022-01-06 (×3): qty 100

## 2022-01-06 MED ORDER — MIDAZOLAM HCL 2 MG/2ML IJ SOLN
INTRAMUSCULAR | Status: AC
  Filled 2022-01-06: qty 2

## 2022-01-06 MED ORDER — MIDAZOLAM HCL 2 MG/2ML IJ SOLN
INTRAMUSCULAR | Status: DC | PRN
  Administered 2022-01-06: 2 mg via INTRAVENOUS

## 2022-01-06 MED ORDER — PROPOFOL 500 MG/50ML IV EMUL
INTRAVENOUS | Status: DC | PRN
  Administered 2022-01-06: 70 ug/kg/min via INTRAVENOUS

## 2022-01-06 MED ORDER — FENTANYL CITRATE (PF) 100 MCG/2ML IJ SOLN
INTRAMUSCULAR | Status: DC | PRN
  Administered 2022-01-06 (×2): 50 ug via INTRAVENOUS

## 2022-01-06 MED ORDER — VANCOMYCIN HCL 1000 MG IV SOLR
INTRAVENOUS | Status: AC
  Filled 2022-01-06: qty 20

## 2022-01-06 MED ORDER — AMISULPRIDE (ANTIEMETIC) 5 MG/2ML IV SOLN: 10.0000 mg | Freq: Once | INTRAVENOUS | Status: DC | PRN

## 2022-01-06 MED ORDER — BUPIVACAINE HCL (PF) 0.5 % IJ SOLN
INTRAMUSCULAR | Status: AC
  Filled 2022-01-06: qty 30

## 2022-01-06 MED ORDER — ONDANSETRON HCL 4 MG/2ML IJ SOLN: 4.0000 mg | Freq: Once | INTRAMUSCULAR | Status: DC | PRN

## 2022-01-06 MED ORDER — ACETAMINOPHEN 10 MG/ML IV SOLN
INTRAVENOUS | Status: DC | PRN
  Administered 2022-01-06: 1000 mg via INTRAVENOUS

## 2022-01-06 MED ORDER — ONDANSETRON HCL 4 MG/2ML IJ SOLN
INTRAMUSCULAR | Status: AC
  Filled 2022-01-06: qty 2

## 2022-01-06 MED ORDER — MIDODRINE HCL 5 MG PO TABS
10.0000 mg | ORAL_TABLET | Freq: Three times a day (TID) | ORAL | Status: DC
  Administered 2022-01-06 – 2022-01-07 (×4): 10 mg via ORAL
  Filled 2022-01-06 (×4): qty 2

## 2022-01-06 SURGICAL SUPPLY — 43 items
BAG COUNTER SPONGE SURGICOUNT (BAG) IMPLANT
BLADE SURG 15 STRL LF DISP TIS (BLADE) IMPLANT
BLADE SURG 15 STRL SS (BLADE)
BNDG CONFORM 2 STRL LF (GAUZE/BANDAGES/DRESSINGS) ×1 IMPLANT
BNDG CONFORM 4 STRL LF (GAUZE/BANDAGES/DRESSINGS) ×2 IMPLANT
BNDG ELASTIC 4X5.8 VLCR STR LF (GAUZE/BANDAGES/DRESSINGS) ×1 IMPLANT
BNDG ELASTIC 6X5.8 VLCR STR LF (GAUZE/BANDAGES/DRESSINGS) ×2 IMPLANT
CLEANER TIP ELECTROSURG 2X2 (MISCELLANEOUS) IMPLANT
CNTNR URN SCR LID CUP LEK RST (MISCELLANEOUS) IMPLANT
CONT SPEC 4OZ STRL OR WHT (MISCELLANEOUS)
COVER SURGICAL LIGHT HANDLE (MISCELLANEOUS) ×2 IMPLANT
CUFF TOURN SGL QUICK 24 (TOURNIQUET CUFF)
CUFF TRNQT CYL 24X4X16.5-23 (TOURNIQUET CUFF) IMPLANT
GAUZE SPONGE 2X2 8PLY STRL LF (GAUZE/BANDAGES/DRESSINGS) IMPLANT
GAUZE SPONGE 4X4 12PLY STRL (GAUZE/BANDAGES/DRESSINGS) ×2 IMPLANT
GAUZE XEROFORM 1X8 LF (GAUZE/BANDAGES/DRESSINGS) ×2 IMPLANT
GLOVE BIO SURGEON STRL SZ7.5 (GLOVE) ×2 IMPLANT
GLOVE BIOGEL PI IND STRL 7.5 (GLOVE) ×1 IMPLANT
GLOVE BIOGEL PI IND STRL 8 (GLOVE) ×1 IMPLANT
GLOVE BIOGEL PI INDICATOR 7.5 (GLOVE) ×1
GLOVE BIOGEL PI INDICATOR 8 (GLOVE) ×1
GLOVE SKINSENSE NS SZ8.0 LF (GLOVE) ×1
GLOVE SKINSENSE STRL SZ8.0 LF (GLOVE) ×1 IMPLANT
GLOVE SURG ENC TEXT LTX SZ7 (GLOVE) ×2 IMPLANT
GOWN STRL REUS W/ TWL XL LVL3 (GOWN DISPOSABLE) ×1 IMPLANT
GOWN STRL REUS W/TWL XL LVL3 (GOWN DISPOSABLE) ×2
KIT BASIN OR (CUSTOM PROCEDURE TRAY) ×2 IMPLANT
NDL HYPO 25X1 1.5 SAFETY (NEEDLE) ×1 IMPLANT
NEEDLE HYPO 25X1 1.5 SAFETY (NEEDLE) ×2 IMPLANT
PACK ORTHO EXTREMITY (CUSTOM PROCEDURE TRAY) ×2 IMPLANT
PADDING UNDERCAST 2 STRL (CAST SUPPLIES) ×2
PADDING UNDERCAST 2X4 STRL (CAST SUPPLIES) ×1 IMPLANT
PENCIL SMOKE EVACUATOR (MISCELLANEOUS) ×2 IMPLANT
SPIKE FLUID TRANSFER (MISCELLANEOUS) IMPLANT
SPONGE GAUZE 2X2 8PLY STRL LF (GAUZE/BANDAGES/DRESSINGS) ×1 IMPLANT
SPONGE GAUZE 2X2 STER 10/PKG (GAUZE/BANDAGES/DRESSINGS) ×1
STAPLER VISISTAT 35W (STAPLE) ×2 IMPLANT
SUT ETHILON 4 0 PS 2 18 (SUTURE) ×3 IMPLANT
SUT VIC AB 4-0 PS2 27 (SUTURE) ×2 IMPLANT
SYR 20ML LL LF (SYRINGE) IMPLANT
TOWEL OR 17X26 10 PK STRL BLUE (TOWEL DISPOSABLE) ×2 IMPLANT
TOWEL OR NON WOVEN STRL DISP B (DISPOSABLE) ×2 IMPLANT
UNDERPAD 30X36 HEAVY ABSORB (UNDERPADS AND DIAPERS) ×4 IMPLANT

## 2022-01-06 NOTE — Op Note (Signed)
Patient Name: Vincent White. ?DOB: 05-18-1951  ?MRN: [id number] ?  ?Date of Service: 01/04/2022 - 01/06/2022 ? ?Surgeon: Dr. Lanae Crumbly, DPM ?Assistants: None ?Pre-operative Diagnosis:  ?Diabetic neuropathic toe ulcer ?Osteomyelitis second toe right ?Post-operative Diagnosis:  ?Diabetic neuropathic toe ulcer ?Osteomyelitis second toe right ?Procedures: ? 1) partial amputation second toe right ?Pathology/Specimens: ?ID Type Source Tests Collected by Time Destination  ?1 : right second toe Amputation Toe, Right SURGICAL PATHOLOGY Criselda Peaches, DPM 01/06/2022 0803   ?A : tissue from right secon toe for cultures Tissue PATH Soft tissue AEROBIC/ANAEROBIC CULTURE W GRAM STAIN (SURGICAL/DEEP WOUND) Criselda Peaches, DPM 01/06/2022 0805   ? ?Anesthesia: Regional ?Hemostasis:  ?Total Tourniquet Time Documented: ?Thigh (Right) - 17 minutes ?Total: Thigh (Right) - 17 minutes ? ?Estimated Blood Loss: Minimal ?Materials: * No implants in log * ?Medications: None ?Complications: None ? ?Indications for Procedure:  ?This is a 71 y.o. male with a history of ulceration of the right great toe, MRI revealed osteomyelitis of the distal phalanx of the second toe.  Partial amputation and wound exploration was recommended.  All questions were addressed prior to surgery.  All risk benefits and potential complications were discussed.  No guarantees as to the outcome of surgery were made. ?  ?Procedure in Detail: ?Patient was identified in pre-operative holding area. Formal consent was signed and the right lower extremity was marked. Patient was brought back to the operating room. Anesthesia was induced. The extremity was prepped and draped in the usual sterile fashion. Timeout was taken to confirm patient name, laterality, and procedure prior to incision.  ? ?Attention was then directed to the right second toe where a fishmouth type incision was centered around the distal and middle phalanx of the second toe.  He had an ulceration to the tip  of the toe that probes directly to bone.  The incision was carried deep to the level of bone and a bone cutting forceps was used to resect through the middle phalanx.  This was sent for pathology and a piece of bone culture deep to the ulceration was sent for tissue culture as well.  There is no purulence found.  I then manually expressed and explored along the flexor tendons and into the plantar vault.  No purulence was found here either.  The incision was then thoroughly irrigated and closed with reapproximation of the skin using 4-0 nylon.  It was dressed with Xeroform dry sterile dressings and an Ace wrap. Patient tolerated the procedure well. ?  ?Disposition: ?Following a period of post-operative monitoring, patient will be transferred to the floor.  Overall the infection was fairly unimpressive, there is no gross purulence found.  While the area of bone deep to the ulceration appeared necrotic I do not think the level of this infection is the primary cause of his significant increased leukocytosis and petechial rash spreading up the leg. ? ?

## 2022-01-06 NOTE — Anesthesia Preprocedure Evaluation (Addendum)
Anesthesia Evaluation  ?Patient identified by MRN, date of birth, ID band ?Patient awake ? ? ? ?Reviewed: ?Allergy & Precautions, NPO status , Patient's Chart, lab work & pertinent test results ? ?Airway ?Mallampati: I ? ?TM Distance: >3 FB ?Neck ROM: Full ? ? ? Dental ? ?(+) Dental Advisory Given, Missing, Loose,  ?  ?Pulmonary ?sleep apnea and Continuous Positive Airway Pressure Ventilation , pneumonia, unresolved,  ?Per ID note: "No reported fever at home but 101.9 here.  CXR with atelectasis.  Started on ceftriaxone and azithromycin for presumed pneumonia though no new cough, no shortness of breath or significant hypoxia." ? ?When pt initially attached to monitors, spo2 80% on room air. With 6LPM O2 by facemask, still only saturating 90-95% ?  ?Pulmonary exam normal ?breath sounds clear to auscultation ? ? ? ? ? ? Cardiovascular ?hypertension, Pt. on medications ?Normal cardiovascular exam+ Valvular Problems/Murmurs (mild AS) AS  ?Rhythm:Regular Rate:Normal ? ?Echo 01/05/22 ??1. Left ventricular ejection fraction, by estimation, is 60 to 65%. The  ?left ventricle has normal function. The left ventricle has no regional  ?wall motion abnormalities. Left ventricular diastolic parameters are  ?indeterminate.  ??2. Right ventricular systolic function was not well visualized. The right  ?ventricular size is not well visualized. Tricuspid regurgitation signal is  ?inadequate for assessing PA pressure.  ??3. The mitral valve is grossly normal. Trivial mitral valve  ?regurgitation. No evidence of mitral stenosis.  ??4. The aortic valve was not well visualized. There is mild calcification  ?of the aortic valve. Aortic valve regurgitation is not visualized. Mild  ?aortic valve stenosis.  ??5. The inferior vena cava is normal in size with greater than 50%  ?respiratory variability, suggesting right atrial pressure of 3 mmHg.  ? ?stress test 2019 normal  ?  ?Neuro/Psych ?negative psych ROS  ?  GI/Hepatic ?negative GI ROS, Neg liver ROS,   ?Endo/Other  ?diabetes, Well Controlled, Type 2, Oral Hypoglycemic Agentsa1c 6.3 ? Renal/GU ?Renal InsufficiencyRenal diseaseCr 1.56  ?negative genitourinary ?  ?Musculoskeletal ? ?(+) Arthritis , Osteoarthritis,  Osteo R 2nd toe  ? Abdominal ?  ?Peds ? Hematology ?hct 32.8   ?Anesthesia Other Findings ? ? Reproductive/Obstetrics ? ?  ? ? ? ? ? ? ? ? ? ? ? ? ? ?  ?  ? ? ? ? ? ?Anesthesia Physical ?Anesthesia Plan ? ?ASA: 3 ? ?Anesthesia Plan: MAC and Regional  ? ?Post-op Pain Management: Regional block*  ? ?Induction: Intravenous ? ?PONV Risk Score and Plan: 2 and Propofol infusion, TIVA and Treatment may vary due to age or medical condition ? ?Airway Management Planned: Natural Airway and Simple Face Mask ? ?Additional Equipment: None ? ?Intra-op Plan:  ? ?Post-operative Plan: Extubation in OR ? ?Informed Consent: I have reviewed the patients History and Physical, chart, labs and discussed the procedure including the risks, benefits and alternatives for the proposed anesthesia with the patient or authorized representative who has indicated his/her understanding and acceptance.  ? ? ? ?Dental advisory given ? ?Plan Discussed with: CRNA ? ?Anesthesia Plan Comments: (Hypoxia on room air; I suspect pt will not tolerate much sedation so we will do regional anesthesia with minimal sedation.  ?Very edematous throughout, BNP has not been checked but echo from yesterday relatively normal. ?MSSA bacteremia, appears very ill)  ? ? ? ? ?Anesthesia Quick Evaluation ? ?

## 2022-01-06 NOTE — Anesthesia Procedure Notes (Signed)
Anesthesia Regional Block: Adductor canal block  ? ?Pre-Anesthetic Checklist: , timeout performed,  Correct Patient, Correct Site, Correct Laterality,  Correct Procedure, Correct Position, site marked,  Risks and benefits discussed,  Surgical consent,  Pre-op evaluation,  At surgeon's request and post-op pain management ? ?Laterality: Right ? ?Prep: Maximum Sterile Barrier Precautions used, chloraprep     ?  ?Needles:  ?Injection technique: Single-shot ? ?Needle Type: Echogenic Stimulator Needle   ? ? ?Needle Length: 9cm  ?Needle Gauge: 22  ? ? ? ?Additional Needles: ? ? ?Procedures:,,,, ultrasound used (permanent image in chart),,    ?Narrative:  ?Start time: 01/06/2022 7:25 AM ?End time: 01/06/2022 7:30 AM ?Injection made incrementally with aspirations every 5 mL. ? ?Performed by: Personally  ?Anesthesiologist: Pervis Hocking, DO ? ?Additional Notes: ?Monitors applied. No increased pain on injection. No increased resistance to injection. Injection made in 5cc increments. Good needle visualization. Patient tolerated procedure well.  ? ? ? ? ?

## 2022-01-06 NOTE — Progress Notes (Signed)
?PROGRESS NOTE ? ? ? ?Vincent White.  ASN:[id number] DOB: 1951-07-26 DOA: 01/04/2022 ?PCP: Leighton Ruff, MD (Inactive)  ? ? ?Brief Narrative:  ?Vincent White. is a 71 year old male with past medical history significant for essential pretension, hyperlipidemia, cervical spondylosis, gout, vitamin D deficiency, mitral valve insufficiency, pulmonary HTN, type 2 diabetes mellitus, OSA on CPAP who presented to [facility] ED on 4/6 via EMS with generalized weakness, fatigue, poor oral intake and inability to ambulate over the last 2-3 weeks.  Patient also complaining of increased knee pain thought to be secondary to an acute gout flare in which he was drinking "tart cherry juice"; as he heard this may help with gout flares.  Patient has not been eating or drinking much over the last week as well other than hard apple cider 6-12 cans daily.  Also reports right second toe wound following cutting his toenails.  He denies fever, no chills, no sore throat, no shortness of breath, no cough, no chest pain, no palpitations, no diaphoresis, no urinary symptoms, no visual changes. ? ?In the ED, temperature 99.4, HR 108, RR 31, BP 124/78, SPO2 94% on room air.  Sodium 118, potassium 4.8, chloride 81, CO2 21, glucose 190, BUN 63, creatinine 2.20, alkaline phosphatase 127, AST 67, ALT 70, total bilirubin 1.2.  Lactic acid 3.2.  WBC 31.0, hemoglobin 14.3, platelets 541.  COVID-19 PCR negative.  Influenza A/B PCR negative.  Urinalysis with large hemoglobin, trace leukocytes, negative nitrite, rare bacteria, greater than 11-20 WBCs.  Chest x-ray with hypoventilation, elevated right hemidiaphragm with right lower lobe airspace disease consistent with atelectasis.  Patient was given acetaminophen 1 g p.o. x1, azithromycin 5 mg IV, ceftriaxone 1 g IV, oxycodone 5 mg p.o. and 1 L NS bolus.  Hospital service consulted for further evaluation and management of sepsis. ? ?01/06/2022: Patient seen.  Available records reviewed.  Patient has undergone  amputation of the right second toe.  Likely, patient has previously undocumented liver cirrhosis.  We will pursue an abdominal ultrasound.  In the setting of possible acute kidney injury, will also start patient on midodrine and IV albumin.  We discontinued IV fluids.  Infectious disease input is appreciated.  We will check acute hepatitis panel and cryo.  Patient seems volume overloaded.  Will discontinue IV fluid.  Overall, patient is very sick with guarded prognosis. ? ? ?Assessment & Plan: ? ?Severe sepsis, POA ?MSSA septicemia, POA ?Right second toe diabetic wound/osteomyelitis ?Patient presenting to the ED with progressive weakness, fatigue, decreased ambulation and poor oral intake.  Patient was febrile up to 101.9, tachycardic, tachypneic with elevated lactic acid of 3.2 on admission.  WBC count elevated 31.0.  Chest x-ray with right lower lobe airspace disease consistent with atelectasis versus pneumonia.  Right second toe x-ray with skin and soft tissue irregularity around the distal digit, indistinct cortex of the distal tuft of the second distal phalanx suspicious for osteomyelitis. ?--Infectious disease and podiatry consulted ?--WBC 31.0>26.7 ?--Blood cultures x2 4/6: BCID + MSSA; awaiting further identification/susceptibilities ?--TTE: Pending ?--Cefazolin 2 g IV every 8 hours ?--CBC daily. ?--Supportive care, pain control, IV fluid hydration ?01/06/2022: Infectious disease and podiatry input is appreciated.  Patient has undergone amputation of the right second toe.  The rash on the right lower leg may be related to the patient's undiagnosed likely liver disease/cirrhosis.  Viral hepatitis status is unknown.  We will check acute hepatitis panel.  Check cryoglobulin level.  Right upper quadrant ultrasound to confirm if patient has cirrhosis.  Cirrhosis  could be secondary to the alcohol on NASH or combined effect of both.  Guarded prognosis.  Antibiotics as per infectious disease  team. ? ?Hyponatremia ?Sodium 118 on admission.  Etiology likely secondary to EtOH abuse complicated by severe sepsis as above and likely dehydration from poor oral intake in the days preceding hospitalization.  Urine osmolality 316, urine sodium less than 10. ?--Na 118>>122 ?--Continue IV fluid hydration w/ NA at 16m/hr ?--Na level q12h ?-- Monitor on telemetry ?01/06/2022: Hyponatremia is likely multifactorial.  Suspect secondary to combined alcohol abuse and liver disease.  Will discontinue IV fluids.  Avoid excessive rise in sodium level. ? ?Lower extremity edema ?Etiology likely secondary to acute infectious process.  Previous TTE with LVEF 627-78% normal diastolic parameters on 62/42/3536 ?--Vascular duplex ultrasound bilateral lower extremities to rule out DVT ?--TTE pending as above ?--Lower extremity elevation ?01/06/2022: Likely related to undiagnosed liver disease.  Will likely improve with IV albumin. ? ?Acute renal failure ?Creatinine 2.20 on admission.  Etiology likely secondary to prerenal azotemia in the setting of dehydration and poor oral intake in the days preceding hospitalization versus ATN from underlying severe sepsis as above with septicemia.  Additionally, patient with acute urinary retention likely contributing as well. ?--Cr 2.20>>1.81 ?--Holding home losartan/metformin ?--IV fluid hydration ?--Foley catheter ordered/7 ?--Strict I's and O's ?--Avoid nephrotoxins, renally dose all medications ?--BMP daily ?01/06/2022: Hopefully, will not delay with hepatorenal syndrome.  Start IV albumin and midodrine. ? ?Hx Gout ?Patient with reported history of gout.  Takes Celebrex and Norco as needed.  Not on allopurinol outpatient.  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  Patient has been having trouble ambulating over the last 2 weeks and thought this was too due to acute gout flare that he was self treating with  "tart cherry juice".  Bilateral knees with pain on active/passive range of motion, but no erythema/fluctuance and nontender to palpation.  Has been seen by orthopedics in the past, Dr. GGladstone Lighter last seen in clinic 2019. ?--Check bilateral knee x-rays ?--Check uric acid ?--Colchicine 0.3 mg BID ?--Probenecid mg PO BID ?--Oxycodone 5 mg PO q4h PRN ? ?Transaminitis ?Etiology likely secondary to acute infectious process as above.  Patient on statin outpatient. ?--AST 105>67 ?--ALT 79>70 ?--Tbili 2.0>1.2 ?-- CMP daily ?01/06/2022: Likely previously undiagnosed liver disease.  Repeat CMP in the morning. ? ?Acute urinary retention ?Patient continues with urinary retention, required 3 in and out catheterizations.  Repeat bladder scan and this morning with greater than 500 mL present.  Likely contributing to renal failure. ?--Foley catheter to be placed 4/7 ? ?EtOH abuse ?Patient continues to endorse significant alcohol use at home with hard apple cider. ?--CIWA protocol with symptom triggered Ativan ?--Thiamine, folic acid, multivitamin ?--Discussed EtOH cessation, TOC for subs abuse resources ? ?Type 2 diabetes mellitus ?Home regimen includes metformin 500 mg p.o. twice daily.  Hemoglobin A1c 6.3, well controlled. ?--Holding oral hypoglycemics while inpatient ?--SSI for coverage ?--CBGs qAC/HS ? ?Essential hypertension ?On losartan 100 mg p.o. daily and amlodipine 10 mg p.o. daily at home ?--Continue amlodipine 10 mg p.o. daily ?--Holding losartan for AKI as above ?--Continue to monitor blood pressure closely ? ?Hyperlipidemia ?--Holding home statin secondary to elevated LFTs ? ?OSA ?--Continue nocturnal CPAP ? ? ? ?DVT prophylaxis: SCDs Start: 01/04/22 1254 ? ?  Code Status: Full Code ?Family Communication: Updated spouse present at bedside this morning ? ?Disposition Plan:  ?Level of care: Stepdown ?  Status is: Inpatient ?Remains inpatient appropriate because: IV antibiotics, IV fluid hydration, pending podiatry/ID  consultation, pending TTE, likely will need prolonged antibiotics given MSSA septicemia ?  ? ?Consultants:  ?Podiatry ?Infectious disease ? ?Procedures:  ?TTE: Pending ?Vascular duplex ultrasound bilateral lower extremities: Pending ? ?Ant

## 2022-01-06 NOTE — Progress Notes (Signed)
History and Physical Interval Note: ? ?01/06/2022 ?7:30 AM ? ?Vincent White.  has presented today for surgery, with the diagnosis of osteomyelitis of second right toe.  The various methods of treatment have been discussed with the patient and family. After consideration of risks, benefits and other options for treatment, the patient has consented to   ?Procedure(s): ?AMPUTATION TOE (Right) as a surgical intervention.  The patient's history has been reviewed, patient examined, no change in status, stable for surgery.  I have reviewed the patient's chart and labs.  Questions were answered to the patient's satisfaction.   ? ? ?Stephan Minister Bryer Cozzolino ? ? ?

## 2022-01-06 NOTE — Anesthesia Postprocedure Evaluation (Signed)
Anesthesia Post Note ? ?Patient: Vincent White. ? ?Procedure(s) Performed: AMPUTATION TOE (Right: Toe) ? ?  ? ?Patient location during evaluation: PACU ?Anesthesia Type: Regional and MAC ?Level of consciousness: awake and alert ?Pain management: pain level controlled ?Vital Signs Assessment: post-procedure vital signs reviewed and stable ?Respiratory status: spontaneous breathing, nonlabored ventilation and respiratory function stable ?Cardiovascular status: blood pressure returned to baseline and stable ?Postop Assessment: no apparent nausea or vomiting ?Anesthetic complications: no ? ? ?No notable events documented. ? ?Last Vitals:  ?Vitals:  ? 01/06/22 0845 01/06/22 0900  ?BP: 106/65 117/68  ?Pulse: 87 86  ?Resp: 17 17  ?Temp:  37.4 ?C  ?SpO2: 95% 96%  ?  ?Last Pain:  ?Vitals:  ? 01/06/22 0900  ?TempSrc:   ?PainSc: 0-No pain  ? ? ?  ?  ?  ?  ?  ?  ? ?Jarome Matin Aarushi Hemric ? ? ? ? ?

## 2022-01-06 NOTE — Progress Notes (Signed)
?  [facility] for Infectious Disease ? ? ?Reason for visit: Follow up on bacteremia ? ?Interval History: fever curve trending down, WBC down to 24.7.  s/p toe amputation by Dr. Sherryle Lis and no gross infection noted.   ?Day 3 antibiotics ? ?Physical Exam: ?Constitutional:  ?Vitals:  ? 01/06/22 0900 01/06/22 1000  ?BP: 117/68 109/63  ?Pulse: 86 87  ?Resp: 17 18  ?Temp: 99.3 ?F (37.4 ?C)   ?SpO2: 96% 95%  ? patient appears in NAD ?Respiratory: Normal respiratory effort; CTA B ?Cardiovascular: RRR ?GI: soft, nt, nd ? ?Review of Systems: ?Gastrointestinal: negative for nausea and diarrhea ?Integument/breast: negative for rash ? ?Lab Results  ?Component Value Date  ? WBC 24.7 (H) 01/06/2022  ? HGB 11.2 (L) 01/06/2022  ? HCT 32.8 (L) 01/06/2022  ? MCV 88.6 01/06/2022  ? PLT 324 01/06/2022  ?  ?Lab Results  ?Component Value Date  ? CREATININE 1.56 (H) 01/06/2022  ? BUN 48 (H) 01/06/2022  ? NA 125 (L) 01/06/2022  ? K 4.0 01/06/2022  ? CL 93 (L) 01/06/2022  ? CO2 20 (L) 01/06/2022  ?  ?Lab Results  ?Component Value Date  ? ALT 76 (H) 01/06/2022  ? AST 139 (H) 01/06/2022  ? ALKPHOS 171 (H) 01/06/2022  ?  ? ?Microbiology: ?Recent Results (from the past 240 hour(s))  ?Blood Culture (routine x 2)     Status: None (Preliminary result)  ? Collection Time: 01/04/22 10:55 AM  ? Specimen: BLOOD  ?Result Value Ref Range Status  ? Specimen Description   Final  ?  BLOOD LEFT ANTECUBITAL ?Performed at [facility], [place] [address], [place], [place] [address] ?  ? Special Requests   Final  ?  BOTTLES DRAWN AEROBIC AND ANAEROBIC Blood Culture adequate volume ?Performed at [facility], [place] [address] [place], [place] [address] ?  ? Culture  Setup Time   Final  ?  GRAM POSITIVE COCCI ?IN BOTH AEROBIC AND ANAEROBIC BOTTLES ?CRITICAL VALUE NOTED.  VALUE IS CONSISTENT WITH PREVIOUSLY REPORTED AND CALLED VALUE. ?  ? Culture   Final  ?  NO GROWTH 1 DAY ?Performed at [facility], [place] [address] [place], [place] [address] ?  ? Report Status PENDING  Incomplete  ?Blood Culture (routine x 2)     Status: Abnormal (Preliminary result)  ? Collection Time: 01/04/22 10:56 AM  ? Specimen: BLOOD  ?Result Value Ref Range Status  ? Specimen Description   Final  ?  BLOOD RIGHT ANTECUBITAL ?Performed at [facility], [place] [address], [place], [place] [address] ?  ? Special Requests   Final  ?  BOTTLES DRAWN AEROBIC AND ANAEROBIC Blood Culture results may not be optimal due to an excessive volume of blood received in culture bottles ?Performed at [facility], [place] [address], [place], [place] [address] ?  ? Culture  Setup Time   Final  ?  GRAM POSITIVE COCCI ?AEROBIC BOTTLE ONLY ?IN BOTH AEROBIC AND ANAEROBIC BOTTLES ?CRITICAL RESULT CALLED TO, READ BACK BY AND VERIFIED WITH: ?PHARMD ELLEN JACKSON 01/05/22'@2'$ :14 BY TW ?  ? Culture (A)  Final  ?  STAPHYLOCOCCUS AUREUS ?SUSCEPTIBILITIES TO FOLLOW ?Performed at [facility], [place] [address] [place], [place] [address] ?  ? Report Status PENDING  Incomplete  ?Blood Culture ID Panel (Reflexed)     Status: Abnormal  ? Collection Time: 01/04/22 10:56 AM  ?Result Value Ref Range Status  ? Enterococcus faecalis  NOT DETECTED NOT DETECTED Final  ? Enterococcus Faecium NOT DETECTED NOT DETECTED Final  ? Listeria monocytogenes NOT DETECTED NOT DETECTED Final  ? Staphylococcus species DETECTED (A) NOT DETECTED Final  ?  Comment: CRITICAL RESULT CALLED TO, READ BACK BY AND VERIFIED WITH: ?PHARMD ELLEN JACKSON 01/05/22'@2'$ :14 BY TW ?  ? Staphylococcus aureus (BCID) DETECTED (A) NOT DETECTED Final  ?  Comment: CRITICAL RESULT CALLED TO, READ BACK BY AND VERIFIED WITH: ?PHARMD ELLEN JACKSON 01/05/22'@2'$ :14 BY TW ?  ? Staphylococcus epidermidis NOT DETECTED NOT DETECTED Final  ? Staphylococcus lugdunensis NOT DETECTED NOT DETECTED Final  ? Streptococcus species NOT DETECTED NOT DETECTED Final  ? Streptococcus agalactiae NOT DETECTED NOT DETECTED Final  ?  Streptococcus pneumoniae NOT DETECTED NOT DETECTED Final  ? Streptococcus pyogenes NOT DETECTED NOT DETECTED Final  ? A.calcoaceticus-baumannii NOT DETECTED NOT DETECTED Final  ? Bacteroides fragilis NOT DETECTED NOT DETECTED Final  ? Enterobacterales NOT DETECTED NOT DETECTED Final  ? Enterobacter cloacae complex NOT DETECTED NOT DETECTED Final  ? Escherichia coli NOT DETECTED NOT DETECTED Final  ? Klebsiella aerogenes NOT DETECTED NOT DETECTED Final  ? Klebsiella oxytoca NOT DETECTED NOT DETECTED Final  ? Klebsiella pneumoniae NOT DETECTED NOT DETECTED Final  ? Proteus species NOT DETECTED NOT DETECTED Final  ? Salmonella species NOT DETECTED NOT DETECTED Final  ? Serratia marcescens NOT DETECTED NOT DETECTED Final  ? Haemophilus influenzae NOT DETECTED NOT DETECTED Final  ? Neisseria meningitidis NOT DETECTED NOT DETECTED Final  ? Pseudomonas aeruginosa NOT DETECTED NOT DETECTED Final  ? Stenotrophomonas maltophilia NOT DETECTED NOT DETECTED Final  ? Candida albicans NOT DETECTED NOT DETECTED Final  ? Candida auris NOT DETECTED NOT DETECTED Final  ? Candida glabrata NOT DETECTED NOT DETECTED Final  ? Candida krusei NOT DETECTED NOT DETECTED Final  ? Candida parapsilosis NOT DETECTED NOT DETECTED Final  ? Candida tropicalis NOT DETECTED NOT DETECTED Final  ? Cryptococcus neoformans/gattii NOT DETECTED NOT DETECTED Final  ? Meth resistant mecA/C and MREJ NOT DETECTED NOT DETECTED Final  ?  Comment: Performed at [facility], [address] [place], [place] [address]  ?Resp Panel by RT-PCR (Flu A&B, Covid) Nasopharyngeal Swab     Status: None  ? Collection Time: 01/04/22 12:41 PM  ? Specimen: Nasopharyngeal Swab; Nasopharyngeal(NP) swabs in vial transport medium  ?Result Value Ref Range Status  ? SARS Coronavirus 2 by RT PCR NEGATIVE NEGATIVE Final  ?  Comment: (NOTE) ?SARS-CoV-2 target nucleic acids are NOT DETECTED. ? ?The SARS-CoV-2 RNA is generally detectable in upper respiratory ?specimens during the  acute phase of infection. The lowest ?concentration of SARS-CoV-2 viral copies this assay can detect is ?138 copies/mL. A negative result does not preclude SARS-Cov-2 ?infection and should not be used as the sole basis for treatment or ?other patient management decisions. A negative result may occur with  ?improper specimen collection/handling, submission of specimen other ?than nasopharyngeal swab, presence of viral mutation(s) within the ?areas targeted by this assay, and inadequate number of viral ?copies(<138 copies/mL). A negative result must be combined with ?clinical observations, patient history, and epidemiological ?information. The expected result is Negative. ? ?Fact Sheet for Patients:  ?EntrepreneurPulse.com.au ? ?Fact Sheet for Healthcare Providers:  ?IncredibleEmployment.be ? ?This test is no t yet approved or cleared by the [place] FDA and  ?has been authorized for detection and/or diagnosis of SARS-CoV-2 by ?FDA under an Emergency Use Authorization (EUA). This EUA will remain  ?in effect (meaning this test can be used)  for the duration of the ?COVID-19 declaration under Section 564(b)(1) of the Act, 21 ?U.S.C.section 360bbb-3(b)(1), unless the authorization is terminated  ?or revoked sooner.  ? ? ?  ? Influenza A by PCR NEGATIVE NEGATIVE Final  ? Influenza B by PCR NEGATIVE NEGATIVE Final  ?  Comment: (NOTE) ?The Xpert Xpress SARS-CoV-2/FLU/RSV plus assay is intended as an aid ?in the diagnosis of influenza from Nasopharyngeal swab specimens and ?should not be used as a sole basis for treatment. Nasal washings and ?aspirates are unacceptable for Xpert Xpress SARS-CoV-2/FLU/RSV ?testing. ? ?Fact Sheet for Patients: ?EntrepreneurPulse.com.au ? ?Fact Sheet for Healthcare Providers: ?IncredibleEmployment.be ? ?This test is not yet approved or cleared by the [place] FDA and ?has been authorized for detection and/or  diagnosis of SARS-CoV-2 by ?FDA under an Emergency Use Authorization (EUA). This EUA will remain ?in effect (meaning this test can be used) for the duration of the ?COVID-19 declaration under Section 564(b)(1) of the

## 2022-01-06 NOTE — Transfer of Care (Signed)
Immediate Anesthesia Transfer of Care Note ? ?Patient: Aidan Moten. ? ?Procedure(s) Performed: AMPUTATION TOE (Right: Toe) ? ?Patient Location: PACU ? ?Anesthesia Type:Regional and MAC combined with regional for post-op pain ? ?Level of Consciousness: awake, alert , oriented and patient cooperative ? ?Airway & Oxygen Therapy: Patient Spontanous Breathing and Patient connected to face mask oxygen ? ?Post-op Assessment: Report given to RN and Post -op Vital signs reviewed and stable ? ?Post vital signs: Reviewed and stable ? ?Last Vitals:  ?Vitals Value Taken Time  ?BP 115/66 01/06/22 0822  ?Temp    ?Pulse 91 01/06/22 0824  ?Resp 23 01/06/22 0825  ?SpO2 91 % 01/06/22 0824  ?Vitals shown include unvalidated device data. ? ?Last Pain:  ?Vitals:  ? 01/06/22 0355  ?TempSrc: Oral  ?PainSc:   ?   ? ?  ? ?Complications: No notable events documented. ?

## 2022-01-06 NOTE — Anesthesia Procedure Notes (Signed)
Anesthesia Regional Block: Popliteal block  ? ?Pre-Anesthetic Checklist: , timeout performed,  Correct Patient, Correct Site, Correct Laterality,  Correct Procedure, Correct Position, site marked,  Risks and benefits discussed,  Surgical consent,  Pre-op evaluation,  At surgeon's request and post-op pain management ? ?Laterality: Right ? ?Prep: Maximum Sterile Barrier Precautions used, chloraprep     ?  ?Needles:  ?Injection technique: Single-shot ? ?Needle Type: Echogenic Stimulator Needle   ? ? ?Needle Length: 9cm  ?Needle Gauge: 22  ? ? ? ?Additional Needles: ? ? ?Procedures:,,,, ultrasound used (permanent image in chart),,    ?Narrative:  ?Start time: 01/06/2022 7:20 AM ?End time: 01/06/2022 7:25 AM ?Injection made incrementally with aspirations every 5 mL. ? ?Performed by: Personally  ?Anesthesiologist: Pervis Hocking, DO ? ?Additional Notes: ?Monitors applied. No increased pain on injection. No increased resistance to injection. Injection made in 5cc increments. Good needle visualization. Patient tolerated procedure well.  ? ? ? ? ?

## 2022-01-06 NOTE — Brief Op Note (Signed)
01/06/2022 ? ?8:20 AM ? ?PATIENT:  Vincent White.  71 y.o. male ? ?PRE-OPERATIVE DIAGNOSIS:  INFECTED RIGHT 2ND TOE ? ?POST-OPERATIVE DIAGNOSIS:  INFECTED RIGHT 2ND TOE ? ?PROCEDURE:  Procedure(s): ?AMPUTATION TOE (Right) ? ?SURGEON:  Surgeon(s) and Role: ?   * Meital Riehl, Stephan Minister, DPM - Primary ? ?Assistants: None ? ?ANESTHESIA:   regional ? ?EBL: Minimal ? ?BLOOD ADMINISTERED:none ? ?DRAINS: none  ? ?LOCAL MEDICATIONS USED:  NONE ? ?SPECIMEN: Right second toe middle and distal phalanx ? ?DISPOSITION OF SPECIMEN: Pathology and bone culture microbiology ? ?COUNTS:  YES ? ?TOURNIQUET:   ?Total Tourniquet Time Documented: ?Thigh (Right) - 17 minutes ?Total: Thigh (Right) - 17 minutes ? ? ?DICTATION: .Note written in EPIC ? ?PLAN OF CARE: Admit to inpatient  ? ?PATIENT DISPOSITION:  PACU - hemodynamically stable. ?  ?Delay start of Pharmacological VTE agent (>24hrs) due to surgical blood loss or risk of bleeding: no ? ?

## 2022-01-07 ENCOUNTER — Encounter (HOSPITAL_COMMUNITY): Payer: Self-pay | Admitting: Podiatry

## 2022-01-07 DIAGNOSIS — E871 Hypo-osmolality and hyponatremia: Secondary | ICD-10-CM | POA: Diagnosis not present

## 2022-01-07 DIAGNOSIS — L8990 Pressure ulcer of unspecified site, unspecified stage: Secondary | ICD-10-CM | POA: Insufficient documentation

## 2022-01-07 LAB — GLUCOSE, CAPILLARY
Glucose-Capillary: 159 mg/dL — ABNORMAL HIGH (ref 70–99)
Glucose-Capillary: 201 mg/dL — ABNORMAL HIGH (ref 70–99)
Glucose-Capillary: 246 mg/dL — ABNORMAL HIGH (ref 70–99)

## 2022-01-07 LAB — CBC WITH DIFFERENTIAL/PLATELET
Abs Immature Granulocytes: 0.62 10*3/uL — ABNORMAL HIGH (ref 0.00–0.07)
Basophils Absolute: 0.1 10*3/uL (ref 0.0–0.1)
Basophils Relative: 0 %
Eosinophils Absolute: 0 10*3/uL (ref 0.0–0.5)
Eosinophils Relative: 0 %
HCT: 29.3 % — ABNORMAL LOW (ref 39.0–52.0)
Hemoglobin: 10.3 g/dL — ABNORMAL LOW (ref 13.0–17.0)
Immature Granulocytes: 3 %
Lymphocytes Relative: 2 %
Lymphs Abs: 0.6 10*3/uL — ABNORMAL LOW (ref 0.7–4.0)
MCH: 30.8 pg (ref 26.0–34.0)
MCHC: 35.2 g/dL (ref 30.0–36.0)
MCV: 87.7 fL (ref 80.0–100.0)
Monocytes Absolute: 0.6 10*3/uL (ref 0.1–1.0)
Monocytes Relative: 3 %
Neutro Abs: 23.2 10*3/uL — ABNORMAL HIGH (ref 1.7–7.7)
Neutrophils Relative %: 92 %
Platelets: 372 10*3/uL (ref 150–400)
RBC: 3.34 MIL/uL — ABNORMAL LOW (ref 4.22–5.81)
RDW: 14.5 % (ref 11.5–15.5)
WBC: 25.1 10*3/uL — ABNORMAL HIGH (ref 4.0–10.5)
nRBC: 0 % (ref 0.0–0.2)

## 2022-01-07 LAB — COMPREHENSIVE METABOLIC PANEL
ALT: 22 U/L (ref 0–44)
AST: 46 U/L — ABNORMAL HIGH (ref 15–41)
Albumin: 2.4 g/dL — ABNORMAL LOW (ref 3.5–5.0)
Alkaline Phosphatase: 139 U/L — ABNORMAL HIGH (ref 38–126)
Anion gap: 12 (ref 5–15)
BUN: 56 mg/dL — ABNORMAL HIGH (ref 8–23)
CO2: 21 mmol/L — ABNORMAL LOW (ref 22–32)
Calcium: 8.1 mg/dL — ABNORMAL LOW (ref 8.9–10.3)
Chloride: 99 mmol/L (ref 98–111)
Creatinine, Ser: 1.5 mg/dL — ABNORMAL HIGH (ref 0.61–1.24)
GFR, Estimated: 50 mL/min — ABNORMAL LOW (ref >60)
Glucose, Bld: 137 mg/dL — ABNORMAL HIGH (ref 70–99)
Potassium: 4 mmol/L (ref 3.5–5.1)
Sodium: 132 mmol/L — ABNORMAL LOW (ref 135–145)
Total Bilirubin: 1 mg/dL (ref 0.3–1.2)
Total Protein: 5.8 g/dL — ABNORMAL LOW (ref 6.5–8.1)

## 2022-01-07 LAB — CULTURE, BLOOD (ROUTINE X 2): Special Requests: ADEQUATE

## 2022-01-07 LAB — MAGNESIUM: Magnesium: 2.6 mg/dL — ABNORMAL HIGH (ref 1.7–2.4)

## 2022-01-07 LAB — SODIUM: Sodium: 132 mmol/L — ABNORMAL LOW (ref 135–145)

## 2022-01-07 LAB — URINE CULTURE: Culture: >=100000 — AB

## 2022-01-07 LAB — PHOSPHORUS: Phosphorus: 4.4 mg/dL (ref 2.5–4.6)

## 2022-01-07 MED ORDER — ALBUMIN HUMAN 25 % IV SOLN
25.0000 g | Freq: Four times a day (QID) | INTRAVENOUS | Status: AC
  Administered 2022-01-07 – 2022-01-08 (×4): 25 g via INTRAVENOUS
  Filled 2022-01-07 (×3): qty 100

## 2022-01-07 NOTE — Progress Notes (Signed)
?PROGRESS NOTE ? ? ? ?Vincent White.  CLE:[id number] DOB: [birth year] DOA: 01/04/2022 ?PCP: Leighton Ruff, MD (Inactive)  ? ? ?Brief Narrative:  ?Vincent White. is a 71 year old male with past medical history significant for essential pretension, hyperlipidemia, cervical spondylosis, gout, vitamin D deficiency, mitral valve insufficiency, pulmonary HTN, type 2 diabetes mellitus, OSA on CPAP who presented to [facility] ED on 4/6 via EMS with generalized weakness, fatigue, poor oral intake and inability to ambulate over the last 2-3 weeks.  Patient also complaining of increased knee pain thought to be secondary to an acute gout flare in which he was drinking "tart cherry juice"; as he heard this may help with gout flares.  Patient has not been eating or drinking much over the last week as well other than hard apple cider 6-12 cans daily.  Also reports right second toe wound following cutting his toenails.  He denies fever, no chills, no sore throat, no shortness of breath, no cough, no chest pain, no palpitations, no diaphoresis, no urinary symptoms, no visual changes. ? ?In the ED, temperature 99.4, HR 108, RR 31, BP 124/78, SPO2 94% on room air.  Sodium 118, potassium 4.8, chloride 81, CO2 21, glucose 190, BUN 63, creatinine 2.20, alkaline phosphatase 127, AST 67, ALT 70, total bilirubin 1.2.  Lactic acid 3.2.  WBC 31.0, hemoglobin 14.3, platelets 541.  COVID-19 PCR negative.  Influenza A/B PCR negative.  Urinalysis with large hemoglobin, trace leukocytes, negative nitrite, rare bacteria, greater than 11-20 WBCs.  Chest x-ray with hypoventilation, elevated right hemidiaphragm with right lower lobe airspace disease consistent with atelectasis.  Patient was given acetaminophen 1 g p.o. x1, azithromycin 5 mg IV, ceftriaxone 1 g IV, oxycodone 5 mg p.o. and 1 L NS bolus.  Hospital service consulted for further evaluation and management of sepsis. ? ?01/06/2022: Patient seen.  Available records reviewed.  Patient has undergone  amputation of the right second toe.  Likely, patient has previously undocumented liver cirrhosis.  We will pursue an abdominal ultrasound.  In the setting of possible acute kidney injury, will also start patient on midodrine and IV albumin.  We discontinued IV fluids.  Infectious disease input is appreciated.  We will check acute hepatitis panel and cryo.  Patient seems volume overloaded.  Will discontinue IV fluid.  Overall, patient is very sick with guarded prognosis. ? ?01/07/2022: Patient seen alongside patient's wife.  Patient looks better today.  Patient looks less edematous today.  We will continue IV albumin.  Abdominal ultrasound revealed well distended gallbladder with gallbladder sludge.  No cholelithiasis was reported.  Patient is documented to have cirrhosis.  Blood and urine cultures have grown MSSA.  Communicated with infectious disease physician, Dr.Comer, patient will need TEE.  TTE is nonrevealing.  Normal EF.  Diastolic function could not be    ? ? ?Assessment & Plan: ? ?Severe sepsis, POA ?MSSA septicemia, POA ?Right second toe diabetic wound/osteomyelitis ?Patient presenting to the ED with progressive weakness, fatigue, decreased ambulation and poor oral intake.  Patient was febrile up to 101.9, tachycardic, tachypneic with elevated lactic acid of 3.2 on admission.  WBC count elevated 31.0.  Chest x-ray with right lower lobe airspace disease consistent with atelectasis versus pneumonia.  Right second toe x-ray with skin and soft tissue irregularity around the distal digit, indistinct cortex of the distal tuft of the second distal phalanx suspicious for osteomyelitis. ?--Infectious disease and podiatry consulted ?--WBC 31.0>26.7 ?--Blood cultures x2 4/6: BCID + MSSA; awaiting further identification/susceptibilities ?--TTE: Pending ?--Cefazolin  2 g IV every 8 hours ?--CBC daily. ?--Supportive care, pain control, IV fluid hydration ?01/06/2022: Infectious disease and podiatry input is appreciated.   Patient has undergone amputation of the right second toe.  The rash on the right lower leg may be related to the patient's undiagnosed likely liver disease/cirrhosis.  Viral hepatitis status is unknown.  We will check acute hepatitis panel.  Check cryoglobulin level.  Right upper quadrant ultrasound to confirm if patient has cirrhosis.  Cirrhosis could be secondary to the alcohol on NASH or combined effect of both.  Guarded prognosis.  Antibiotics as per infectious disease team. ?01/07/2022: See above documentation.  Blood and urine cultures have grown MSSA.  Patient will need TEE.  We will consult cardiology team.  Patient is currently on IV cefazolin.  Infectious diseases directing care. ? ?Hyponatremia ?Sodium 118 on admission.  Etiology likely secondary to EtOH abuse complicated by severe sepsis as above and likely dehydration from poor oral intake in the days preceding hospitalization.  Urine osmolality 316, urine sodium less than 10. ?--Na 118>>122 ?--Continue IV fluid hydration w/ NA at 145m/hr ?--Na level q12h ?-- Monitor on telemetry ?01/06/2022: Hyponatremia is likely multifactorial.  Suspect secondary to combined alcohol abuse and liver disease.  Will discontinue IV fluids.  Avoid excessive rise in sodium level. ?01/07/2022: Sodium is 132 today.  Sodium is improving with IV albumin.  Mild improvement in renal function also noted.  Continue IV albumin. ? ?Lower extremity edema ?Etiology likely secondary to acute infectious process.  Previous TTE with LVEF 631-54% normal diastolic parameters on 60/05/6760 ?--Vascular duplex ultrasound bilateral lower extremities to rule out DVT ?--TTE pending as above ?--Lower extremity elevation ?01/06/2022: Likely related to undiagnosed liver disease.  Will likely improve with IV albumin. ?01/07/2022: Slowly improving. ? ?Acute renal failure ?Creatinine 2.20 on admission.  Etiology likely secondary to prerenal azotemia in the setting of dehydration and poor oral intake in the days  preceding hospitalization versus ATN from underlying severe sepsis as above with septicemia.  Additionally, patient with acute urinary retention likely contributing as well. ?--Cr 2.20>>1.81 ?--Holding home losartan/metformin ?--IV fluid hydration ?--Foley catheter ordered/7 ?--Strict I's and O's ?--Avoid nephrotoxins, renally dose all medications ?--BMP daily ?01/06/2022: Hopefully, will not delay with hepatorenal syndrome.  Start IV albumin and midodrine. ?01/07/2022: Slowly improving.  Possibly secondary to ATN/ineffective arterial circulatory volume.  Continue IV albumin and midodrine. ? ?Hx Gout ?Patient with reported history of gout.  Takes Celebrex and Norco as needed.  Not on allopurinol outpatient.  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  Patient has been having trouble ambulating over the last 2 weeks and thought this was too due to acute gout flare that he was self treating with "tart cherry juice".  Bilateral knees with pain on active/passive range of motion, but no erythema/fluctuance and nontender to palpation.  Has been seen by orthopedics in the past, Dr. GGladstone Lighter last seen in clinic 2019. ?--Check bilateral knee x-rays ?--Check uric acid ?--Colchicine 0.3 mg BID ?--Probenecid mg PO BID ?--Oxycodone 5 mg PO q4h PRN ? ?Transaminitis ?Etiology likely secondary to acute infectious process as above.  Patient on statin outpatient. ?--AST 105>67 ?--ALT 79>70 ?--Tbili 2.0>1.2 ?-- CMP daily ?01/06/2022: Likely previously undiagnosed liver disease.  Repeat CMP in the morning. ?01/07/2022: Patient has liver cirrhosis.  Not currently decompensated.  Patient has been advised to quit alcohol use. ? ?Acute urinary retention ?Patient continues with urinary  retention, required 3 in and out catheterizations.  Repeat bladder scan and this morning with greater than 500 mL present.  Likely contributing to renal failure. ?--Foley catheter  to be placed 4/7 ? ?EtOH abuse ?Patient continues to endorse significant alcohol use at home with hard apple cider. ?--CIWA protocol with symptom triggered Ativan ?--Thiamine, folic acid, multivitamin ?--D

## 2022-01-08 ENCOUNTER — Inpatient Hospital Stay (HOSPITAL_COMMUNITY): Payer: Medicare Other

## 2022-01-08 DIAGNOSIS — J189 Pneumonia, unspecified organism: Secondary | ICD-10-CM | POA: Diagnosis not present

## 2022-01-08 DIAGNOSIS — E871 Hypo-osmolality and hyponatremia: Secondary | ICD-10-CM | POA: Diagnosis not present

## 2022-01-08 DIAGNOSIS — R7881 Bacteremia: Secondary | ICD-10-CM

## 2022-01-08 DIAGNOSIS — S99921S Unspecified injury of right foot, sequela: Secondary | ICD-10-CM | POA: Diagnosis not present

## 2022-01-08 DIAGNOSIS — M00072 Staphylococcal arthritis, left ankle and foot: Secondary | ICD-10-CM

## 2022-01-08 DIAGNOSIS — E1169 Type 2 diabetes mellitus with other specified complication: Secondary | ICD-10-CM

## 2022-01-08 LAB — GLUCOSE, CAPILLARY
Glucose-Capillary: 119 mg/dL — ABNORMAL HIGH (ref 70–99)
Glucose-Capillary: 163 mg/dL — ABNORMAL HIGH (ref 70–99)
Glucose-Capillary: 149 mg/dL — ABNORMAL HIGH (ref 70–99)
Glucose-Capillary: 118 mg/dL — ABNORMAL HIGH (ref 70–99)

## 2022-01-08 LAB — CBC
HCT: 29.6 % — ABNORMAL LOW (ref 39.0–52.0)
Hemoglobin: 10.3 g/dL — ABNORMAL LOW (ref 13.0–17.0)
MCH: 30.3 pg (ref 26.0–34.0)
MCHC: 34.8 g/dL (ref 30.0–36.0)
MCV: 87.1 fL (ref 80.0–100.0)
Platelets: 444 10*3/uL — ABNORMAL HIGH (ref 150–400)
RBC: 3.4 MIL/uL — ABNORMAL LOW (ref 4.22–5.81)
RDW: 14.4 % (ref 11.5–15.5)
WBC: 21.9 10*3/uL — ABNORMAL HIGH (ref 4.0–10.5)
nRBC: 0 % (ref 0.0–0.2)

## 2022-01-08 LAB — COMPREHENSIVE METABOLIC PANEL
ALT: 18 U/L (ref 0–44)
AST: 50 U/L — ABNORMAL HIGH (ref 15–41)
Albumin: 3 g/dL — ABNORMAL LOW (ref 3.5–5.0)
Alkaline Phosphatase: 100 U/L (ref 38–126)
Anion gap: 8 (ref 5–15)
BUN: 50 mg/dL — ABNORMAL HIGH (ref 8–23)
CO2: 23 mmol/L (ref 22–32)
Calcium: 8.5 mg/dL — ABNORMAL LOW (ref 8.9–10.3)
Chloride: 103 mmol/L (ref 98–111)
Creatinine, Ser: 0.94 mg/dL (ref 0.61–1.24)
GFR, Estimated: >60 mL/min (ref >60)
Glucose, Bld: 115 mg/dL — ABNORMAL HIGH (ref 70–99)
Potassium: 3.6 mmol/L (ref 3.5–5.1)
Sodium: 134 mmol/L — ABNORMAL LOW (ref 135–145)
Total Bilirubin: 1 mg/dL (ref 0.3–1.2)
Total Protein: 6 g/dL — ABNORMAL LOW (ref 6.5–8.1)

## 2022-01-08 LAB — LEGIONELLA PNEUMOPHILA SEROGP 1 UR AG: L. pneumophila Serogp 1 Ur Ag: NEGATIVE

## 2022-01-08 LAB — CULTURE, BLOOD (ROUTINE X 2): Special Requests: ADEQUATE

## 2022-01-08 IMAGING — MR MR ANKLE*L* WO/W CM
8 series · 40 of 40 positions shown · IV contrast (8ML GADAVIST)
Comparison: None.

CLINICAL DATA: Septic arthritis suspected, ankle x-ray done.

EXAM:
MRI OF THE LEFT ANKLE WITHOUT AND WITH CONTRAST
TECHNIQUE: Multiplanar, multisequence MR imaging of the ankle was performed
before and after the administration of intravenous contrast.
CONTRAST:  8mL GADAVIST GADOBUTROL 1 MMOL/ML IV SOLN

[Series 3: T1 · axial · left · 3.0mm · 0.62mm/px · z∈[-72,+45]mm · 5 of 35 slices shown (1 of 2)]
[im 1/35]
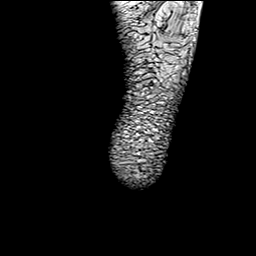
[im 9/35]
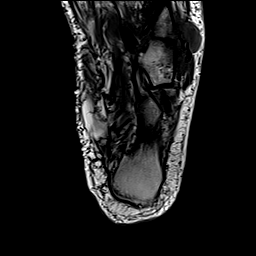
[im 18/35]
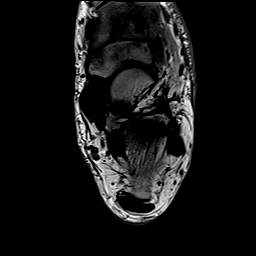
[im 26/35]
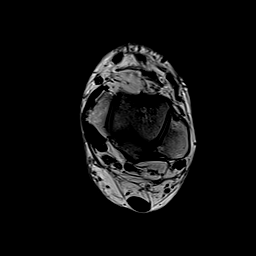
[im 35/35]
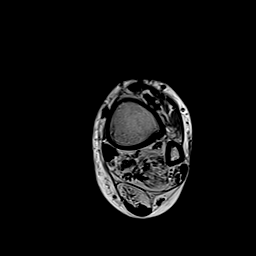

[Series 4: T2 fat-sat · axial · left · 3.0mm · 0.50mm/px · z∈[-72,+45]mm · 5 of 35 slices shown (1 of 2)]
[im 1/35]
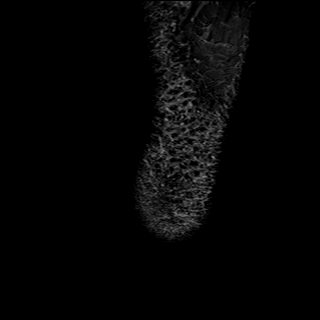
[im 9/35]
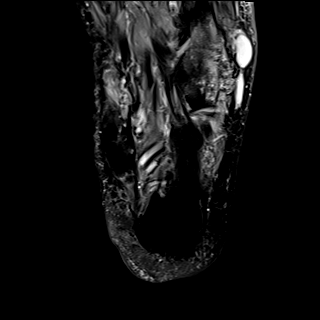
[im 18/35]
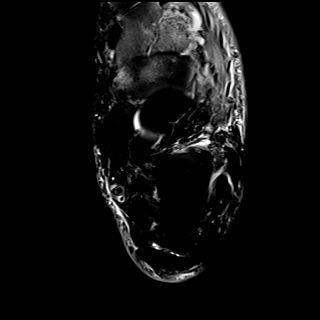
[im 26/35]
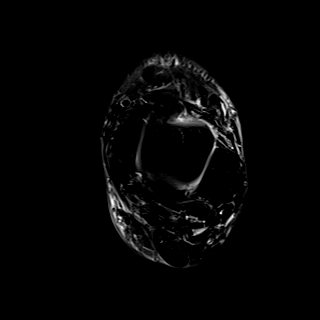
[im 35/35]
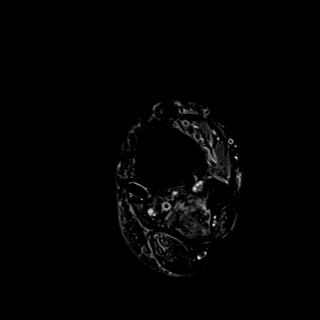

[Series 5: T2 fat-sat · coronal · left · 3.0mm · 0.70mm/px · 6 of 40 slices shown (2 of 2)]
[im 1/40]
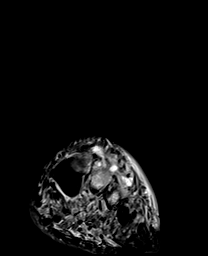
[im 8/40]
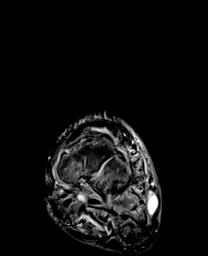
[im 16/40]
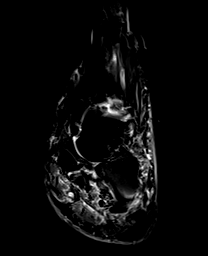
[im 24/40]
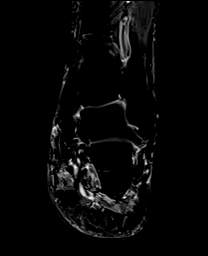
[im 32/40]
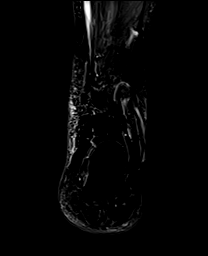
[im 40/40]
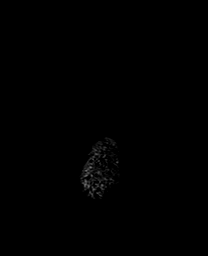

[Series 6: T1 · coronal · left · 3.0mm · 0.70mm/px · 6 of 40 slices shown (2 of 2)]
[im 1/40]
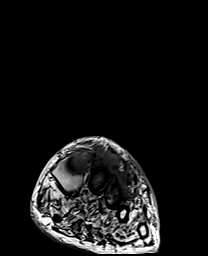
[im 8/40]
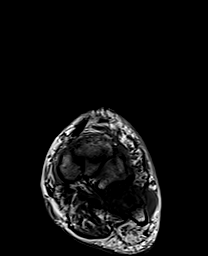
[im 16/40]
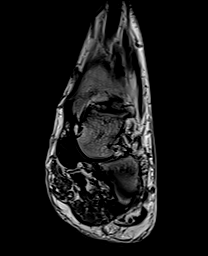
[im 24/40]
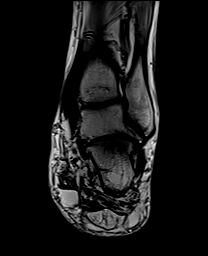
[im 32/40]
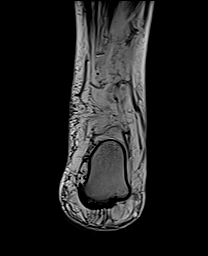
[im 40/40]
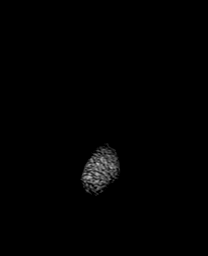

[Series 7: STIR · sagittal · left · 3.0mm · 0.35mm/px · 4 of 25 slices shown]
[im 1/25]
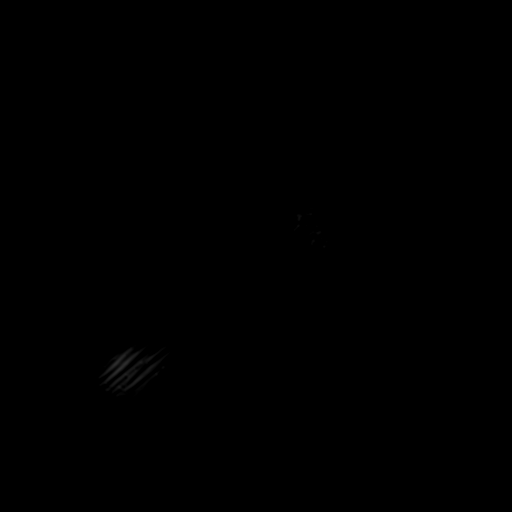
[im 9/25]
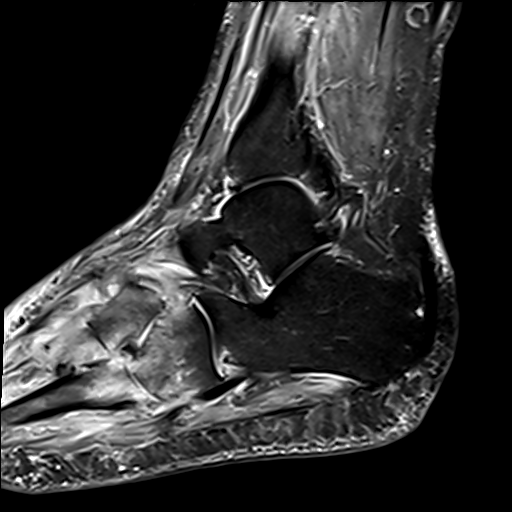
[im 17/25]
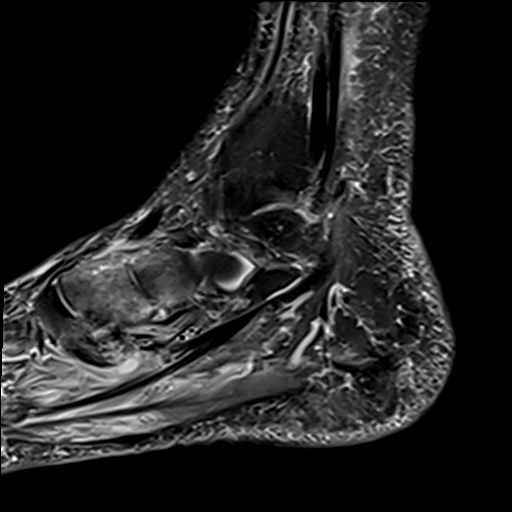
[im 25/25]
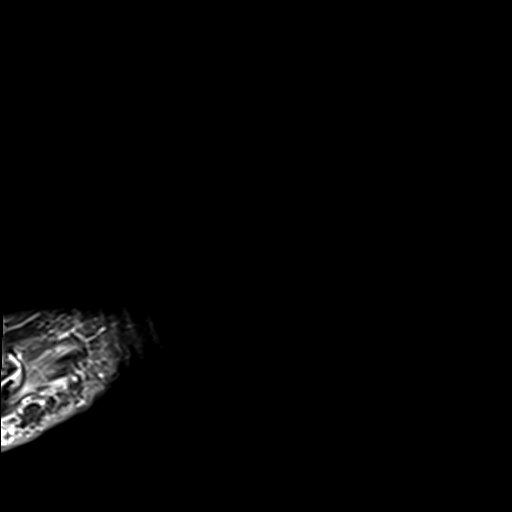

[Series 8: T1 fat-sat · axial · non-contrast · left · 3.0mm · 0.62mm/px · z∈[-72,+45]mm · 5 of 35 slices shown]
[im 1/35]
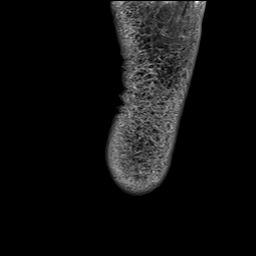
[im 9/35]
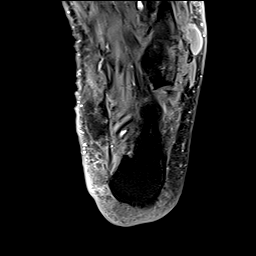
[im 18/35]
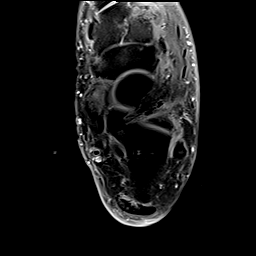
[im 26/35]
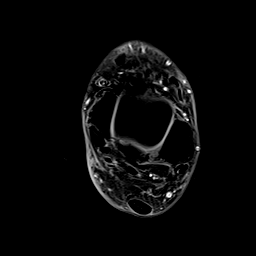
[im 35/35]
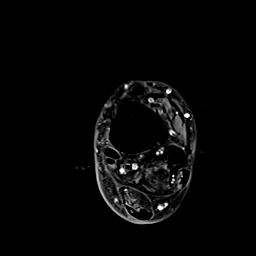

[Series 9: T1 fat-sat post-contrast · axial · left · 3.0mm · 0.62mm/px · z∈[-72,+45]mm · 5 of 35 slices shown (1 of 2)]
[im 1/35]
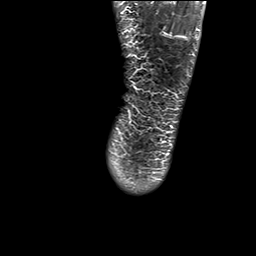
[im 9/35]
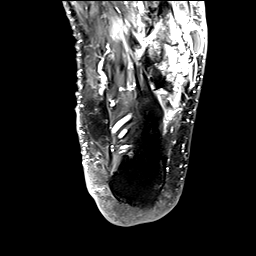
[im 18/35]
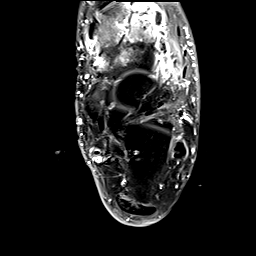
[im 26/35]
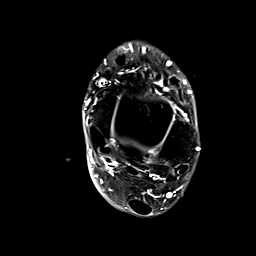
[im 35/35]
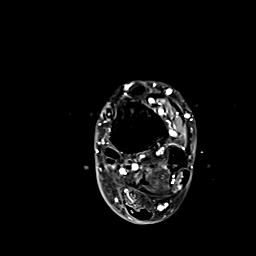

[Series 10: T1 fat-sat post-contrast · sagittal · left · 3.0mm · 0.35mm/px · 4 of 25 slices shown (2 of 2)]
[im 1/25]
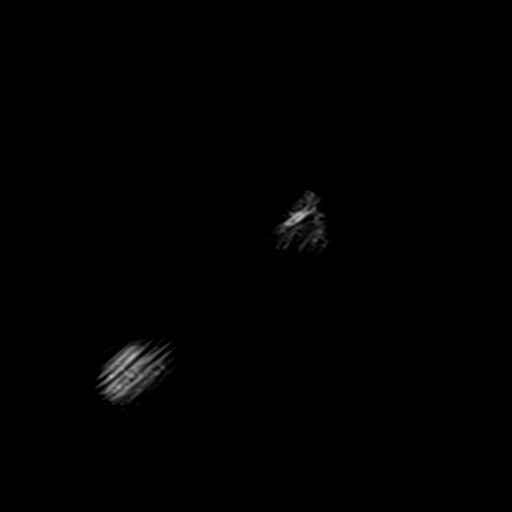
[im 9/25]
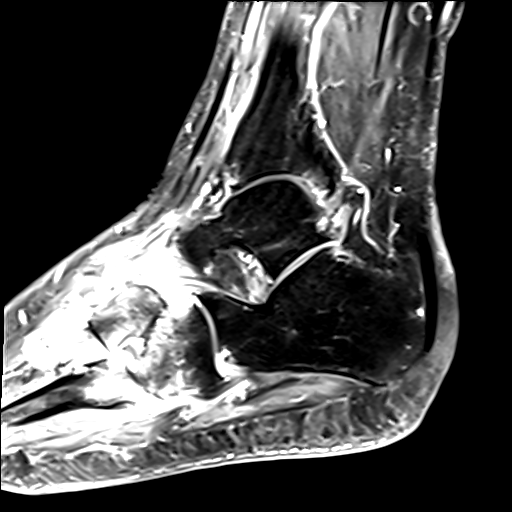
[im 17/25]
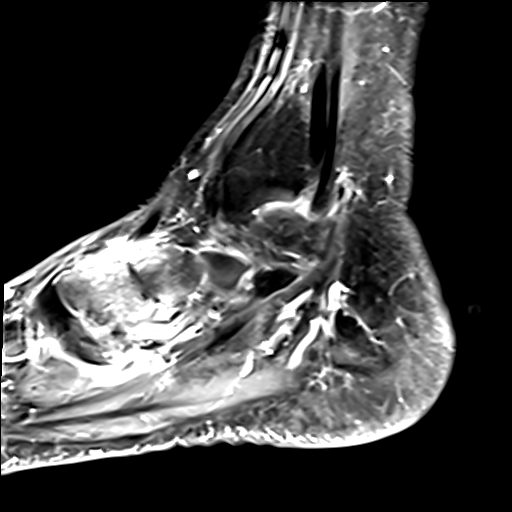
[im 25/25]
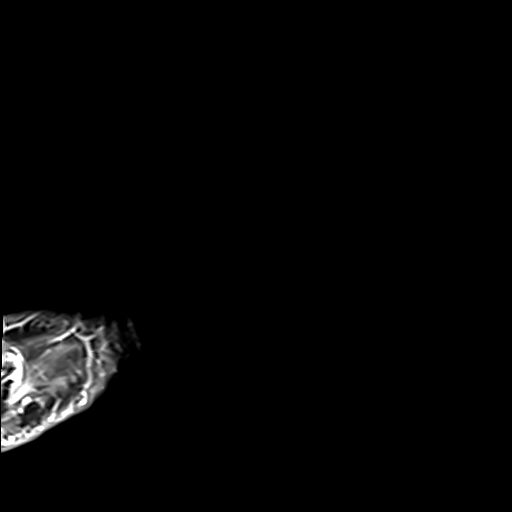

[40 of 40 positions shown; findings below may reference images not displayed]

FINDINGS: TENDONS

Peroneal: Peroneal longus tendon intact. Peroneal brevis intact.

Posteromedial: Thickening of the posterior tibial tendon with
intermediate signal concerning for tendinopathy without discrete
tear. Flexor hallucis longus tendon intact. Flexor digitorum longus
tendon intact.

Anterior: Tibialis anterior tendon intact. Extensor hallucis longus
tendon intact Extensor digitorum longus tendon intact.

Achilles: Fusiform thickening of the Achilles tendon with signal
abnormality at the insertion concerning for low-grade
partial-thickness tear.

Plantar Fascia: Intact.

LIGAMENTS

Lateral: Anterior talofibular ligament intact. Calcaneofibular
ligament intact. Posterior talofibular ligament intact. Anterior and
posterior tibiofibular ligaments intact.

Medial: Deltoid ligament intact. Spring ligament intact. Stridor

CARTILAGE

Ankle Joint: No joint effusion. Normal ankle mortise. No chondral
defect.

Subtalar Joints/Sinus Tarsi: Normal subtalar joints. No subtalar
joint effusion. Normal sinus tarsi.

Bones: There is bone marrow edema of the navicular bone medial
intermediate and lateral cuneiforms as well as base of the fifth,
fourth, third and second metatarsals with diffuse enhancement on
post-contrast sequences. Distal tibia, fibula, talus and calcaneus
are normal in signal.

Soft Tissue: Small fluid collection about the lateral aspect of the
cuboid measuring approximately 1.6 x 0.7 cm. Increased intramuscular
signal of the plantar muscles concerning for myositis/myopathy. No
enhancing intramuscular collection or abscess.
IMPRESSION: IMPRESSION
1. Bone marrow edema of the navicular, all cuneiform bones as well
as of the base of the [QY] metatarsals. In the settings of
diabetic neuropathy it could be secondary to Charcot arthropathy.
However if there is clinical concern for infectious/inflammatory
process osteomyelitis/septic arthritis is in the differential.
Clinical correlation is suggested.

2. Small fluid collection about the lateral aspect of the base of
the fifth metatarsal measuring approximately 1.6 x 0.7 cm.

3. Tendinopathy with partial-thickness insertional tear of the
Achilles tendon.

4. Tendinopathy of the tibialis posterior without evidence of
discrete tear.

## 2022-01-08 MED ORDER — GADOBUTROL 1 MMOL/ML IV SOLN
8.0000 mL | Freq: Once | INTRAVENOUS | Status: AC | PRN
  Administered 2022-01-08: 8 mL via INTRAVENOUS

## 2022-01-08 MED ORDER — LIDOCAINE 5 % EX PTCH
1.0000 | MEDICATED_PATCH | CUTANEOUS | Status: AC
  Administered 2022-01-08 – 2022-01-14 (×7): 1 via TRANSDERMAL
  Filled 2022-01-08 (×7): qty 1

## 2022-01-08 MED ORDER — OXYCODONE HCL 5 MG PO TABS
10.0000 mg | ORAL_TABLET | ORAL | Status: AC | PRN
  Administered 2022-01-08 – 2022-01-10 (×10): 10 mg via ORAL
  Filled 2022-01-08 (×10): qty 2

## 2022-01-08 NOTE — Evaluation (Signed)
Occupational Therapy Evaluation ?Patient Details ?Name: Vincent White. ?MRN: [id number] ?DOB: 1950-11-16 ?Today's Date: 01/08/2022 ? ? ?History of Present Illness Patient is 71 y.o. male s/p Rt second toe amputation on 01/06/22 due to osteomyelitis. He had presented for weakness, fatigue, and difficulty ambulating due to knee pain from gout flare up and Rt toe wound. PMH significant for gout, HLD, HTN, OSA, cervical spondylosis.  ? ?Clinical Impression ?  ?Patient is a 71 year old male who was noted to have had a significant decline in ability to participate in ADLs. Patient [place] with wife and multiple dogs. Patients increased pain in BUE and BLE with all movements, decreased functional activity tolerance,decreased endurance, decreased sitting balance, decreased safety awareness and increased dizziness with movement impacting participation in ADLs.  Patient would continue to benefit from skilled OT services at this time while admitted and after d/c to address noted deficits in order to improve overall safety and independence in ADLs.  ? ?   ? ?Recommendations for follow up therapy are one component of a multi-disciplinary discharge planning process, led by the attending physician.  Recommendations may be updated based on patient status, additional functional criteria and insurance authorization.  ? ?Follow Up Recommendations ? Skilled nursing-short term rehab (<3 hours/day)  ?  ?Assistance Recommended at Discharge Frequent or constant Supervision/Assistance  ?Patient can return home with the following Two people to help with walking and/or transfers;Two people to help with bathing/dressing/bathroom;Direct supervision/assist for medications management;Help with stairs or ramp for entrance;Assist for transportation;Direct supervision/assist for financial management;Assistance with cooking/housework ? ?  ?Functional Status Assessment ? Patient has had a recent decline in their functional status and demonstrates the  ability to make significant improvements in function in a reasonable and predictable amount of time.  ?Equipment Recommendations ? BSC/3in1  ?  ?Recommendations for Other Services   ? ? ?  ?Precautions / Restrictions Precautions ?Precautions: Fall ?Restrictions ?Weight Bearing Restrictions: No ?RLE Weight Bearing: Weight bearing as tolerated ?Other Position/Activity Restrictions: with post op shoe (not in room at time of eval)  ? ?  ? ?Mobility Bed Mobility ?Overal bed mobility: Needs Assistance ?Bed Mobility: Supine to Sit, Sit to Supine ?  ?  ?Supine to sit: Max assist, HOB elevated, +2 for safety/equipment, +2 for physical assistance ?Sit to supine: Max assist, +2 for physical assistance, +2 for safety/equipment ?  ?General bed mobility comments: VC's for sequencing hand placement on bed rail and Max+2 assist to initiated bringing LE's off EOB and reaching tto rail to roll. Max Assist+2 with bed pad to pivot hips and scoot to place feet on floor. Pt steady for ~1-2 minutes to hold balance at EOB, then Mod-Max assist to provide support to maintain balance as pt fatigued. Max-Total Assist +2 to return to supine and pt repositioned to chair position in bed (head not fully up for comfort). ?  ? ?Transfers ?  ?  ?  ?  ?  ?  ?  ?  ?  ?  ?  ? ?  ?Balance Overall balance assessment: Needs assistance ?Sitting-balance support: Feet supported, Bilateral upper extremity supported ?Sitting balance-Leahy Scale: Poor ?  ?  ?  ?  ?  ?  ?  ?  ?  ?  ?  ?  ?  ?  ?  ?  ?   ? ?ADL either performed or assessed with clinical judgement  ? ?ADL Overall ADL's : Needs assistance/impaired ?Eating/Feeding: Minimal assistance;Bed level ?Eating/Feeding Details (indicate cue type  and reason): current ROM deficits and pain levels might need follow up assist ?Grooming: Wash/dry face;Bed level;Set up ?Grooming Details (indicate cue type and reason): with set up ?Upper Body Bathing: Bed level;Maximal assistance ?  ?Lower Body Bathing: Bed  level;Total assistance ?  ?Upper Body Dressing : Bed level;Maximal assistance ?  ?Lower Body Dressing: Total assistance;Bed level ?  ?Toilet Transfer: +2 for safety/equipment;+2 for physical assistance ?Toilet Transfer Details (indicate cue type and reason): patient unable to tolerate sitting on edge of bed with increased dizziness and pain with all movement ?Toileting- Clothing Manipulation and Hygiene: Total assistance;Bed level ?  ?  ?  ?Functional mobility during ADLs: +2 for physical assistance;+2 for safety/equipment ?   ? ? ? ?Vision Patient Visual Report: No change from baseline ?   ?   ?Perception   ?  ?Praxis   ?  ? ?Pertinent Vitals/Pain Pain Assessment ?Pain Assessment: Faces ?Faces Pain Scale: Hurts whole lot ?Pain Location: genrealized pain in back, neck, LE joints ?Pain Descriptors / Indicators: Aching, Discomfort, Grimacing, Guarding ?Pain Intervention(s): Limited activity within patient's tolerance, Monitored during session, Repositioned  ? ? ? ?Hand Dominance Right ?  ?Extremity/Trunk Assessment Upper Extremity Assessment ?Upper Extremity Assessment: RUE deficits/detail;LUE deficits/detail ?RUE Deficits / Details: patient noted to have pain with all movements of UE. patient noted to have slight edema in this UE. patient able to ROM AFL at end of session MMT not tested due to pain. ?LUE Deficits / Details: patient noted to have increased edema in this UE with pain in movement of shoulders. AROM limited due to pain levels. MMT not tested due to pain ?  ?Lower Extremity Assessment ?Lower Extremity Assessment: Defer to PT evaluation ?  ?Cervical / Trunk Assessment ?Cervical / Trunk Assessment: Normal ?  ?Communication Communication ?Communication: No difficulties ?  ?Cognition Arousal/Alertness: Awake/alert ?Behavior During Therapy: [facility] for tasks assessed/performed ?Overall Cognitive Status: Within Functional Limits for tasks assessed ?  ?  ?  ?  ?  ?  ?  ?  ?  ?  ?  ?  ?  ?  ?  ?  ?  ?  ?  ?General  Comments    ? ?  ?Exercises   ?  ?Shoulder Instructions    ? ? ?Home Living Family/patient expects to be discharged to:: Private residence ?Living Arrangements: Spouse/significant other ?Available Help at Discharge: Family ?Type of Home: House ?Home Access: Stairs to enter ?Entrance Stairs-Number of Steps: 5-6 ?Entrance Stairs-Rails: Right ?Home Layout: One level ?  ?  ?Bathroom Shower/Tub: Walk-in shower ?  ?Bathroom Toilet: Handicapped height ?Bathroom Accessibility: Yes ?  ?Home Equipment: Crutches;Rolling Walker (2 wheels);Grab bars - tub/shower ?  ?Additional Comments: pt lives with his wife and 3 dogs. he is retired from [employer] but his wife is still working. ?  ? ?  ?Prior Functioning/Environment Prior Level of Function : Independent/Modified Independent ?  ?  ?  ?  ?  ?  ?  ?  ?  ? ?  ?  ?OT Problem List: Decreased activity tolerance;Impaired balance (sitting and/or standing);Decreased safety awareness;Pain;Impaired UE functional use;Decreased knowledge of precautions;Decreased knowledge of use of DME or AE;Decreased coordination;Decreased strength;Decreased range of motion ?  ?   ?OT Treatment/Interventions: Self-care/ADL training;Therapeutic exercise;Energy conservation;Neuromuscular education;DME and/or AE instruction;Therapeutic activities;Balance training;Patient/family education  ?  ?OT Goals(Current goals can be found in the care plan section) Acute Rehab OT Goals ?Patient Stated Goal: to get back home ?OT Goal Formulation: With patient ?Time For Goal  Achievement:  ?Potential to Achieve Goals: Fair  ?OT Frequency: Min 2X/week ?  ? ?Co-evaluation PT/OT/SLP Co-Evaluation/Treatment: Yes ?Reason for Co-Treatment: For patient/therapist safety ?PT goals addressed during session: Mobility/safety with mobility ?OT goals addressed during session: ADL's and self-care ?  ? ?  ?AM-PAC OT "6 Clicks" Daily Activity     ?Outcome Measure Help from another person eating meals?: A Little ?Help from another  person taking care of personal grooming?: A Little ?Help from another person toileting, which includes using toliet, bedpan, or urinal?: Total ?Help from another person bathing (including washing, rinsing, dryi

## 2022-01-08 NOTE — Progress Notes (Signed)
?  Subjective:  ?Patient ID: Vincent White., male    DOB: 09/17/1951,  MRN: [id number] ? ?Overall doing okay he is in quite a bit of pain but the pain in the toe is gone ?Negative for chest pain and shortness of breath ?Fever: no ?Night sweats: no ?Objective:  ? ?Vitals:  ? 01/08/22 0700 01/08/22 0800  ?BP: (!) 167/77 (!) 173/79  ?Pulse: 83 86  ?Resp: 18 19  ?Temp:  98.1 ?F (36.7 ?C)  ?SpO2: 95% 96%  ? ?General AA&O x3. Normal mood and affect.  ?Vascular Dorsalis pedis and posterior tibial pulses 2/4 bilat. ?Brisk capillary refill to all digits. Pedal hair present.  ?Neurologic Epicritic sensation grossly reduced.  ?Dermatologic Cellulitis and rash is improving seems to be more coalesced at this point.  Dressing dry and intact  ?Orthopedic: MMT 5/5 in dorsiflexion, plantarflexion, inversion, and eversion. ?Normal joint ROM without pain or crepitus.  ? ? ?Assessment & Plan:  ?Patient was evaluated and treated and all questions answered. ? ?POD #2 status post right second toe amputation and osteomyelitis ?-Doing well no dressing changes needed.  WBAT in a postop shoe.  Continue antibiotics per ID.  We will continue to monitor his progress.  We will have my office coordinate outpatient follow-up. ? ?Criselda Peaches, DPM ? ?Accessible via secure chat for questions or concerns. ? ?

## 2022-01-08 NOTE — Progress Notes (Signed)
? ? ? ? ? ? ?Subjective: ?Left ankle still hurting where he thinks he has gout ? ? ?Antibiotics:  ?Anti-infectives (From admission, onward)  ? ? Start     Dose/Rate Route Frequency Ordered Stop  ? 01/05/22 1400  azithromycin (ZITHROMAX) 500 mg in sodium chloride 0.9 % 250 mL IVPB  Status:  Discontinued       ? 500 mg ?250 mL/hr over 60 Minutes Intravenous Every 24 hours 01/04/22 1257 01/05/22 0851  ? 01/05/22 1200  cefTRIAXone (ROCEPHIN) 1 g in sodium chloride 0.9 % 100 mL IVPB  Status:  Discontinued       ? 1 g ?200 mL/hr over 30 Minutes Intravenous Every 24 hours 01/04/22 1257 01/05/22 0226  ? 01/05/22 0400  ceFAZolin (ANCEF) IVPB 2g/100 mL premix       ? 2 g ?200 mL/hr over 30 Minutes Intravenous Every 8 hours 01/05/22 0226    ? 01/04/22 1215  cefTRIAXone (ROCEPHIN) 2 g in sodium chloride 0.9 % 100 mL IVPB       ? 2 g ?200 mL/hr over 30 Minutes Intravenous  Once 01/04/22 1208 01/04/22 1338  ? 01/04/22 1215  azithromycin (ZITHROMAX) 500 mg in sodium chloride 0.9 % 250 mL IVPB       ? 500 mg ?250 mL/hr over 60 Minutes Intravenous  Once 01/04/22 1208 01/04/22 1442  ? ?  ? ? ?Medications: ?Scheduled Meds: ? amLODipine  10 mg Oral Daily  ? Chlorhexidine Gluconate Cloth  6 each Topical Daily  ? colchicine  0.3 mg Oral BID  ? And  ? probenecid  500 mg Oral BID  ? folic acid  1 mg Oral Daily  ? insulin aspart  0-15 Units Subcutaneous TID WC  ? lidocaine  1 patch Transdermal Q24H  ? LORazepam  0-4 mg Intravenous Q8H  ? multivitamin with minerals  1 tablet Oral Daily  ? thiamine  100 mg Oral Daily  ? Or  ? thiamine  100 mg Intravenous Daily  ? ?Continuous Infusions: ?  ceFAZolin (ANCEF) IV Stopped (01/08/22 1246)  ? ?PRN Meds:.acetaminophen **OR** acetaminophen, albuterol, gadobutrol, ipratropium, methocarbamol, ondansetron **OR** ondansetron (ZOFRAN) IV, oxyCODONE ? ? ? ?Objective: ?Weight change:  ? ?Intake/Output Summary (Last 24 hours) at 01/08/2022 1512 ?Last data filed at 01/08/2022 1247 ?Gross per 24 hour  ?Intake  940 ml  ?Output 2575 ml  ?Net -1635 ml  ? ?Blood pressure 139/74, pulse 98, temperature 98 ?F (36.7 ?C), temperature source Oral, resp. rate (!) 24, height '5\' 6"'$  (1.676 m), weight 86.4 kg, SpO2 93 %. ?Temp:  [97.4 ?F (36.3 ?C)-98.1 ?F (36.7 ?C)] 98 ?F (36.7 ?C) (04/10 1100) ?Pulse Rate:  [69-98] 98 (04/10 1400) ?Resp:  [16-24] 24 (04/10 1400) ?BP: (139-183)/(59-89) 139/74 (04/10 1400) ?SpO2:  [90 %-97 %] 93 % (04/10 1400) ? ?Physical Exam: ?Physical Exam ?Constitutional:   ?   Appearance: He is well-developed. He is ill-appearing.  ?HENT:  ?   Head: Normocephalic and atraumatic.  ?Eyes:  ?   Conjunctiva/sclera: Conjunctivae normal.  ?Cardiovascular:  ?   Rate and Rhythm: Normal rate and regular rhythm.  ?Pulmonary:  ?   Effort: Pulmonary effort is normal. No respiratory distress.  ?   Breath sounds: No wheezing.  ?Abdominal:  ?   General: There is no distension.  ?   Palpations: Abdomen is soft.  ?Musculoskeletal:  ?   Cervical back: Normal range of motion and neck supple.  ?   Left ankle: Tenderness present. Decreased range of motion.  ?  Comments: Right foot bandaged  ?Skin: ?   General: Skin is warm and dry.  ?   Coloration: Skin is pale.  ?   Findings: No erythema.  ?Neurological:  ?   General: No focal deficit present.  ?   Mental Status: He is alert and oriented to person, place, and time.  ?Psychiatric:     ?   Mood and Affect: Mood normal.     ?   Behavior: Behavior normal.     ?   Thought Content: Thought content normal.     ?   Judgment: Judgment normal.  ?  ? ?CBC: ? ? ? ?BMET ?Recent Labs  ?  01/07/22 ?0240 01/07/22 ?1513 01/08/22 ?0244  ?NA 132* 132* 134*  ?K 4.0  --  3.6  ?CL 99  --  103  ?CO2 21*  --  23  ?GLUCOSE 137*  --  115*  ?BUN 56*  --  50*  ?CREATININE 1.50*  --  0.94  ?CALCIUM 8.1*  --  8.5*  ? ? ? ?Liver Panel ? ?Recent Labs  ?  01/07/22 ?0240 01/08/22 ?0244  ?PROT 5.8* 6.0*  ?ALBUMIN 2.4* 3.0*  ?AST 46* 50*  ?ALT 22 18  ?ALKPHOS 139* 100  ?BILITOT 1.0 1.0  ? ? ? ? ? ?Sedimentation Rate ?No  results for input(s): ESRSEDRATE in the last 72 hours. ?C-Reactive Protein ?No results for input(s): CRP in the last 72 hours. ? ?Micro Results: ?Recent Results (from the past 720 hour(s))  ?Blood Culture (routine x 2)     Status: Abnormal  ? Collection Time: 01/04/22 10:55 AM  ? Specimen: BLOOD  ?Result Value Ref Range Status  ? Specimen Description   Final  ?  BLOOD LEFT ANTECUBITAL ?Performed at [facility], [place] [address] [place], [place] [address] ?  ? Special Requests   Final  ?  BOTTLES DRAWN AEROBIC AND ANAEROBIC Blood Culture adequate volume ?Performed at [facility], [place] [address] [place], [place] [address] ?  ? Culture  Setup Time   Final  ?  GRAM POSITIVE COCCI ?IN BOTH AEROBIC AND ANAEROBIC BOTTLES ?CRITICAL VALUE NOTED.  VALUE IS CONSISTENT WITH PREVIOUSLY REPORTED AND CALLED VALUE. ?  ? Culture (A)  Final  ?  STAPHYLOCOCCUS AUREUS ?SUSCEPTIBILITIES PERFORMED ON PREVIOUS CULTURE WITHIN THE LAST 5 DAYS. ?Performed at [facility], [place] [address] [place], [place] [address] ?  ? Report Status 01/07/2022 FINAL  Final  ?Blood Culture (routine x 2)     Status: Abnormal  ? Collection Time: 01/04/22 10:56 AM  ? Specimen: BLOOD  ?Result Value Ref Range Status  ? Specimen Description   Final  ?  BLOOD RIGHT ANTECUBITAL ?Performed at [facility], [place] [address], [place], [place] [address] ?  ? Special Requests   Final  ?  BOTTLES DRAWN AEROBIC AND ANAEROBIC Blood Culture results may not be optimal due to an excessive volume of blood received in culture bottles ?Performed at [facility], [place] [address], [place], [place] [address] ?  ? Culture  Setup Time   Final  ?  GRAM POSITIVE COCCI ?AEROBIC BOTTLE ONLY ?IN BOTH AEROBIC AND ANAEROBIC BOTTLES ?CRITICAL RESULT CALLED TO, READ BACK BY AND VERIFIED WITH: ?PHARMD ELLEN JACKSON 01/05/22'@2'$ :14 BY TW ?Performed at [facility], [place] [address] [place], [place] [address] ?  ?  Culture STAPHYLOCOCCUS AUREUS (A)  Final  ? Report Status 01/07/2022 FINAL  Final  ?  Organism ID, Bacteria STAPHYLOCOCCUS AUREUS  Final  ?    Susceptibility  ? Staphylococcus aureus - MIC*  ?  CIPROFLOXACIN <=0.5 SENSITIVE Sensitive   ?  ERYTHROMYCIN <=0.25 SENSITIVE Sensitive   ?  GENTAMICIN <=0.5 SENSITIVE Sensitive   ?  OXACILLIN 0.5 SENSITIVE Sensitive   ?  TETRACYCLINE <=1 SENSITIVE Sensitive   ?  VANCOMYCIN <=0.5 SENSITIVE Sensitive   ?  TRIMETH/SULFA <=10 SENSITIVE Sensitive   ?  CLINDAMYCIN <=0.25 SENSITIVE Sensitive   ?  RIFAMPIN <=0.5 SENSITIVE Sensitive   ?  Inducible Clindamycin NEGATIVE Sensitive   ?  * STAPHYLOCOCCUS AUREUS  ?Blood Culture ID Panel (Reflexed)     Status: Abnormal  ? Collection Time: 01/04/22 10:56 AM  ?Result Value Ref Range Status  ? Enterococcus faecalis NOT DETECTED NOT DETECTED Final  ? Enterococcus Faecium NOT DETECTED NOT DETECTED Final  ? Listeria monocytogenes NOT DETECTED NOT DETECTED Final  ? Staphylococcus species DETECTED (A) NOT DETECTED Final  ?  Comment: CRITICAL RESULT CALLED TO, READ BACK BY AND VERIFIED WITH: ?PHARMD ELLEN JACKSON 01/05/22'@2'$ :14 BY TW ?  ? Staphylococcus aureus (BCID) DETECTED (A) NOT DETECTED Final  ?  Comment: CRITICAL RESULT CALLED TO, READ BACK BY AND VERIFIED WITH: ?PHARMD ELLEN JACKSON 01/05/22'@2'$ :14 BY TW ?  ? Staphylococcus epidermidis NOT DETECTED NOT DETECTED Final  ? Staphylococcus lugdunensis NOT DETECTED NOT DETECTED Final  ? Streptococcus species NOT DETECTED NOT DETECTED Final  ? Streptococcus agalactiae NOT DETECTED NOT DETECTED Final  ? Streptococcus pneumoniae NOT DETECTED NOT DETECTED Final  ? Streptococcus pyogenes NOT DETECTED NOT DETECTED Final  ? A.calcoaceticus-baumannii NOT DETECTED NOT DETECTED Final  ? Bacteroides fragilis NOT DETECTED NOT DETECTED Final  ? Enterobacterales NOT DETECTED NOT DETECTED Final  ? Enterobacter cloacae complex NOT DETECTED NOT DETECTED Final  ? Escherichia coli NOT DETECTED NOT DETECTED Final  ?  Klebsiella aerogenes NOT DETECTED NOT DETECTED Final  ? Klebsiella oxytoca NOT DETECTED NOT DETECTED Final  ? Klebsiella pneumoniae NOT DETECTED NOT DETECTED Final  ? Proteus species NOT DETECTED NOT DETECTED Fi

## 2022-01-08 NOTE — Progress Notes (Signed)
?PROGRESS NOTE ? ? ? ?Vincent White.  KVQ:[id number] DOB: 1951/06/13 DOA: 01/04/2022 ?PCP: Leighton Ruff, MD (Inactive)  ? ? ?Brief Narrative:  ?Vincent White. is a 71 year old male with past medical history significant for essential pretension, hyperlipidemia, cervical spondylosis, gout, vitamin D deficiency, mitral valve insufficiency, pulmonary HTN, type 2 diabetes mellitus, OSA on CPAP who presented to [facility] ED on 4/6 via EMS with generalized weakness, fatigue, poor oral intake and inability to ambulate over the last 2-3 weeks.  Patient also complaining of increased knee pain thought to be secondary to an acute gout flare in which he was drinking "tart cherry juice"; as he heard this may help with gout flares.  Patient has not been eating or drinking much over the last week as well other than hard apple cider 6-12 cans daily.  Also reports right second toe wound following cutting his toenails.  He denies fever, no chills, no sore throat, no shortness of breath, no cough, no chest pain, no palpitations, no diaphoresis, no urinary symptoms, no visual changes. ? ?In the ED, temperature 99.4, HR 108, RR 31, BP 124/78, SPO2 94% on room air.  Sodium 118, potassium 4.8, chloride 81, CO2 21, glucose 190, BUN 63, creatinine 2.20, alkaline phosphatase 127, AST 67, ALT 70, total bilirubin 1.2.  Lactic acid 3.2.  WBC 31.0, hemoglobin 14.3, platelets 541.  COVID-19 PCR negative.  Influenza A/B PCR negative.  Urinalysis with large hemoglobin, trace leukocytes, negative nitrite, rare bacteria, greater than 11-20 WBCs.  Chest x-ray with hypoventilation, elevated right hemidiaphragm with right lower lobe airspace disease consistent with atelectasis.  Patient was given acetaminophen 1 g p.o. x1, azithromycin 5 mg IV, ceftriaxone 1 g IV, oxycodone 5 mg p.o. and 1 L NS bolus.  Hospital service consulted for further evaluation and management of sepsis. ? ?01/06/2022: Patient seen.  Available records reviewed.  Patient has undergone  amputation of the right second toe.  Likely, patient has previously undocumented liver cirrhosis.  We will pursue an abdominal ultrasound.  In the setting of possible acute kidney injury, will also start patient on midodrine and IV albumin.  We discontinued IV fluids.  Infectious disease input is appreciated.  We will check acute hepatitis panel and cryo.  Patient seems volume overloaded.  Will discontinue IV fluid.  Overall, patient is very sick with guarded prognosis. ? ?01/07/2022: Patient seen alongside patient's wife.  Patient looks better today.  Patient looks less edematous today.  We will continue IV albumin.  Abdominal ultrasound revealed well distended gallbladder with gallbladder sludge.  No cholelithiasis was reported.  Patient is documented to have cirrhosis.  Blood and urine cultures have grown MSSA.  Communicated with infectious disease physician, Dr.Comer, patient will need TEE.  TTE is nonrevealing.  Normal EF.  Diastolic function could not be determined. ? ?01/08/2022: Patient seen alongside patient's months.  Patient continues to look better.  Patient reports back pain.  We used lidocaine patch and, increased oxycodone from 5 Mg p.o. every 4 hourly to 10 Mg p.o. every 4 hourly as needed.  Acute kidney injury has resolved.  Cardiology team has been consulted for TEE.  Continue antibiotics.  Edema has improved. ? ? ?Assessment & Plan: ? ?Severe sepsis, POA ?MSSA septicemia, POA ?Right second toe diabetic wound/osteomyelitis ?Patient presenting to the ED with progressive weakness, fatigue, decreased ambulation and poor oral intake.  Patient was febrile up to 101.9, tachycardic, tachypneic with elevated lactic acid of 3.2 on admission.  WBC count elevated 31.0.  Chest  x-ray with right lower lobe airspace disease consistent with atelectasis versus pneumonia.  Right second toe x-ray with skin and soft tissue irregularity around the distal digit, indistinct cortex of the distal tuft of the second distal  phalanx suspicious for osteomyelitis. ?--Infectious disease and podiatry consulted ?--WBC 31.0>26.7 ?--Blood cultures x2 4/6: BCID + MSSA; awaiting further identification/susceptibilities ?--TTE: Pending ?--Cefazolin 2 g IV every 8 hours ?--CBC daily. ?--Supportive care, pain control, IV fluid hydration ?01/06/2022: Infectious disease and podiatry input is appreciated.  Patient has undergone amputation of the right second toe.  The rash on the right lower leg may be related to the patient's undiagnosed likely liver disease/cirrhosis.  Viral hepatitis status is unknown.  We will check acute hepatitis panel.  Check cryoglobulin level.  Right upper quadrant ultrasound to confirm if patient has cirrhosis.  Cirrhosis could be secondary to the alcohol on NASH or combined effect of both.  Guarded prognosis.  Antibiotics as per infectious disease team. ?01/07/2022: See above documentation.  Blood and urine cultures have grown MSSA.  Patient will need TEE.  We will consult cardiology team.  Patient is currently on IV cefazolin.  Infectious diseases directing care. ?01/08/2022: Continue antibiotics.  Leukocytosis is improving.  Overall, patient looks better.  Sepsis physiology has resolved.  Will discontinue midodrine. ? ?Hyponatremia ?Sodium 118 on admission.  Etiology likely secondary to EtOH abuse complicated by severe sepsis as above and likely dehydration from poor oral intake in the days preceding hospitalization.  Urine osmolality 316, urine sodium less than 10. ?--Na 118>>122 ?--Continue IV fluid hydration w/ NA at 156m/hr ?--Na level q12h ?-- Monitor on telemetry ?01/08/2022: Hyponatremia is likely multifactorial.  Suspect secondary to combined alcohol abuse, liver disease, ineffective circulatory arterial volume and cannot rule out SIADH.  May also have prognostic significance.  Sodium is slowly improving.  Sodium is 134 today.   ? ?Lower extremity edema ?Etiology likely secondary to acute infectious process.  Previous  TTE with LVEF 676-72% normal diastolic parameters on 60/94/7096 ?--Vascular duplex ultrasound bilateral lower extremities to rule out DVT ?--TTE pending as above ?--Lower extremity elevation ?01/08/2022: Improved significantly.  Patient has been on IV albumin.   ? ?Acute renal failure ?Creatinine 2.20 on admission.  Etiology likely secondary to prerenal azotemia in the setting of dehydration and poor oral intake in the days preceding hospitalization versus ATN from underlying severe sepsis as above with septicemia.  Additionally, patient with acute urinary retention likely contributing as well. ?--Cr 2.20>>1.81 ?--Holding home losartan/metformin ?--IV fluid hydration ?--Foley catheter ordered/7 ?--Strict I's and O's ?--Avoid nephrotoxins, renally dose all medications ?--BMP daily ?01/06/2022: Hopefully, will not delay with hepatorenal syndrome.  Start IV albumin and midodrine. ?01/07/2022: Slowly improving.  Possibly secondary to ATN/ineffective arterial circulatory volume.  Continue IV albumin and midodrine. ?01/08/2022: Acute kidney injury has resolved.  Discontinue midodrine. ? ?Hx Gout ?Patient with reported history of gout.  Takes Celebrex and Norco as needed.  Not on allopurinol outpatient.  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  Patient has been having trouble ambulating over the last 2 weeks and thought this was too due to acute gout flare that he was self treating with "tart cherry juice".  Bilateral knees with pain on active/passive range of motion, but no erythema/fluctuance and nontender to palpation.  Has been seen by orthopedics in the past, Dr. GGladstone Lighter last seen in clinic 2019. ?--Check bilateral knee x-rays ?--Check uric acid ?--Colchicine  0.3 mg BID ?--Probenecid mg PO BID ?--Oxycodone 5 mg PO q4h PRN ? ?Transaminitis ?Etiology likely secondary to acute infectious process as above.  Patient on statin  outpatient. ?--AST 105>67 ?--ALT 79>70 ?--Tbili 2.0>1.2 ?-- CMP daily ?01/06/2022: Likely previously undiagnosed liver disease.  Repeat CMP in the morning. ?01/07/2022: Patient has liver cirrhosis.  Not currently decompensat

## 2022-01-08 NOTE — Evaluation (Signed)
Physical Therapy Evaluation ?Patient Details ?Name: Vincent White. ?MRN: [id number] ?DOB: Sep 14, 1951 ?Today's Date: 01/08/2022 ? ?History of Present Illness ? Patient is 71 y.o. male s/p Rt second toe amputation on 01/06/22 due to osteomyelitis. He had presented for weakness, fatigue, and difficulty ambulating due to knee pain from gout flare up and Rt toe wound. PMH significant for gout, HLD, HTN, OSA, cervical spondylosis. ? ?  ?Clinical Impression ? Vincent Eastham. is 71 y.o. male admitted with above HPI and diagnosis. Patient is currently limited by functional impairments below (see PT problem list). Patient lives with his wife and is independent at baseline. Currently pt requires Max +2 assist for supine<>sit transfers in bed due to significant pain in back/neck/knees. Anticipate if pt's pain becomes controlled he will progress well.Patient will benefit from continued skilled PT interventions to address impairments and progress independence with mobility, recommending HHPT with constant supervision/assistance. Acute PT will follow and progress as able.    ?   ? ?Recommendations for follow up therapy are one component of a multi-disciplinary discharge planning process, led by the attending physician.  Recommendations may be updated based on patient status, additional functional criteria and insurance authorization. ? ?Follow Up Recommendations Home health PT ? ?  ?Assistance Recommended at Discharge Frequent or constant Supervision/Assistance  ?Patient can return home with the following ? Two people to help with walking and/or transfers;Two people to help with bathing/dressing/bathroom;Assistance with cooking/housework;Direct supervision/assist for medications management;Assist for transportation;Help with stairs or ramp for entrance ? ?  ?Equipment Recommendations None recommended by PT (TBA)  ?Recommendations for Other Services ?    ?  ?Functional Status Assessment Patient has had a recent decline in their  functional status and demonstrates the ability to make significant improvements in function in a reasonable and predictable amount of time.  ? ?  ?Precautions / Restrictions Precautions ?Precautions: Fall ?Restrictions ?Weight Bearing Restrictions: No ?RLE Weight Bearing: Weight bearing as tolerated  ? ?  ? ?Mobility ? Bed Mobility ?Overal bed mobility: Needs Assistance ?Bed Mobility: Supine to Sit, Sit to Supine ?  ?  ?Supine to sit: Max assist, HOB elevated, +2 for safety/equipment, +2 for physical assistance ?Sit to supine: Max assist, +2 for physical assistance, +2 for safety/equipment ?  ?General bed mobility comments: VC's for sequencing hand placement on bed rail and Max+2 assist to initiated bringing LE's off EOB and reaching tto rail to roll. Max Assist+2 with bed pad to pivot hips and scoot to place feet on floor. Pt steady for ~1-2 minutes to hold balance at EOB, then Mod-Max assist to provide support to maintain balance as pt fatigued. Max-Total Assist +2 to return to supine and pt repositioned to chair position in bed (head not fully up for comfort). ?  ? ?Transfers ?  ?  ?  ?  ?  ?  ?  ?  ?  ?General transfer comment: deferred due to dizziness, pain ?  ? ?Ambulation/Gait ?  ?  ?  ?  ?  ?  ?  ?  ? ?Stairs ?  ?  ?  ?  ?  ? ?Wheelchair Mobility ?  ? ?Modified Rankin (Stroke Patients Only) ?  ? ?  ? ?Balance   ?  ?  ?  ?  ?  ?  ?  ?  ?  ?  ?  ?  ?  ?  ?  ?  ?  ?  ?   ? ? ? ?Pertinent Vitals/Pain Pain Assessment ?  Pain Assessment: Faces ?Faces Pain Scale: Hurts whole lot ?Pain Location: genrealized pain in back, neck, LE joints ?Pain Descriptors / Indicators: Aching, Discomfort, Grimacing, Guarding ?Pain Intervention(s): Limited activity within patient's tolerance, Monitored during session, Repositioned  ? ? ?Home Living Family/patient expects to be discharged to:: Private residence ?Living Arrangements: Spouse/significant other ?Available Help at Discharge: Family ?Type of Home: House ?Home Access: Stairs  to enter ?Entrance Stairs-Rails: Right ?Entrance Stairs-Number of Steps: 5-6 ?  ?Home Layout: One level ?Home Equipment: Crutches;Rolling Walker (2 wheels);Grab bars - tub/shower ?Additional Comments: pt lives with his wife and 3 dogs. he is retired from [employer] but his wife is still working.  ?  ?Prior Function Prior Level of Function : Independent/Modified Independent ?  ?  ?  ?  ?  ?  ?  ?  ?  ? ? ?Hand Dominance  ? Dominant Hand: Right ? ?  ?Extremity/Trunk Assessment  ? Upper Extremity Assessment ?Upper Extremity Assessment: Defer to OT evaluation ?  ? ?Lower Extremity Assessment ?Lower Extremity Assessment: LLE deficits/detail;RLE deficits/detail ?RLE Deficits / Details: unable to MMT, bil knee joints resting in flexion ~30 degrees for comfort. bil knees swollen, shiny, and warm. ?RLE: Unable to fully assess due to pain ?LLE Deficits / Details: unable to MMT, bil knee joints resting in flexion ~30 degrees for comfort. bil knees swollen, shiny, and warm. ?LLE: Unable to fully assess due to pain ?  ? ?Cervical / Trunk Assessment ?Cervical / Trunk Assessment: Normal  ?Communication  ? Communication: No difficulties  ?Cognition Arousal/Alertness: Awake/alert ?Behavior During Therapy: [facility] for tasks assessed/performed ?Overall Cognitive Status: Within Functional Limits for tasks assessed ?  ?  ?  ?  ?  ?  ?  ?  ?  ?  ?  ?  ?  ?  ?  ?  ?  ?  ?  ? ?  ?General Comments   ? ?  ?Exercises    ? ?Assessment/Plan  ?  ?PT Assessment Patient needs continued PT services  ?PT Problem List Decreased strength;Decreased range of motion;Decreased activity tolerance;Decreased balance;Decreased mobility;Decreased safety awareness;Decreased knowledge of precautions;Pain ? ?   ?  ?PT Treatment Interventions DME instruction;Gait training;Stair training;Functional mobility training;Therapeutic activities;Therapeutic exercise;Balance training;Neuromuscular re-education;Patient/family education   ? ?PT Goals (Current goals can be found  in the Care Plan section)  ?Acute Rehab PT Goals ?Patient Stated Goal: to get back independence with moving and return home ?PT Goal Formulation: With patient ?Time For Goal Achievement:  ?Potential to Achieve Goals: Fair ? ?  ?Frequency Min 3X/week ?  ? ? ?Co-evaluation   ?  ?  ?  ?  ? ? ?  ?AM-PAC PT "6 Clicks" Mobility  ?Outcome Measure Help needed turning from your back to your side while in a flat bed without using bedrails?: A Lot ?Help needed moving from lying on your back to sitting on the side of a flat bed without using bedrails?: Total ?Help needed moving to and from a bed to a chair (including a wheelchair)?: Total ?Help needed standing up from a chair using your arms (e.g., wheelchair or bedside chair)?: Total ?Help needed to walk in hospital room?: Total ?Help needed climbing 3-5 steps with a railing? : Total ?6 Click Score: 7 ? ?  ?End of Session   ?Activity Tolerance: Patient limited by pain ?Patient left: in bed;with call bell/phone within reach (chair position) ?Nurse Communication: Mobility status ?PT Visit Diagnosis: Other abnormalities of gait and mobility (R26.89);Muscle weakness (generalized) (M62.81);Difficulty  in walking, not elsewhere classified (R26.2);Pain ?Pain - Right/Left:  (bil, generalized) ?Pain - part of body: Knee;Leg;Shoulder (generalized back pain) ?  ? ?Time: 9597-4718 ?PT Time Calculation (min) (ACUTE ONLY): 27 min ? ? ?Charges:   PT Evaluation ?$PT Eval Moderate Complexity: 1 Mod ?  ?  ?   ? ? ?Gwynneth Albright PT, DPT ?Acute Rehabilitation Services ?Office [phone] ?Pager [phone]  ? ?Jacques Navy ?01/08/2022, 10:04 AM ? ?

## 2022-01-09 DIAGNOSIS — E871 Hypo-osmolality and hyponatremia: Secondary | ICD-10-CM | POA: Diagnosis not present

## 2022-01-09 DIAGNOSIS — E1169 Type 2 diabetes mellitus with other specified complication: Secondary | ICD-10-CM | POA: Diagnosis not present

## 2022-01-09 DIAGNOSIS — F1010 Alcohol abuse, uncomplicated: Secondary | ICD-10-CM

## 2022-01-09 DIAGNOSIS — N179 Acute kidney failure, unspecified: Secondary | ICD-10-CM | POA: Diagnosis not present

## 2022-01-09 DIAGNOSIS — R7881 Bacteremia: Secondary | ICD-10-CM | POA: Diagnosis not present

## 2022-01-09 LAB — SURGICAL PATHOLOGY

## 2022-01-09 LAB — GLUCOSE, CAPILLARY
Glucose-Capillary: 117 mg/dL — ABNORMAL HIGH (ref 70–99)
Glucose-Capillary: 148 mg/dL — ABNORMAL HIGH (ref 70–99)
Glucose-Capillary: 132 mg/dL — ABNORMAL HIGH (ref 70–99)
Glucose-Capillary: 146 mg/dL — ABNORMAL HIGH (ref 70–99)

## 2022-01-09 NOTE — Progress Notes (Signed)
?PROGRESS NOTE ? ? ? ?Vincent Cable.  KDX:[id number] DOB: [birth year] DOA: 01/04/2022 ?PCP: Leighton Ruff, MD (Inactive)  ? ? ?Brief Narrative:  ?Vincent Seidel. is a 71 year old male with past medical history significant for essential pretension, hyperlipidemia, cervical spondylosis, gout, vitamin D deficiency, mitral valve insufficiency, pulmonary HTN, type 2 diabetes mellitus, OSA on CPAP who presented to [facility] ED on 4/6 via EMS with generalized weakness, fatigue, poor oral intake and inability to ambulate over the last 2-3 weeks.  Patient also complaining of increased knee pain thought to be secondary to an acute gout flare in which he was drinking "tart cherry juice"; as he heard this may help with gout flares.  Patient has not been eating or drinking much over the last week as well other than hard apple cider 6-12 cans daily.  Also reports right second toe wound following cutting his toenails.  He denies fever, no chills, no sore throat, no shortness of breath, no cough, no chest pain, no palpitations, no diaphoresis, no urinary symptoms, no visual changes. ? ?In the ED, temperature 99.4, HR 108, RR 31, BP 124/78, SPO2 94% on room air.  Sodium 118, potassium 4.8, chloride 81, CO2 21, glucose 190, BUN 63, creatinine 2.20, alkaline phosphatase 127, AST 67, ALT 70, total bilirubin 1.2.  Lactic acid 3.2.  WBC 31.0, hemoglobin 14.3, platelets 541.  COVID-19 PCR negative.  Influenza A/B PCR negative.  Urinalysis with large hemoglobin, trace leukocytes, negative nitrite, rare bacteria, greater than 11-20 WBCs.  Chest x-ray with hypoventilation, elevated right hemidiaphragm with right lower lobe airspace disease consistent with atelectasis.  Patient was given acetaminophen 1 g p.o. x1, azithromycin 5 mg IV, ceftriaxone 1 g IV, oxycodone 5 mg p.o. and 1 L NS bolus.  Hospital service consulted for further evaluation and management of sepsis. ? ?01/06/2022: Patient seen.  Available records reviewed.  Patient has undergone  amputation of the right second toe.  Likely, patient has previously undocumented liver cirrhosis.  We will pursue an abdominal ultrasound.  In the setting of possible acute kidney injury, will also start patient on midodrine and IV albumin.  We discontinued IV fluids.  Infectious disease input is appreciated.  We will check acute hepatitis panel and cryo.  Patient seems volume overloaded.  Will discontinue IV fluid.  Overall, patient is very sick with guarded prognosis. ? ?01/07/2022: Patient seen alongside patient's wife.  Patient looks better today.  Patient looks less edematous today.  We will continue IV albumin.  Abdominal ultrasound revealed well distended gallbladder with gallbladder sludge.  No cholelithiasis was reported.  Patient is documented to have cirrhosis.  Blood and urine cultures have grown MSSA.  Communicated with infectious disease physician, Dr.Comer, patient will need TEE.  TTE is nonrevealing.  Normal EF.  Diastolic function could not be determined. ? ?01/08/2022: Patient seen alongside patient's months.  Patient continues to look better.  Patient reports back pain.  We used lidocaine patch and, increased oxycodone from 5 Mg p.o. every 4 hourly to 10 Mg p.o. every 4 hourly as needed.  Acute kidney injury has resolved.  Cardiology team has been consulted for TEE.  Continue antibiotics.  Edema has improved. ? ?01/09/22: Patient seen and evaluated. Improving slowly, somewhat depressed, does relay SI in the past.  ?Infectious disease pointed out small fluid collection around the lateral aspect of the base of the fifth metatarsal on the left that is 1.6 x 0.7 cm in dimensions , will request Podiatry evaluation ?further debridement. ? ?Assessment &  Plan: ? ?Severe sepsis, POA ?MSSA septicemia, POA ?Right second toe diabetic wound/osteomyelitis ?Patient presenting to the ED with progressive weakness, fatigue, decreased ambulation and poor oral intake.  Patient was febrile up to 101.9, tachycardic,  tachypneic with elevated lactic acid of 3.2 on admission.  WBC count elevated 31.0.  Chest x-ray with right lower lobe airspace disease consistent with atelectasis versus pneumonia.  Right second toe x-ray with skin and soft tissue irregularity around the distal digit, indistinct cortex of the distal tuft of the second distal phalanx suspicious for osteomyelitis. ?--Infectious disease and podiatry consulted ?--WBC 31.0>26.7 ?--Blood cultures x2 4/6: BCID + MSSA; awaiting further identification/susceptibilities ?--TTE: Pending ?--Cefazolin 2 g IV every 8 hours ?--CBC daily. ?--Supportive care, pain control, IV fluid hydration ?01/06/2022: Infectious disease and podiatry input is appreciated.  Patient has undergone amputation of the right second toe.  The rash on the right lower leg may be related to the patient's undiagnosed likely liver disease/cirrhosis.  Viral hepatitis status is unknown.  We will check acute hepatitis panel.  Check cryoglobulin level.  Right upper quadrant ultrasound to confirm if patient has cirrhosis.  Cirrhosis could be secondary to the alcohol on NASH or combined effect of both.  Guarded prognosis.  Antibiotics as per infectious disease team. ?01/07/2022: See above documentation.  Blood and urine cultures have grown MSSA.  Patient will need TEE.  We will consult cardiology team.  Patient is currently on IV cefazolin.  Infectious diseases directing care. ?01/08/2022: Continue antibiotics.  Leukocytosis is improving.  Overall, patient looks better.  Sepsis physiology has resolved.  Will discontinue midodrine. ?4/11: small fluid collection around the lateral aspect of the base of the fifth metatarsal on the left that is 1.6 x 0.7 cm in dimensions , requesting eval by Podiatry team. Cont abx.  ? ?Hyponatremia ?Sodium 118 on admission.  Etiology likely secondary to EtOH abuse complicated by severe sepsis as above and likely dehydration from poor oral intake in the days preceding hospitalization.  Urine  osmolality 316, urine sodium less than 10. ?--Na 118>>122 ?--Continue IV fluid hydration w/ NA at 116m/hr ?--Na level q12h ?-- Monitor on telemetry ?01/08/2022: Hyponatremia is likely multifactorial.  Suspect secondary to combined alcohol abuse, liver disease, ineffective circulatory arterial volume and cannot rule out SIADH.  May also have prognostic significance.  Sodium is slowly improving.  Sodium is 134 today.   ? ?Lower extremity edema ?Etiology likely secondary to acute infectious process.  Previous TTE with LVEF 683-15% normal diastolic parameters on 61/76/1607 ?--Vascular duplex ultrasound bilateral lower extremities to rule out DVT ?--TTE pending as above ?--Lower extremity elevation ?01/08/2022: Improved significantly.  Patient has been on IV albumin.   ? ?Acute renal failure ?Creatinine 2.20 on admission.  Etiology likely secondary to prerenal azotemia in the setting of dehydration and poor oral intake in the days preceding hospitalization versus ATN from underlying severe sepsis as above with septicemia.  Additionally, patient with acute urinary retention likely contributing as well. ?--Cr 2.20>>1.81 ?--Holding home losartan/metformin ?--IV fluid hydration ?--Foley catheter ordered/7 ?--Strict I's and O's ?--Avoid nephrotoxins, renally dose all medications ?--BMP daily ?01/06/2022: Hopefully, will not delay with hepatorenal syndrome.  Start IV albumin and midodrine. ?01/07/2022: Slowly improving.  Possibly secondary to ATN/ineffective arterial circulatory volume.  Continue IV albumin and midodrine. ?01/08/2022: Acute kidney injury has resolved.  Discontinue midodrine. ? ?Hx Gout ?Patient with reported history of gout.  Takes Celebrex and Norco as needed.  Not on allopurinol outpatient.  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  Patient has been having trouble ambulating over the last 2 weeks and thought this was too due  to acute gout flare that he was self treating with "tart cherry juice".  Bilateral knees with pain on active/passive range of motion, but no erythema/fluctuance and nontender to palpation.  Has been seen by or

## 2022-01-09 NOTE — Progress Notes (Addendum)
? ? ? ? ? ? ?Subjective: ?He is complaining of fevers and chills ? ? ?Antibiotics:  ?Anti-infectives (From admission, onward)  ? ? Start     Dose/Rate Route Frequency Ordered Stop  ? 01/05/22 1400  azithromycin (ZITHROMAX) 500 mg in sodium chloride 0.9 % 250 mL IVPB  Status:  Discontinued       ? 500 mg ?250 mL/hr over 60 Minutes Intravenous Every 24 hours 01/04/22 1257 01/05/22 0851  ? 01/05/22 1200  cefTRIAXone (ROCEPHIN) 1 g in sodium chloride 0.9 % 100 mL IVPB  Status:  Discontinued       ? 1 g ?200 mL/hr over 30 Minutes Intravenous Every 24 hours 01/04/22 1257 01/05/22 0226  ? 01/05/22 0400  ceFAZolin (ANCEF) IVPB 2g/100 mL premix       ? 2 g ?200 mL/hr over 30 Minutes Intravenous Every 8 hours 01/05/22 0226    ? 01/04/22 1215  cefTRIAXone (ROCEPHIN) 2 g in sodium chloride 0.9 % 100 mL IVPB       ? 2 g ?200 mL/hr over 30 Minutes Intravenous  Once 01/04/22 1208 01/04/22 1338  ? 01/04/22 1215  azithromycin (ZITHROMAX) 500 mg in sodium chloride 0.9 % 250 mL IVPB       ? 500 mg ?250 mL/hr over 60 Minutes Intravenous  Once 01/04/22 1208 01/04/22 1442  ? ?  ? ? ?Medications: ?Scheduled Meds: ? amLODipine  10 mg Oral Daily  ? Chlorhexidine Gluconate Cloth  6 each Topical Daily  ? colchicine  0.3 mg Oral BID  ? And  ? probenecid  500 mg Oral BID  ? folic acid  1 mg Oral Daily  ? insulin aspart  0-15 Units Subcutaneous TID WC  ? lidocaine  1 patch Transdermal Q24H  ? multivitamin with minerals  1 tablet Oral Daily  ? thiamine  100 mg Oral Daily  ? Or  ? thiamine  100 mg Intravenous Daily  ? ?Continuous Infusions: ?  ceFAZolin (ANCEF) IV 2 g (01/09/22 1156)  ? ?PRN Meds:.acetaminophen **OR** acetaminophen, albuterol, ipratropium, methocarbamol, ondansetron **OR** ondansetron (ZOFRAN) IV, oxyCODONE ? ? ? ?Objective: ?Weight change:  ? ?Intake/Output Summary (Last 24 hours) at 01/09/2022 1436 ?Last data filed at 01/09/2022 1300 ?Gross per 24 hour  ?Intake 200 ml  ?Output 3975 ml  ?Net -3775 ml  ? ? ?Blood pressure 130/81,  pulse 95, temperature 98.5 ?F (36.9 ?C), temperature source Oral, resp. rate 20, height '5\' 6"'$  (1.676 m), weight 86.4 kg, SpO2 96 %. ?Temp:  [97.6 ?F (36.4 ?C)-98.6 ?F (37 ?C)] 98.5 ?F (36.9 ?C) (04/11 1407) ?Pulse Rate:  [95-110] 95 (04/11 1407) ?Resp:  [20-22] 20 (04/11 1407) ?BP: (103-145)/(74-92) 130/81 (04/11 1407) ?SpO2:  [91 %-100 %] 96 % (04/11 1407) ? ?Physical Exam: ?Physical Exam ?Constitutional:   ?   Appearance: He is well-developed. He is ill-appearing.  ?HENT:  ?   Head: Normocephalic and atraumatic.  ?Eyes:  ?   Conjunctiva/sclera: Conjunctivae normal.  ?Cardiovascular:  ?   Rate and Rhythm: Regular rhythm. Tachycardia present.  ?   Heart sounds: No murmur heard. ?  No friction rub.  ?Pulmonary:  ?   Effort: Pulmonary effort is normal. No respiratory distress.  ?   Breath sounds: No stridor. No wheezing, rhonchi or rales.  ?Abdominal:  ?   General: There is no distension.  ?   Palpations: Abdomen is soft. There is mass.  ?Musculoskeletal:  ?   Cervical back: Normal range of motion and neck supple.  ?  Left ankle: Tenderness present. Decreased range of motion.  ?   Comments: Right foot bandaged  ?Skin: ?   General: Skin is warm and dry.  ?   Coloration: Skin is pale.  ?   Findings: No erythema.  ?Neurological:  ?   General: No focal deficit present.  ?   Mental Status: He is alert and oriented to person, place, and time.  ?Psychiatric:     ?   Attention and Perception: Attention normal.     ?   Mood and Affect: Mood is depressed.     ?   Speech: Speech normal.     ?   Behavior: Behavior normal.     ?   Thought Content: Thought content normal.     ?   Cognition and Memory: Cognition and memory normal.     ?   Judgment: Judgment normal.  ?  ? ?CBC: ? ? ? ?BMET ?Recent Labs  ?  01/07/22 ?0240 01/07/22 ?1513 01/08/22 ?0244  ?NA 132* 132* 134*  ?K 4.0  --  3.6  ?CL 99  --  103  ?CO2 21*  --  23  ?GLUCOSE 137*  --  115*  ?BUN 56*  --  50*  ?CREATININE 1.50*  --  0.94  ?CALCIUM 8.1*  --  8.5*  ? ? ? ? ?Liver  Panel ? ?Recent Labs  ?  01/07/22 ?0240 01/08/22 ?0244  ?PROT 5.8* 6.0*  ?ALBUMIN 2.4* 3.0*  ?AST 46* 50*  ?ALT 22 18  ?ALKPHOS 139* 100  ?BILITOT 1.0 1.0  ? ? ? ? ? ? ?Sedimentation Rate ?No results for input(s): ESRSEDRATE in the last 72 hours. ?C-Reactive Protein ?No results for input(s): CRP in the last 72 hours. ? ?Micro Results: ?Recent Results (from the past 720 hour(s))  ?Blood Culture (routine x 2)     Status: Abnormal  ? Collection Time: 01/04/22 10:55 AM  ? Specimen: BLOOD  ?Result Value Ref Range Status  ? Specimen Description   Final  ?  BLOOD LEFT ANTECUBITAL ?Performed at [facility], [place] [address] [place], [place] [address] ?  ? Special Requests   Final  ?  BOTTLES DRAWN AEROBIC AND ANAEROBIC Blood Culture adequate volume ?Performed at [facility], [place] [address] [place], [place] [address] ?  ? Culture  Setup Time   Final  ?  GRAM POSITIVE COCCI ?IN BOTH AEROBIC AND ANAEROBIC BOTTLES ?CRITICAL VALUE NOTED.  VALUE IS CONSISTENT WITH PREVIOUSLY REPORTED AND CALLED VALUE. ?  ? Culture (A)  Final  ?  STAPHYLOCOCCUS AUREUS ?SUSCEPTIBILITIES PERFORMED ON PREVIOUS CULTURE WITHIN THE LAST 5 DAYS. ?Performed at [facility], [place] [address] [place], [place] [address] ?  ? Report Status 01/07/2022 FINAL  Final  ?Blood Culture (routine x 2)     Status: Abnormal  ? Collection Time: 01/04/22 10:56 AM  ? Specimen: BLOOD  ?Result Value Ref Range Status  ? Specimen Description   Final  ?  BLOOD RIGHT ANTECUBITAL ?Performed at [facility], [place] [address] [place], [place] [address] ?  ? Special Requests   Final  ?  BOTTLES DRAWN AEROBIC AND ANAEROBIC Blood Culture results may not be optimal due to an excessive volume of blood received in culture bottles ?Performed at [facility], [place] [address], [place], [place] [address] ?  ? Culture  Setup Time   Final  ?  GRAM POSITIVE COCCI ?AEROBIC BOTTLE ONLY ?IN BOTH AEROBIC  AND ANAEROBIC  BOTTLES ?CRITICAL RESULT CALLED TO, READ BACK BY AND VERIFIED WITH: ?PHARMD ELLEN JACKSON 01/05/22'@2'$ :14 BY TW ?Performed at [facility], [place] [address], [place], [place] [address] ?  ? Culture STAPHYLOCOCCUS AUREUS (A)  Final  ? Report Status 01/07/2022 FINAL  Final  ? Organism ID, Bacteria STAPHYLOCOCCUS AUREUS  Final  ?    Susceptibility  ? Staphylococcus aureus - MIC*  ?  CIPROFLOXACIN <=0.5 SENSITIVE Sensitive   ?  ERYTHROMYCIN <=0.25 SENSITIVE Sensitive   ?  GENTAMICIN <=0.5 SENSITIVE Sensitive   ?  OXACILLIN 0.5 SENSITIVE Sensitive   ?  TETRACYCLINE <=1 SENSITIVE Sensitive   ?  VANCOMYCIN <=0.5 SENSITIVE Sensitive   ?  TRIMETH/SULFA <=10 SENSITIVE Sensitive   ?  CLINDAMYCIN <=0.25 SENSITIVE Sensitive   ?  RIFAMPIN <=0.5 SENSITIVE Sensitive   ?  Inducible Clindamycin NEGATIVE Sensitive   ?  * STAPHYLOCOCCUS AUREUS  ?Blood Culture ID Panel (Reflexed)     Status: Abnormal  ? Collection Time: 01/04/22 10:56 AM  ?Result Value Ref Range Status  ? Enterococcus faecalis NOT DETECTED NOT DETECTED Final  ? Enterococcus Faecium NOT DETECTED NOT DETECTED Final  ? Listeria monocytogenes NOT DETECTED NOT DETECTED Final  ? Staphylococcus species DETECTED (A) NOT DETECTED Final  ?  Comment: CRITICAL RESULT CALLED TO, READ BACK BY AND VERIFIED WITH: ?PHARMD ELLEN JACKSON 01/05/22'@2'$ :14 BY TW ?  ? Staphylococcus aureus (BCID) DETECTED (A) NOT DETECTED Final  ?  Comment: CRITICAL RESULT CALLED TO, READ BACK BY AND VERIFIED WITH: ?PHARMD ELLEN JACKSON 01/05/22'@2'$ :14 BY TW ?  ? Staphylococcus epidermidis NOT DETECTED NOT DETECTED Final  ? Staphylococcus lugdunensis NOT DETECTED NOT DETECTED Final  ? Streptococcus species NOT DETECTED NOT DETECTED Final  ? Streptococcus agalactiae NOT DETECTED NOT DETECTED Final  ? Streptococcus pneumoniae NOT DETECTED NOT DETECTED Final  ? Streptococcus pyogenes NOT DETECTED NOT DETECTED Final  ? A.calcoaceticus-baumannii NOT DETECTED NOT DETECTED Final  ? Bacteroides fragilis NOT DETECTED NOT  DETECTED Final  ? Enterobacterales NOT DETECTED NOT DETECTED Final  ? Enterobacter cloacae complex NOT DETECTED NOT DETECTED Final  ? Escherichia coli NOT DETECTED NOT DETECTED Final  ? Klebsiella aerogen

## 2022-01-10 ENCOUNTER — Encounter (HOSPITAL_COMMUNITY)
Admission: EM | Disposition: A | Discharge status: Hospice | Payer: Self-pay | Source: Home / Self Care | Attending: Family Medicine

## 2022-01-10 DIAGNOSIS — E1169 Type 2 diabetes mellitus with other specified complication: Secondary | ICD-10-CM | POA: Diagnosis not present

## 2022-01-10 DIAGNOSIS — M86671 Other chronic osteomyelitis, right ankle and foot: Secondary | ICD-10-CM

## 2022-01-10 DIAGNOSIS — R7881 Bacteremia: Secondary | ICD-10-CM | POA: Diagnosis not present

## 2022-01-10 DIAGNOSIS — F331 Major depressive disorder, recurrent, moderate: Secondary | ICD-10-CM

## 2022-01-10 DIAGNOSIS — E871 Hypo-osmolality and hyponatremia: Secondary | ICD-10-CM | POA: Diagnosis not present

## 2022-01-10 DIAGNOSIS — N179 Acute kidney failure, unspecified: Secondary | ICD-10-CM | POA: Diagnosis not present

## 2022-01-10 LAB — CBC
HCT: 30.3 % — ABNORMAL LOW (ref 39.0–52.0)
Hemoglobin: 10.3 g/dL — ABNORMAL LOW (ref 13.0–17.0)
MCH: 30.6 pg (ref 26.0–34.0)
MCHC: 34 g/dL (ref 30.0–36.0)
MCV: 89.9 fL (ref 80.0–100.0)
Platelets: 455 10*3/uL — ABNORMAL HIGH (ref 150–400)
RBC: 3.37 MIL/uL — ABNORMAL LOW (ref 4.22–5.81)
RDW: 14.7 % (ref 11.5–15.5)
WBC: 20.5 10*3/uL — ABNORMAL HIGH (ref 4.0–10.5)
nRBC: 0 % (ref 0.0–0.2)

## 2022-01-10 LAB — RENAL FUNCTION PANEL
Albumin: 2.2 g/dL — ABNORMAL LOW (ref 3.5–5.0)
Anion gap: 11 (ref 5–15)
BUN: 45 mg/dL — ABNORMAL HIGH (ref 8–23)
CO2: 21 mmol/L — ABNORMAL LOW (ref 22–32)
Calcium: 8.3 mg/dL — ABNORMAL LOW (ref 8.9–10.3)
Chloride: 97 mmol/L — ABNORMAL LOW (ref 98–111)
Creatinine, Ser: 1.51 mg/dL — ABNORMAL HIGH (ref 0.61–1.24)
GFR, Estimated: 49 mL/min — ABNORMAL LOW (ref >60)
Glucose, Bld: 142 mg/dL — ABNORMAL HIGH (ref 70–99)
Phosphorus: 4 mg/dL (ref 2.5–4.6)
Potassium: 3.8 mmol/L (ref 3.5–5.1)
Sodium: 129 mmol/L — ABNORMAL LOW (ref 135–145)

## 2022-01-10 LAB — GLUCOSE, CAPILLARY
Glucose-Capillary: 92 mg/dL (ref 70–99)
Glucose-Capillary: 139 mg/dL — ABNORMAL HIGH (ref 70–99)
Glucose-Capillary: 133 mg/dL — ABNORMAL HIGH (ref 70–99)
Glucose-Capillary: 125 mg/dL — ABNORMAL HIGH (ref 70–99)

## 2022-01-10 LAB — CULTURE, BLOOD (ROUTINE X 2)

## 2022-01-10 SURGERY — INCISION AND DRAINAGE
Anesthesia: Choice | Laterality: Left

## 2022-01-10 MED ORDER — DULOXETINE HCL 20 MG PO CPEP
20.0000 mg | ORAL_CAPSULE | Freq: Every day | ORAL | Status: DC
Start: 2022-01-10 — End: 2022-01-14
  Administered 2022-01-10 – 2022-01-14 (×5): 20 mg via ORAL
  Filled 2022-01-10 (×5): qty 1

## 2022-01-10 NOTE — Progress Notes (Signed)
?PROGRESS NOTE ? ? ? ?Vincent White.  DPO:[id number] DOB: 11-07-1950 DOA: 01/04/2022 ?PCP: Leighton Ruff, MD (Inactive)  ? ? ?Brief Narrative:  ?Vincent Everingham. is a 71 year old male with past medical history significant for essential pretension, hyperlipidemia, cervical spondylosis, gout, vitamin D deficiency, mitral valve insufficiency, pulmonary HTN, type 2 diabetes mellitus, OSA on CPAP who presented to [facility] ED on 4/6 via EMS with generalized weakness, fatigue, poor oral intake and inability to ambulate over the last 2-3 weeks.  Patient also complaining of increased knee pain thought to be secondary to an acute gout flare in which he was drinking "tart cherry juice"; as he heard this may help with gout flares.  Patient has not been eating or drinking much over the last week as well other than hard apple cider 6-12 cans daily.  Also reports right second toe wound following cutting his toenails.  He denies fever, no chills, no sore throat, no shortness of breath, no cough, no chest pain, no palpitations, no diaphoresis, no urinary symptoms, no visual changes. ? ?In the ED, temperature 99.4, HR 108, RR 31, BP 124/78, SPO2 94% on room air.  Sodium 118, potassium 4.8, chloride 81, CO2 21, glucose 190, BUN 63, creatinine 2.20, alkaline phosphatase 127, AST 67, ALT 70, total bilirubin 1.2.  Lactic acid 3.2.  WBC 31.0, hemoglobin 14.3, platelets 541.  COVID-19 PCR negative.  Influenza A/B PCR negative.  Urinalysis with large hemoglobin, trace leukocytes, negative nitrite, rare bacteria, greater than 11-20 WBCs.  Chest x-ray with hypoventilation, elevated right hemidiaphragm with right lower lobe airspace disease consistent with atelectasis.  Patient was given acetaminophen 1 g p.o. x1, azithromycin 5 mg IV, ceftriaxone 1 g IV, oxycodone 5 mg p.o. and 1 L NS bolus.  Hospital service consulted for further evaluation and management of sepsis. ? ?01/06/2022: Patient seen.  Available records reviewed.  Patient has undergone  amputation of the right second toe.  Likely, patient has previously undocumented liver cirrhosis.  We will pursue an abdominal ultrasound.  In the setting of possible acute kidney injury, will also start patient on midodrine and IV albumin.  We discontinued IV fluids.  Infectious disease input is appreciated.  We will check acute hepatitis panel and cryo.  Patient seems volume overloaded.  Will discontinue IV fluid.  Overall, patient is very sick with guarded prognosis. ? ?01/07/2022: Patient seen alongside patient's wife.  Patient looks better today.  Patient looks less edematous today.  We will continue IV albumin.  Abdominal ultrasound revealed well distended gallbladder with gallbladder sludge.  No cholelithiasis was reported.  Patient is documented to have cirrhosis.  Blood and urine cultures have grown MSSA.  Communicated with infectious disease physician, Dr.Comer, patient will need TEE.  TTE is nonrevealing.  Normal EF.  Diastolic function could not be determined. ? ?01/08/2022: Patient seen alongside patient's months.  Patient continues to look better.  Patient reports back pain.  We used lidocaine patch and, increased oxycodone from 5 Mg p.o. every 4 hourly to 10 Mg p.o. every 4 hourly as needed.  Acute kidney injury has resolved.  Cardiology team has been consulted for TEE.  Continue antibiotics.  Edema has improved. ? ?01/09/22: Patient seen and evaluated. Improving slowly, somewhat depressed, does relay SI in the past.  ?Infectious disease pointed out small fluid collection around the lateral aspect of the base of the fifth metatarsal on the left that is 1.6 x 0.7 cm in dimensions , will request Podiatry evaluation ?further debridement. ?4/12- ?Psych-started cymbalta,  no acute concern for SI. ?ID- recommended repeat Bcx, MRI C/L spine, I talked with patient we will obtain as soon as possible  ?Podiatric Surgeon- bedside I&D, successful conducted 4/12. ?Hosp- appreciate specialists assistance, will formalize  plan for dc based on needs. Likely 48h plan for DC. ? ?Assessment & Plan: ? ?Severe sepsis, POA ?MSSA septicemia, POA ?Right second toe diabetic wound/osteomyelitis ?Patient presenting to the ED with progressive weakness, fatigue, decreased ambulation and poor oral intake.  Patient was febrile up to 101.9, tachycardic, tachypneic with elevated lactic acid of 3.2 on admission.  WBC count elevated 31.0.  Chest x-ray with right lower lobe airspace disease consistent with atelectasis versus pneumonia.  Right second toe x-ray with skin and soft tissue irregularity around the distal digit, indistinct cortex of the distal tuft of the second distal phalanx suspicious for osteomyelitis. ?--Infectious disease and podiatry consulted ?--WBC 31.0>26.7 ?--Blood cultures x2 4/6: BCID + MSSA; awaiting further identification/susceptibilities ?--TTE: Pending ?--Cefazolin 2 g IV every 8 hours ?--CBC daily. ?--Supportive care, pain control, IV fluid hydration ?01/06/2022: Infectious disease and podiatry input is appreciated.  Patient has undergone amputation of the right second toe.  The rash on the right lower leg may be related to the patient's undiagnosed likely liver disease/cirrhosis.  Viral hepatitis status is unknown.  We will check acute hepatitis panel.  Check cryoglobulin level.  Right upper quadrant ultrasound to confirm if patient has cirrhosis.  Cirrhosis could be secondary to the alcohol on NASH or combined effect of both.  Guarded prognosis.  Antibiotics as per infectious disease team. ?01/07/2022: See above documentation.  Blood and urine cultures have grown MSSA.  Patient will need TEE.  We will consult cardiology team.  Patient is currently on IV cefazolin.  Infectious diseases directing care. ?01/08/2022: Continue antibiotics.  Leukocytosis is improving.  Overall, patient looks better.  Sepsis physiology has resolved.  Will discontinue midodrine. ?4/11: small fluid collection around the lateral aspect of the base of the  fifth metatarsal on the left that is 1.6 x 0.7 cm in dimensions , requesting eval by Podiatry team. Cont abx.  ?4/12 fluid collection drained bedsided by Podiatry ?ID repeat Bcx, MRI C/L spine ordered given pain complaints by patient ? ?Hyponatremia ?Sodium 118 on admission.  Etiology likely secondary to EtOH abuse complicated by severe sepsis as above and likely dehydration from poor oral intake in the days preceding hospitalization.  Urine osmolality 316, urine sodium less than 10. ?--Na 118>>122 ?--Continue IV fluid hydration w/ NA at 169m/hr ?--Na level q12h ?-- Monitor on telemetry ?01/08/2022: Hyponatremia is likely multifactorial.  Suspect secondary to combined alcohol abuse, liver disease, ineffective circulatory arterial volume and cannot rule out SIADH.  May also have prognostic significance.  Sodium is slowly improving.  Sodium is 134 today.   ? ?Lower extremity edema ?Etiology likely secondary to acute infectious process.  Previous TTE with LVEF 660-73% normal diastolic parameters on 67/07/6268 ?--Vascular duplex ultrasound bilateral lower extremities to rule out DVT ?--TTE pending as above ?--Lower extremity elevation ?01/08/2022: Improved significantly.  Patient has been on IV albumin.   ? ?Acute renal failure ?Creatinine 2.20 on admission.  Etiology likely secondary to prerenal azotemia in the setting of dehydration and poor oral intake in the days preceding hospitalization versus ATN from underlying severe sepsis as above with septicemia.  Additionally, patient with acute urinary retention likely contributing as well. ?--Cr 2.20>>1.81 ?--Holding home losartan/metformin ?--IV fluid hydration ?--Foley catheter ordered/7 ?--Strict I's and O's ?--Avoid nephrotoxins, renally dose all medications ?--  BMP daily ?01/06/2022: Hopefully, will not delay with hepatorenal syndrome.  Start IV albumin and midodrine. ?01/07/2022: Slowly improving.  Possibly secondary to ATN/ineffective arterial circulatory volume.   Continue IV albumin and midodrine. ?01/08/2022: Acute kidney injury has resolved.  Discontinue midodrine. ? ?Hx Gout ?Patient with reported history of gout.  Takes Celebrex and Norco as needed.  Not on allopurin

## 2022-01-10 NOTE — Consult Note (Addendum)
[facility] Face-to-Face Psychiatry Consult  ? ?Reason for Consult:  depression, history of suicidal ideations ?Referring Physician:  Dr Alanda Amass ?Patient Identification: Vincent White. ?MRN:  [id number] ?Principal Diagnosis: Hyponatremia ?Diagnosis:  Principal Problem: ?  Hyponatremia ?Active Problems: ?  Major depressive disorder, recurrent episode, moderate ([place]) ?  Essential hypertension ?  Dyslipidemia ?  Type 2 diabetes mellitus with hyperglycemia ([facility]) ?  Gout ?  AKI (acute kidney injury) ([place]) ?  Transaminitis ?  CAP (community acquired pneumonia) ?  OSA (obstructive sleep apnea) ?  Injury of right second toe ?  Osteomyelitis of second toe of right foot ([place]) ?  Pressure injury of skin ?  MSSA bacteremia ?  Staphylococcal arthritis of left ankle ([place]) ? ? ?Total Time spent with patient: 45 minutes ? ?Subjective:   ?Vincent White. is a 71 y.o. male patient admitted with hyponatremia, gout, and arthritis.  "I only get depressed when I'm in pain.  I'm in a lot of pain right now with my arthritis and gout." ? ?HPI:  71 yo male presented to the ED from medical issues including back and gout pain.  Prior to admission, he was self-medicating with 6-12 hard ciders per day which he feels triggered his passive suicidal ideations.  Denies ever formulating a plan, "I also don't have the energy to hurt myself."  No suicidal ideations since admission.  There are guns in the homes that belong to his wife who was contacted with his permission, see note below.  His depression fluctuates with is pain.  Moderate anxiety as he "feel weaker and weaker", panic attacks at times, last one was two days ago.  Sleep is "good when I am not in pain."  Appetite fluctuates, decreased recently related to his pain and alcohol consumption. Recommended rehab for alcohol use d/o, client declined.  Denies withdrawal symptoms and cravings.  No psychosis, paranoia, or homicidal ideations.  He is agreeable to try something for his depression and anxiety,  discussed antidepressants and side effects. ? ?Collateral information from his wife per phone in an office with a witness, Vincent White, who agreed to secure or remove the guns prior to the client returning to the home.  She felt an antidepressant would be helpful and wanted him to try Cymbalta for his symptoms.  She is in agreement to have the alcohol out of the home prior to him returning. ? ?Past Psychiatric History: depression, anxiety, panic d/o, alcohol use d/o ? ?Risk to Self:  none ?Risk to Others:  none ?Prior Inpatient Therapy:  none ?Prior Outpatient Therapy:  none ? ?Past Medical History:  ?Past Medical History:  ?Diagnosis Date  ? Cervical spondylosis   ? Gout   ? History of kidney stones   ? HLD (hyperlipidemia)   ? Hypertension   ? OSA (obstructive sleep apnea)   ? CPAP  ? Vitamin D deficiency   ?  ?Past Surgical History:  ?Procedure Laterality Date  ? AMPUTATION TOE Right 01/06/2022  ? Procedure: AMPUTATION TOE;  Surgeon: Criselda Peaches, DPM;  Location: [facility] ORS;  Service: Podiatry;  Laterality: Right;  ? bone spur surgery  02/05/2017  ? mass removed on left elbow  ? BUNIONECTOMY Right 2014  ? SHOULDER SURGERY Right   ? thumb surgery Right   ? VASECTOMY    ? ?Family History:  ?Family History  ?Problem Relation Age of Onset  ? Cancer - Other Mother   ?     bone  ? Diabetes Mother   ?  Heart attack Father 85  ? Diabetes Father   ? Hypertension Father   ? Cancer - Other Sister   ?     breast  ? Gout Brother   ? Hypertension Maternal Grandmother   ? Hypertension Maternal Grandfather   ? Colon cancer Neg Hx   ? Liver disease Neg Hx   ? Kidney disease Neg Hx   ? ?Family Psychiatric  History: see above ?Social History:  ?Social History  ? ?Substance and Sexual Activity  ?Alcohol Use Yes  ? Alcohol/week: 1.0 standard drink  ? Types: 1 Standard drinks or equivalent per week  ?   ?Social History  ? ?Substance and Sexual Activity  ?Drug Use Never  ?  ?Social History  ? ?Socioeconomic History  ? Marital status:  Divorced  ?  Spouse name: Not on file  ? Number of children: 1  ? Years of education: Not on file  ? Highest education level: Not on file  ?Occupational History  ? Occupation: retired Programmer, systems  ?Tobacco Use  ? Smoking status: Never  ? Smokeless tobacco: Never  ?Vaping Use  ? Vaping Use: Never used  ?Substance and Sexual Activity  ? Alcohol use: Yes  ?  Alcohol/week: 1.0 standard drink  ?  Types: 1 Standard drinks or equivalent per week  ? Drug use: Never  ? Sexual activity: Not on file  ?Other Topics Concern  ? Not on file  ?Social History Narrative  ? Not on file  ? ?Social Determinants of Health  ? ?Financial Resource Strain: Not on file  ?Food Insecurity: Not on file  ?Transportation Needs: Not on file  ?Physical Activity: Not on file  ?Stress: Not on file  ?Social Connections: Not on file  ? ?Additional Social History: ?  ? ?Allergies:   ?Allergies  ?Allergen Reactions  ? Allopurinol Other (See Comments)  ?  Dehydrated   ? Flexeril [Cyclobenzaprine] Other (See Comments)  ?  confusion  ? Nasal Spray Other (See Comments)  ?  Elevated BP  ? ? ?Labs:  ?Results for orders placed or performed during the hospital encounter of 01/04/22 (from the past 48 hour(s))  ?Glucose, capillary     Status: Abnormal  ? Collection Time: 01/08/22 11:04 AM  ?Result Value Ref Range  ? Glucose-Capillary 163 (H) 70 - 99 mg/dL  ?  Comment: Glucose reference range applies only to samples taken after fasting for at least 8 hours.  ? Comment 1 Notify RN   ? Comment 2 Document in Chart   ?Culture, blood (routine x 2)     Status: None (Preliminary result)  ? Collection Time: 01/08/22 12:17 PM  ? Specimen: BLOOD  ?Result Value Ref Range  ? Specimen Description    ?  BLOOD LEFT ANTECUBITAL ?Performed at [facility], [place] [address] [place], [place] [address] ?  ? Special Requests    ?  BOTTLES DRAWN AEROBIC AND ANAEROBIC Blood Culture adequate volume ?Performed at [facility], [place] [address],  [place], [place] [address] ?  ? Culture    ?  NO GROWTH 2 DAYS ?Performed at [facility], [place] [address], [place], [place] [address] ?  ? Report Status PENDING   ?Culture, blood (routine x 2)     Status: Abnormal  ? Collection Time: 01/08/22 12:22 PM  ? Specimen: BLOOD  ?Result Value Ref Range  ? Specimen Description    ?  BLOOD BLOOD RIGHT HAND ?Performed at [facility], [place] [address], [place], [place] [address] ?  ? Special Requests    ?  BOTTLES DRAWN AEROBIC ONLY Blood Culture results may not be optimal due to an inadequate volume of blood received in culture bottles ?Performed at [facility], [place] [address], [place], [place] [address] ?  ? Culture  Setup Time    ?  GRAM POSITIVE COCCI IN CLUSTERS ?IN PEDIATRIC BOTTLE ?CRITICAL VALUE NOTED.  VALUE IS CONSISTENT WITH PREVIOUSLY REPORTED AND CALLED VALUE. ?  ? Culture (A)   ?  STAPHYLOCOCCUS AUREUS ?SUSCEPTIBILITIES PERFORMED ON PREVIOUS CULTURE WITHIN THE LAST 5 DAYS. ?Performed at [facility], [place] [address], [place], [place] [address] ?  ? Report Status 01/10/2022 FINAL   ?Glucose, capillary     Status: Abnormal  ? Collection Time: 01/08/22  4:24 PM  ?Result Value Ref Range  ? Glucose-Capillary 149 (H) 70 - 99 mg/dL  ?  Comment: Glucose reference range applies only to samples taken after fasting for at least 8 hours.  ?Glucose, capillary     Status: Abnormal  ? Collection Time: 01/08/22  9:24 PM  ?Result Value Ref Range  ? Glucose-Capillary 118 (H) 70 - 99 mg/dL  ?  Comment: Glucose reference range applies only to samples taken after fasting for at least 8 hours.  ?Glucose, capillary     Status: Abnormal  ? Collection Time: 01/09/22  7:37 AM  ?Result Value Ref Range  ? Glucose-Capillary 117 (H) 70 - 99 mg/dL  ?  Comment: Glucose reference range applies only to samples taken after fasting for at least 8 hours.  ?Glucose, capillary     Status: Abnormal  ? Collection Time: 01/09/22 11:16 AM  ?Result Value Ref Range   ? Glucose-Capillary 148 (H) 70 - 99 mg/dL  ?  Comment: Glucose reference range applies only to samples taken after fasting for at least 8 hours.  ?Glucose, capillary     Status: Abnormal  ? Collection Time:

## 2022-01-10 NOTE — Progress Notes (Signed)
?  Subjective:  ?Patient ID: Vincent White., male    DOB: 07-24-1951,  MRN: [id number] ? ?Overall he is doing okay.  He does not have any pain in the toe.  He also does not have any pain on the lateral side of the foot.  However on the MRI there is a concern for possible abscess formation therefore I believe patient will benefit from incision and drainage washout debridement at bedside.  He is very neuropathic. ? ?Fever: no ?Night sweats: no ?Objective:  ? ?Vitals:  ? 01/09/22 1950 01/10/22 0500  ?BP: 135/82 120/77  ?Pulse: (!) 106 97  ?Resp: 20 20  ?Temp: 98.5 ?F (36.9 ?C) 99.5 ?F (37.5 ?C)  ?SpO2: 96% 93%  ? ?General AA&O x3. Normal mood and affect.  ?Vascular Dorsalis pedis and posterior tibial pulses 2/4 bilat. ?Brisk capillary refill to all digits. Pedal hair present.  ?Neurologic Epicritic sensation grossly reduced.  ?Dermatologic Cellulitis and rash is improving seems to be more coalesced at this point.  Incision is well coapted. ? ?Right lateral foot superficial fluctuant area noted.  No erythema cellulitis noted.  No pain on palpation.  ?Orthopedic: MMT 5/5 in dorsiflexion, plantarflexion, inversion, and eversion. ?Normal joint ROM without pain or crepitus.  ? ? ?Assessment & Plan:  ?Patient was evaluated and treated and all questions answered. ? ?Right lateral foot superficial abscess status post bedside incision and drainage(postop day #1) ?-All questions and concerns were discussed with the patient ?-Consent was obtained ?-One-to-one mixture of 1% lidocaine plain half percent Marcaine plain 6 cc was injected circumferentially around the ankle joint for local anesthesia.  Once anesthesia was obtained the skin was prepped with Betadine.  Using # 11 blade the incision was made down to the level of the epidermal dermal down to the level of the deep tissue.  Hemostat was used to explore tissue plane.  Superficial purulent drainage was expressed.  Unable to take culture as there was no availability of culture  tube on the floor.  Saline was used to thoroughly irrigate the incision area.  No further purulent drainage was noted.  The incision was closed with staples.  Foot was wrapped with 4 x 4 gauze Kerlix Ace bandage. ?-Patient is okay to be discharged from podiatric standpoint ?-He can follow-up with me or Dr. Sherryle Lis 1 week from discharge ?-Weightbearing as tolerated in surgical shoe ?-Antibiotics per ID ? ?POD #2 status post right second toe amputation and osteomyelitis ?-Doing well no dressing changes needed.  WBAT in a postop shoe.  Continue antibiotics per ID.  We will continue to monitor his progress.  We will have my office coordinate outpatient follow-up. ? ?Felipa Furnace, DPM ? ?Accessible via secure chat for questions or concerns. ? ?

## 2022-01-10 NOTE — Plan of Care (Signed)
  Problem: Activity: Goal: Ability to tolerate increased activity will improve Outcome: Progressing   Problem: Clinical Measurements: Goal: Ability to maintain a body temperature in the normal range will improve Outcome: Progressing   

## 2022-01-10 NOTE — Progress Notes (Signed)
? ? ? ? ? ? ?Subjective: ? ?He is complaining of worsening pain in his ankle as well as neck pain and lower back pain that has worsened ? ? ? ?Antibiotics:  ?Anti-infectives (From admission, onward)  ? ? Start     Dose/Rate Route Frequency Ordered Stop  ? 01/05/22 1400  azithromycin (ZITHROMAX) 500 mg in sodium chloride 0.9 % 250 mL IVPB  Status:  Discontinued       ? 500 mg ?250 mL/hr over 60 Minutes Intravenous Every 24 hours 01/04/22 1257 01/05/22 0851  ? 01/05/22 1200  cefTRIAXone (ROCEPHIN) 1 g in sodium chloride 0.9 % 100 mL IVPB  Status:  Discontinued       ? 1 g ?200 mL/hr over 30 Minutes Intravenous Every 24 hours 01/04/22 1257 01/05/22 0226  ? 01/05/22 0400  ceFAZolin (ANCEF) IVPB 2g/100 mL premix       ? 2 g ?200 mL/hr over 30 Minutes Intravenous Every 8 hours 01/05/22 0226    ? 01/04/22 1215  cefTRIAXone (ROCEPHIN) 2 g in sodium chloride 0.9 % 100 mL IVPB       ? 2 g ?200 mL/hr over 30 Minutes Intravenous  Once 01/04/22 1208 01/04/22 1338  ? 01/04/22 1215  azithromycin (ZITHROMAX) 500 mg in sodium chloride 0.9 % 250 mL IVPB       ? 500 mg ?250 mL/hr over 60 Minutes Intravenous  Once 01/04/22 1208 01/04/22 1442  ? ?  ? ? ?Medications: ?Scheduled Meds: ? amLODipine  10 mg Oral Daily  ? Chlorhexidine Gluconate Cloth  6 each Topical Daily  ? colchicine  0.3 mg Oral BID  ? And  ? probenecid  500 mg Oral BID  ? DULoxetine  20 mg Oral Daily  ? folic acid  1 mg Oral Daily  ? insulin aspart  0-15 Units Subcutaneous TID WC  ? lidocaine  1 patch Transdermal Q24H  ? multivitamin with minerals  1 tablet Oral Daily  ? thiamine  100 mg Oral Daily  ? Or  ? thiamine  100 mg Intravenous Daily  ? ?Continuous Infusions: ?  ceFAZolin (ANCEF) IV 2 g (01/10/22 0321)  ? ?PRN Meds:.acetaminophen **OR** acetaminophen, albuterol, ipratropium, methocarbamol, ondansetron **OR** ondansetron (ZOFRAN) IV, oxyCODONE ? ? ? ?Objective: ?Weight change:  ? ?Intake/Output Summary (Last 24 hours) at 01/10/2022 1027 ?Last data filed at  01/10/2022 0500 ?Gross per 24 hour  ?Intake 120 ml  ?Output 2450 ml  ?Net -2330 ml  ? ? ?Blood pressure 120/77, pulse 97, temperature 99.5 ?F (37.5 ?C), temperature source Oral, resp. rate 20, height '5\' 6"'$  (1.676 m), weight 86.4 kg, SpO2 93 %. ?Temp:  [98.5 ?F (36.9 ?C)-99.5 ?F (37.5 ?C)] 99.5 ?F (37.5 ?C) (04/12 0500) ?Pulse Rate:  [95-106] 97 (04/12 0500) ?Resp:  [20] 20 (04/12 0500) ?BP: (120-135)/(77-82) 120/77 (04/12 0500) ?SpO2:  [93 %-96 %] 93 % (04/12 0500) ? ?Physical Exam: ?Physical Exam ?Constitutional:   ?   Appearance: He is well-developed. He is obese.  ?HENT:  ?   Head: Normocephalic and atraumatic.  ?Eyes:  ?   Conjunctiva/sclera: Conjunctivae normal.  ?Cardiovascular:  ?   Rate and Rhythm: Regular rhythm. Bradycardia present.  ?   Heart sounds: No murmur heard. ?  No friction rub. No gallop.  ?Pulmonary:  ?   Effort: Pulmonary effort is normal. No respiratory distress.  ?   Breath sounds: Normal breath sounds. No stridor. No wheezing.  ?Abdominal:  ?   General: There is no distension.  ?  Palpations: Abdomen is soft.  ?Musculoskeletal:  ?   Cervical back: Normal range of motion and neck supple.  ?Skin: ?   General: Skin is warm and dry.  ?   Findings: No erythema or rash.  ?Neurological:  ?   General: No focal deficit present.  ?   Mental Status: He is alert and oriented to person, place, and time.  ?   Comments: Grip was symmetric but 4 out of 5  ?Psychiatric:     ?   Mood and Affect: Mood normal.     ?   Behavior: Behavior normal.     ?   Thought Content: Thought content normal.     ?   Judgment: Judgment normal.  ?  ?Right foot with bandage, ? ?His ankle is tender ? ? ?CBC: ? ? ? ?BMET ?Recent Labs  ?  01/07/22 ?1513 01/08/22 ?0244  ?NA 132* 134*  ?K  --  3.6  ?CL  --  103  ?CO2  --  23  ?GLUCOSE  --  115*  ?BUN  --  50*  ?CREATININE  --  0.94  ?CALCIUM  --  8.5*  ? ? ? ? ?Liver Panel ? ?Recent Labs  ?  01/08/22 ?0244  ?PROT 6.0*  ?ALBUMIN 3.0*  ?AST 50*  ?ALT 18  ?ALKPHOS 100  ?BILITOT 1.0   ? ? ? ? ? ? ?Sedimentation Rate ?No results for input(s): ESRSEDRATE in the last 72 hours. ?C-Reactive Protein ?No results for input(s): CRP in the last 72 hours. ? ?Micro Results: ?Recent Results (from the past 720 hour(s))  ?Blood Culture (routine x 2)     Status: Abnormal  ? Collection Time: 01/04/22 10:55 AM  ? Specimen: BLOOD  ?Result Value Ref Range Status  ? Specimen Description   Final  ?  BLOOD LEFT ANTECUBITAL ?Performed at [facility], [place] [address], [place], [place] [address] ?  ? Special Requests   Final  ?  BOTTLES DRAWN AEROBIC AND ANAEROBIC Blood Culture adequate volume ?Performed at [facility], [place] [address], [place], [place] [address] ?  ? Culture  Setup Time   Final  ?  GRAM POSITIVE COCCI ?IN BOTH AEROBIC AND ANAEROBIC BOTTLES ?CRITICAL VALUE NOTED.  VALUE IS CONSISTENT WITH PREVIOUSLY REPORTED AND CALLED VALUE. ?  ? Culture (A)  Final  ?  STAPHYLOCOCCUS AUREUS ?SUSCEPTIBILITIES PERFORMED ON PREVIOUS CULTURE WITHIN THE LAST 5 DAYS. ?Performed at [facility], [place] [address], [place], [place] [address] ?  ? Report Status 01/07/2022 FINAL  Final  ?Blood Culture (routine x 2)     Status: Abnormal  ? Collection Time: 01/04/22 10:56 AM  ? Specimen: BLOOD  ?Result Value Ref Range Status  ? Specimen Description   Final  ?  BLOOD RIGHT ANTECUBITAL ?Performed at [facility], [place] [address] [place], [place] [address] ?  ? Special Requests   Final  ?  BOTTLES DRAWN AEROBIC AND ANAEROBIC Blood Culture results may not be optimal due to an excessive volume of blood received in culture bottles ?Performed at [facility], [place] [address], [place], [place] [address] ?  ? Culture  Setup Time   Final  ?  GRAM POSITIVE COCCI ?AEROBIC BOTTLE ONLY ?IN BOTH AEROBIC AND ANAEROBIC BOTTLES ?CRITICAL RESULT CALLED TO, READ BACK BY AND VERIFIED WITH: ?[place] 01/05/22'@2'$ :14 BY TW ?Performed at [facility], [place]  [address] [place], [place] [address] ?  ?  Culture STAPHYLOCOCCUS AUREUS (A)  Final  ? Report Status 01/07/2022 FINAL  Final  ? Organism ID, Bacteria STAPHYLOCOCCUS AUREUS  Final  ?    Susceptibility  ? Staphylococcus aureus - MIC*  ?  CIPROFLOXACIN <=0.5 SENSITIVE Sensitive   ?  ERYTHROMYCIN <=0.25 SENSITIVE Sensitive   ?  GENTAMICIN <=0.5 SENSITIVE Sensitive   ?  OXACILLIN 0.5 SENSITIVE Sensitive   ?  TETRACYCLINE <=1 SENSITIVE Sensitive   ?  VANCOMYCIN <=0.5 SENSITIVE Sensitive   ?  TRIMETH/SULFA <=10 SENSITIVE Sensitive   ?  CLINDAMYCIN <=0.25 SENSITIVE Sensitive   ?  RIFAMPIN <=0.5 SENSITIVE Sensitive   ?  Inducible Clindamycin NEGATIVE Sensitive   ?  * STAPHYLOCOCCUS AUREUS  ?Blood Culture ID Panel (Reflexed)     Status: Abnormal  ? Collection Time: 01/04/22 10:56 AM  ?Result Value Ref Range Status  ? Enterococcus faecalis NOT DETECTED NOT DETECTED Final  ? Enterococcus Faecium NOT DETECTED NOT DETECTED Final  ? Listeria monocytogenes NOT DETECTED NOT DETECTED Final  ? Staphylococcus species DETECTED (A) NOT DETECTED Final  ?  Comment: CRITICAL RESULT CALLED TO, READ BACK BY AND VERIFIED WITH: ?PHARMD ELLEN JACKSON 01/05/22'@2'$ :14 BY TW ?  ? Staphylococcus aureus (BCID) DETECTED (A) NOT DETECTED Final  ?  Comment: CRITICAL RESULT CALLED TO, READ BACK BY AND VERIFIED WITH: ?PHARMD ELLEN JACKSON 01/05/22'@2'$ :14 BY TW ?  ? Staphylococcus epidermidis NOT DETECTED NOT DETECTED Final  ? Staphylococcus lugdunensis NOT DETECTED NOT DETECTED Final  ? Streptococcus species NOT DETECTED NOT DETECTED Final  ? Streptococcus agalactiae NOT DETECTED NOT DETECTED Final  ? Streptococcus pneumoniae NOT DETECTED NOT DETECTED Final  ? Streptococcus pyogenes NOT DETECTED NOT DETECTED Final  ? A.calcoaceticus-baumannii NOT DETECTED NOT DETECTED Final  ? Bacteroides fragilis NOT DETECTED NOT DETECTED Final  ? Enterobacterales NOT DETECTED NOT DETECTED Final  ? Enterobacter cloacae complex NOT DETECTED NOT DETECTED Final  ? Escherichia coli NOT  DETECTED NOT DETECTED Final  ? Klebsiella aerogenes NOT DETECTED NOT DETECTED Final  ? Klebsiella oxytoca NOT DETECTED NOT DETECTED Final  ? Klebsiella pneumoniae NOT DETECTED NOT DETECTED Final  ? Proteus species NO

## 2022-01-10 NOTE — Plan of Care (Signed)
?  Problem: Activity: ?Goal: Ability to tolerate increased activity will improve ?01/10/2022 1846 by Zadie Rhine, RN ?Outcome: Progressing ?01/10/2022 1846 by Zadie Rhine, RN ?Outcome: Progressing ?  ?Problem: Clinical Measurements: ?Goal: Ability to maintain a body temperature in the normal range will improve ?01/10/2022 1846 by Zadie Rhine, RN ?Outcome: Progressing ?01/10/2022 1846 by Zadie Rhine, RN ?Outcome: Progressing ?  ?Problem: Activity: ?Goal: Ability to tolerate increased activity will improve ?01/10/2022 1846 by Zadie Rhine, RN ?Outcome: Progressing ?01/10/2022 1846 by Zadie Rhine, RN ?Outcome: Progressing ?01/10/2022 1846 by Zadie Rhine, RN ?Outcome: Progressing ?  ?Problem: Clinical Measurements: ?Goal: Ability to maintain a body temperature in the normal range will improve ?01/10/2022 1846 by Zadie Rhine, RN ?Outcome: Progressing ?01/10/2022 1846 by Zadie Rhine, RN ?Outcome: Progressing ?  ?

## 2022-01-11 ENCOUNTER — Inpatient Hospital Stay (HOSPITAL_COMMUNITY): Payer: Medicare Other

## 2022-01-11 LAB — AEROBIC/ANAEROBIC CULTURE W GRAM STAIN (SURGICAL/DEEP WOUND): Gram Stain: NONE SEEN

## 2022-01-11 LAB — GLUCOSE, CAPILLARY
Glucose-Capillary: 104 mg/dL — ABNORMAL HIGH (ref 70–99)
Glucose-Capillary: 122 mg/dL — ABNORMAL HIGH (ref 70–99)
Glucose-Capillary: 114 mg/dL — ABNORMAL HIGH (ref 70–99)
Glucose-Capillary: 112 mg/dL — ABNORMAL HIGH (ref 70–99)

## 2022-01-11 LAB — CULTURE, BLOOD (ROUTINE X 2)
Culture: NO GROWTH
Special Requests: ADEQUATE

## 2022-01-11 IMAGING — MR MR CERVICAL SPINE WO/W CM
8 of 9 series · 37 of 48 positions shown · IV contrast (gadavist)
Comparison: None.

CLINICAL DATA: Rule out spinal infection

EXAM:
MRI CERVICAL SPINE WITHOUT AND WITH CONTRAST
TECHNIQUE: Multiplanar and multiecho pulse sequences of the cervical spine, to
include the craniocervical junction and cervicothoracic junction,
were obtained without and with intravenous contrast.
CONTRAST:  7mL GADAVIST GADOBUTROL 1 MMOL/ML IV SOLN

[Series 5: T1 · sagittal · 3.0mm · 0.69mm/px · 4 of 15 slices shown (1 of 2)]
[im 1/15]
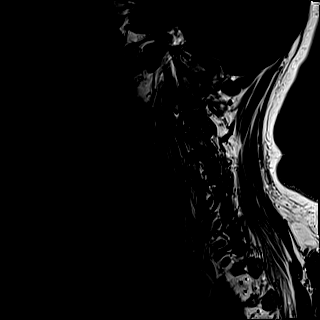
[im 5/15]
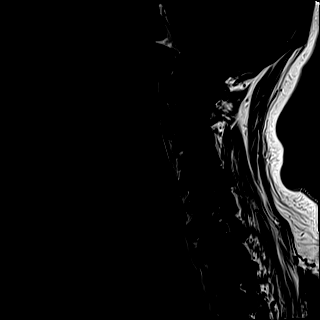
[im 10/15]
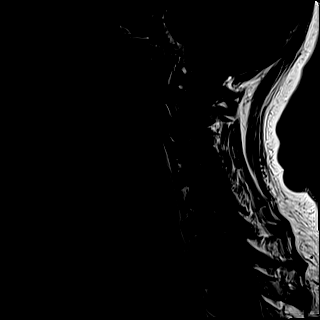
[im 15/15]
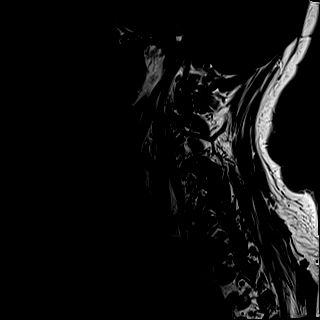

[Series 6: T2 · sagittal · 3.0mm · 0.69mm/px · 3 of 15 slices shown (1 of 3)]
[im 1/15]
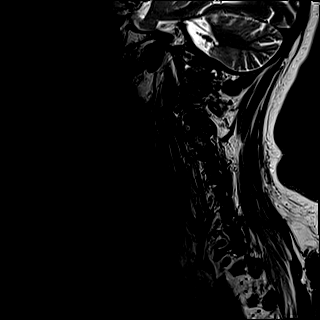
[im 8/15]
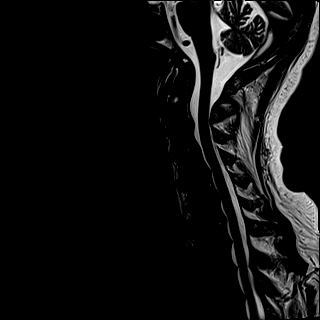
[im 15/15]
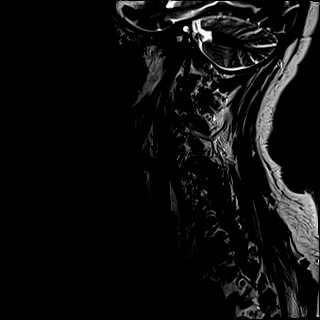

[Series 7: STIR · sagittal · 3.0mm · 0.86mm/px · 3 of 15 slices shown]
[im 1/15]
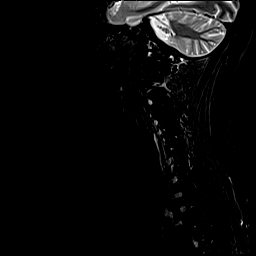
[im 8/15]
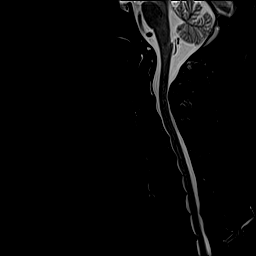
[im 15/15]
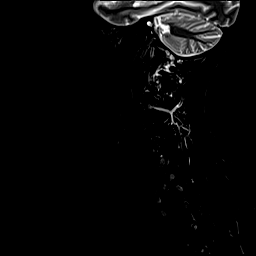

[Series 8: T2 · axial · 3.0mm · 0.70mm/px · z∈[-22,+78]mm · 7 of 32 slices shown (2 of 3)]
[im 1/32]
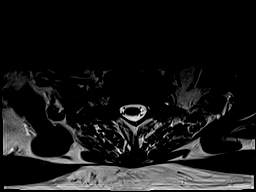
[im 6/32]
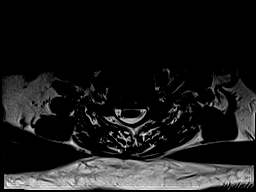
[im 11/32]
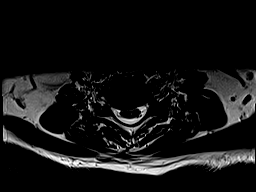
[im 16/32]
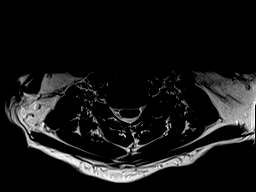
[im 21/32]
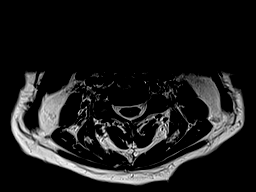
[im 26/32]
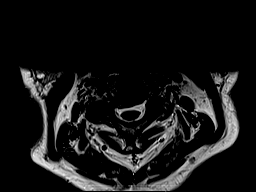
[im 32/32]
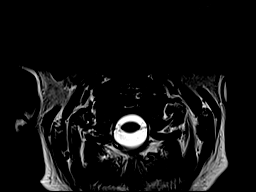

[Series 10: T1 · axial · 3.0mm · 0.35mm/px · z∈[-32,+69]mm · 7 of 32 slices shown (2 of 2)]
[im 1/32]
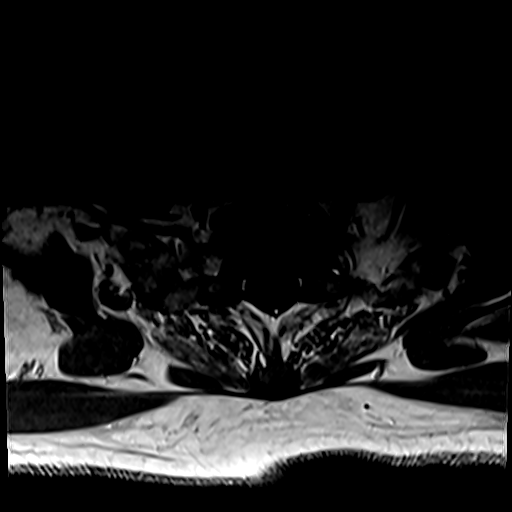
[im 6/32]
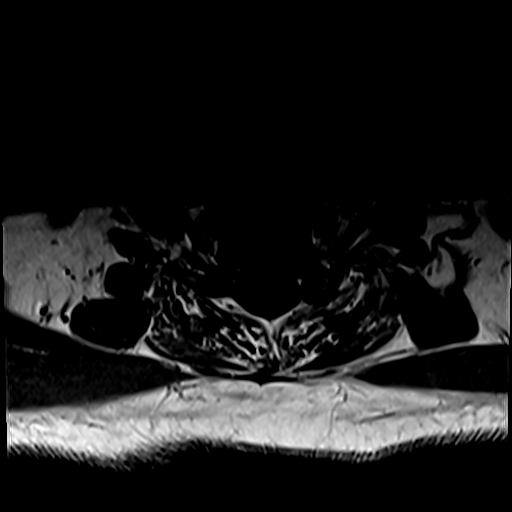
[im 11/32]
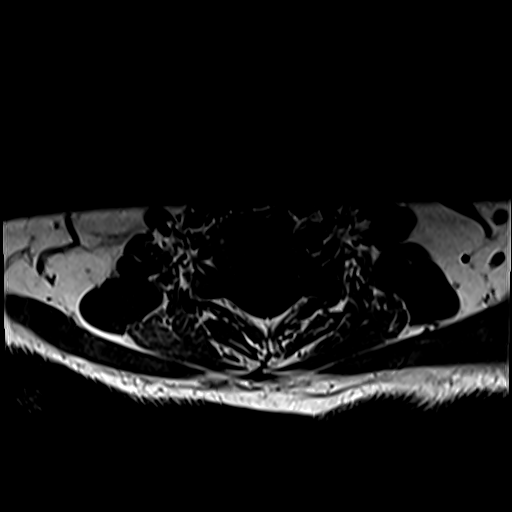
[im 16/32]
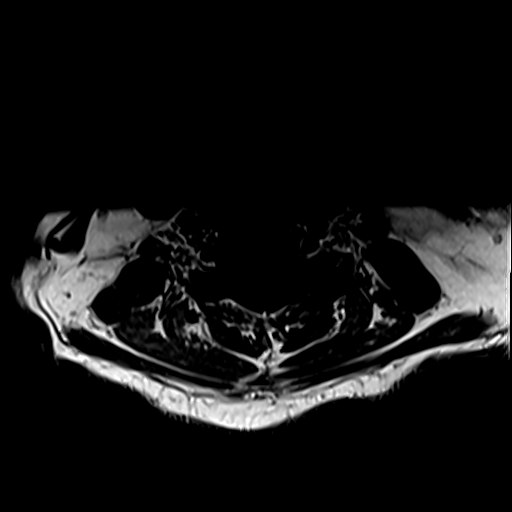
[im 21/32]
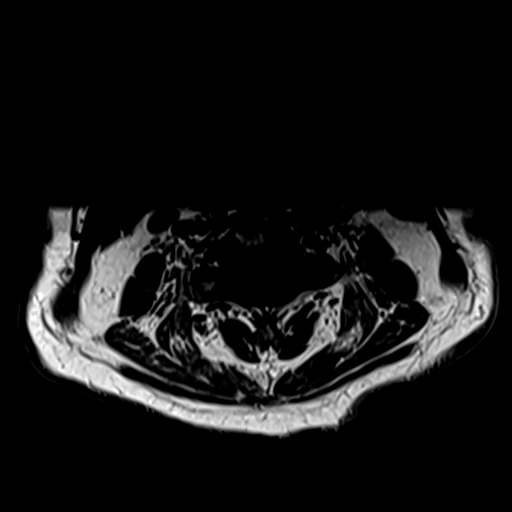
[im 26/32]
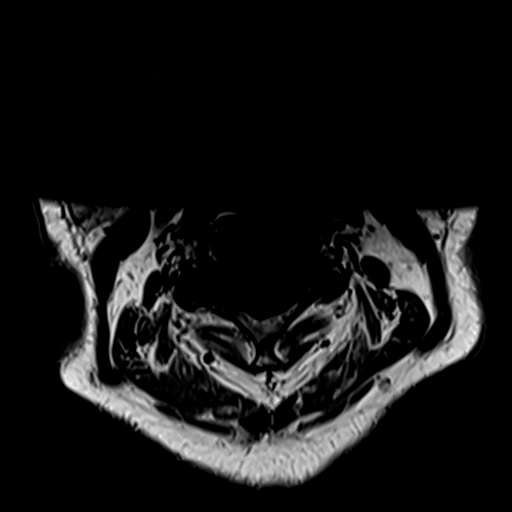
[im 32/32]
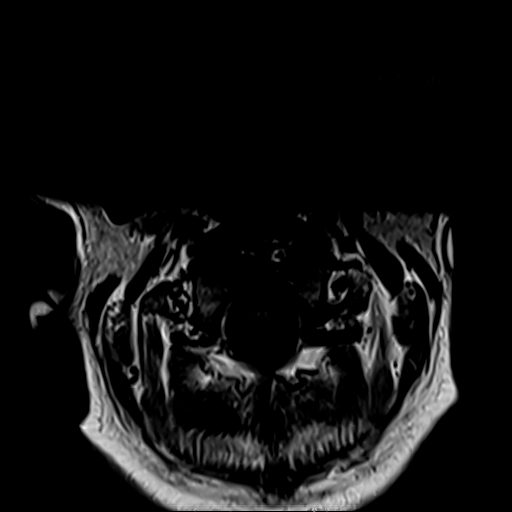

[Series 27: T1 fat-sat post-contrast · sagittal · 3.0mm · 0.69mm/px · 3 of 15 slices shown]
[im 1/15]
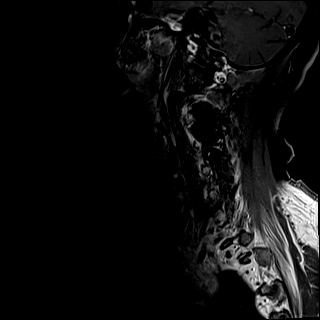
[im 8/15]
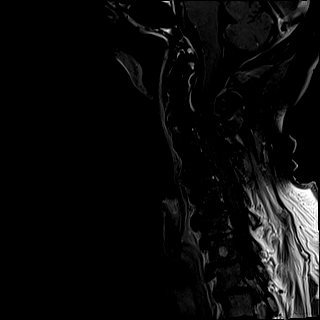
[im 15/15]
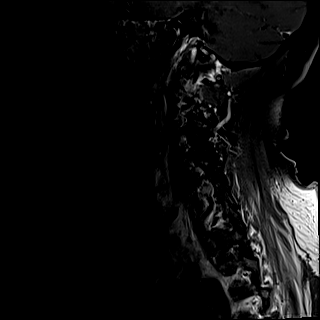

[Series 28: T1 post-contrast · axial · 3.0mm · 0.35mm/px · z∈[-32,+1]mm · 3 of 32 slices shown]
[im 1/32]
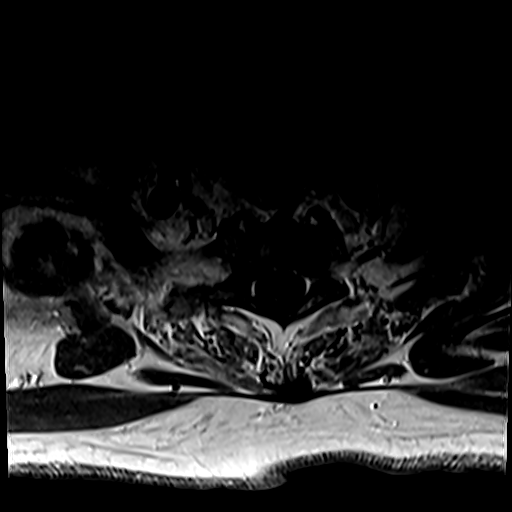
[im 6/32]
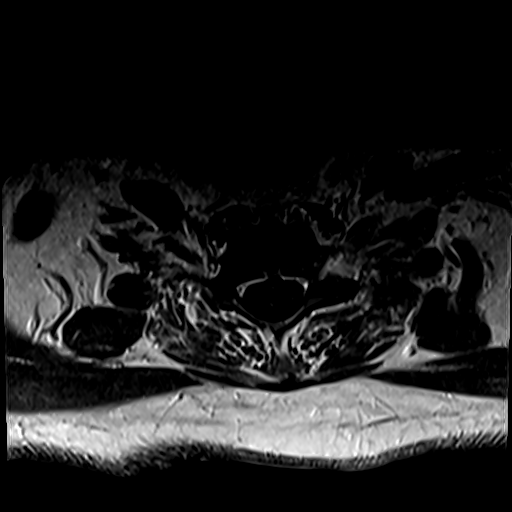
[im 11/32]
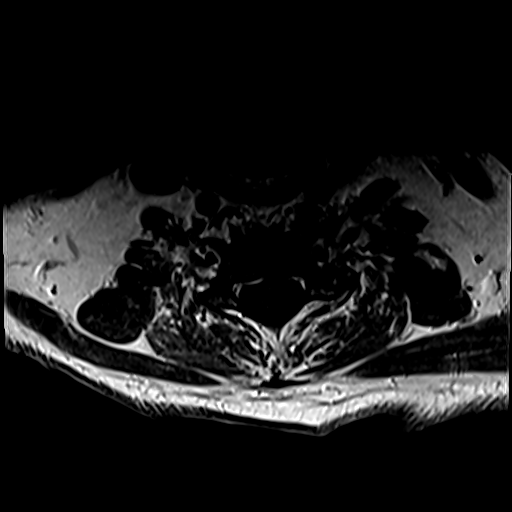

[Series 100: T2 · axial · 3.0mm · 0.70mm/px · z∈[-22,+78]mm · 7 of 32 slices shown (3 of 3)]
[im 1/32]
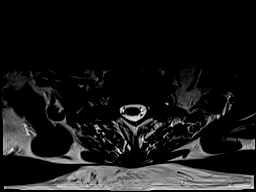
[im 6/32]
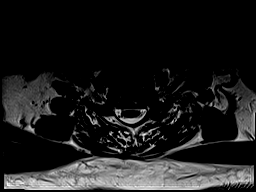
[im 11/32]
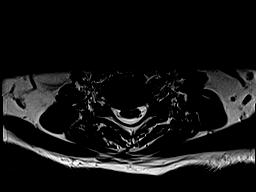
[im 16/32]
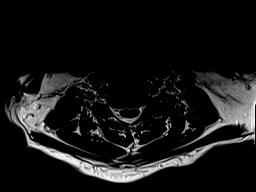
[im 21/32]
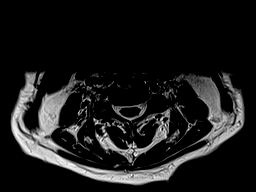
[im 26/32]
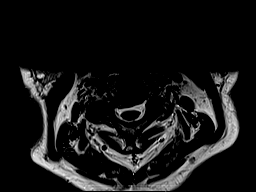
[im 32/32]
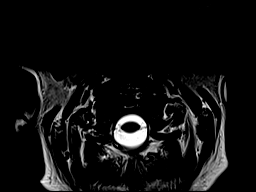

[37 of 48 positions shown; findings below may reference images not displayed]

FINDINGS: Alignment: Normal alignment.  Straightening of the cervical lordosis

Vertebrae: Negative for fracture or mass. No evidence of spinal
infection. Small hemangioma T1 vertebral body.

Cord: Normal signal and morphology.

Posterior Fossa, vertebral arteries, paraspinal tissues: Negative

Disc levels:

C2-3: Disc degeneration and spurring on the right with moderate to
severe right foraminal encroachment. Mild left foraminal encroach

C3-4: Disc degeneration and diffuse uncinate spurring. Moderate
foraminal encroachment bilaterally. Spinal canal adequate in size

C4-5: Disc degeneration and diffuse uncinate spurring. Moderate to
advanced facet degeneration on the left. Severe left foraminal
encroachment. Moderate right foraminal encroachment. Spinal canal
adequate in diameter

C5-6: Disc degeneration with uncinate spurring. Mild foraminal
narrowing bilaterally

C6-7: Disc degeneration and diffuse uncinate spurring. Moderate to
severe left foraminal encroachment. Mild right foraminal
encroachment. Spinal canal adequate in diameter

C7-T1: Disc degeneration and diffuse uncinate spurring. Moderate
foraminal encroachment bilaterally.
IMPRESSION: Negative for cervical spine infection.  No acute abnormality

Cervical spondylosis with bilateral foraminal encroachment at
multiple levels due to spurring.

## 2022-01-11 IMAGING — MR MR LUMBAR SPINE WO/W CM
8 series · 42 of 48 positions shown · IV contrast (8 ML GADAVIST)
Comparison: None.

CLINICAL DATA: Rule out spinal infection. Rule out osteomyelitis or
abscess

EXAM:
MRI LUMBAR SPINE WITHOUT AND WITH CONTRAST
TECHNIQUE: Multiplanar and multiecho pulse sequences of the lumbar spine were
obtained without and with intravenous contrast.
CONTRAST:  7mL GADAVIST GADOBUTROL 1 MMOL/ML IV SOLN

[Series 108: T1 · sagittal · 4.0mm · 0.88mm/px · 5 of 17 slices shown (1 of 2)]
[im 1/17]
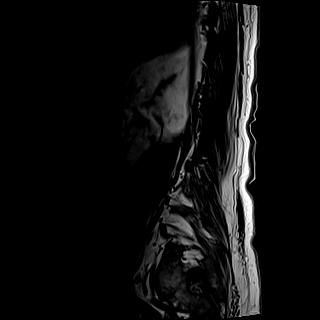
[im 5/17]
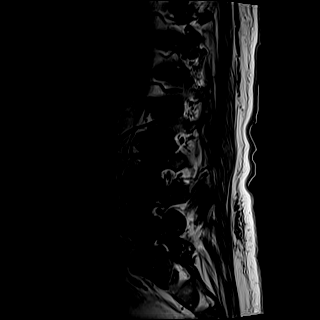
[im 9/17]
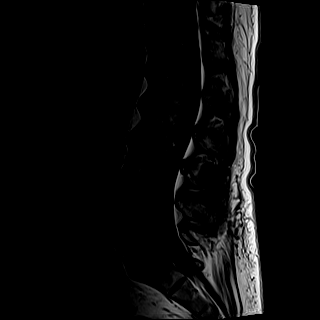
[im 13/17]
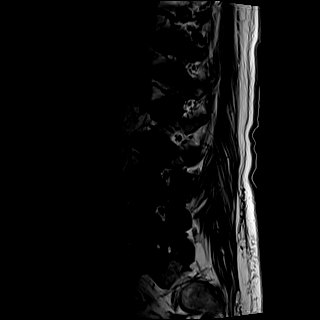
[im 17/17]
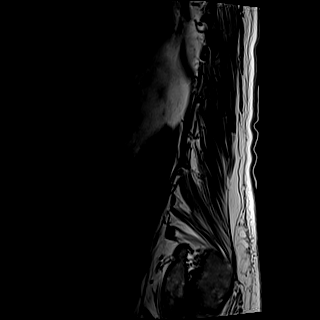

[Series 109: T2 · sagittal · 4.0mm · 0.88mm/px · 4 of 17 slices shown (1 of 2)]
[im 1/17]
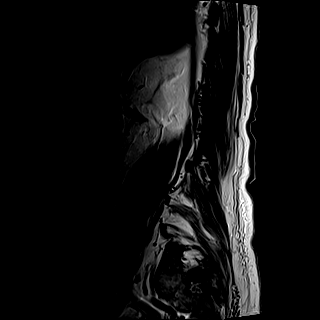
[im 6/17]
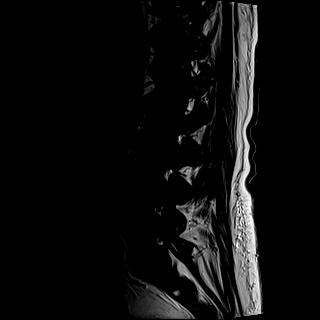
[im 11/17]
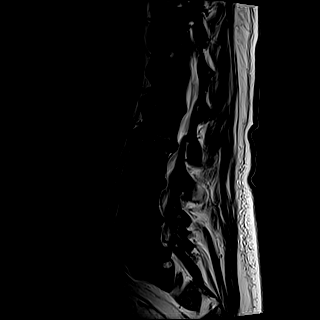
[im 17/17]
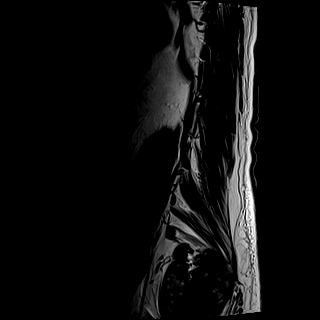

[Series 110: STIR · sagittal · 4.0mm · 0.55mm/px · 4 of 17 slices shown]
[im 1/17]
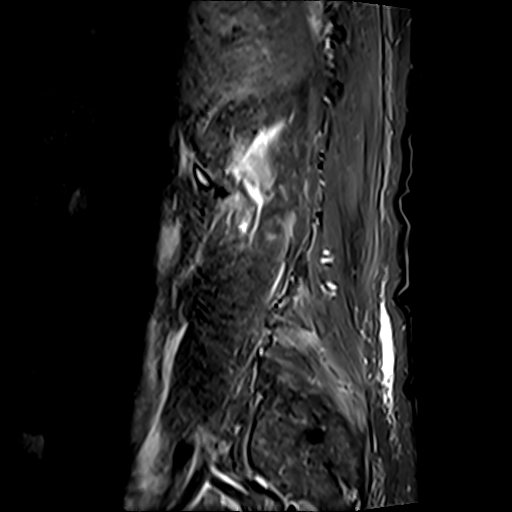
[im 6/17]
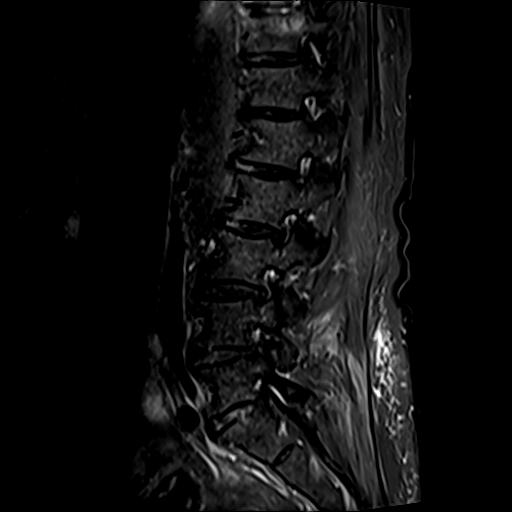
[im 11/17]
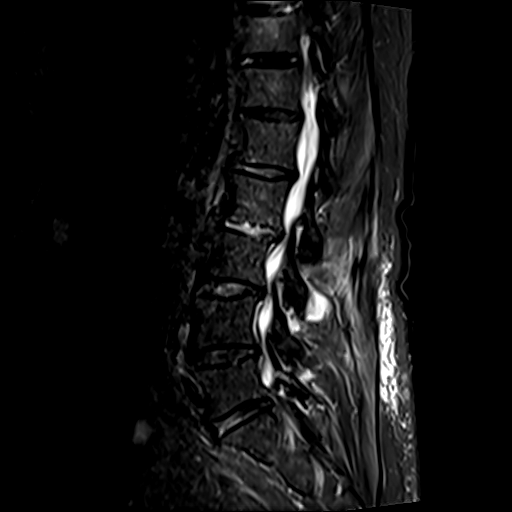
[im 17/17]
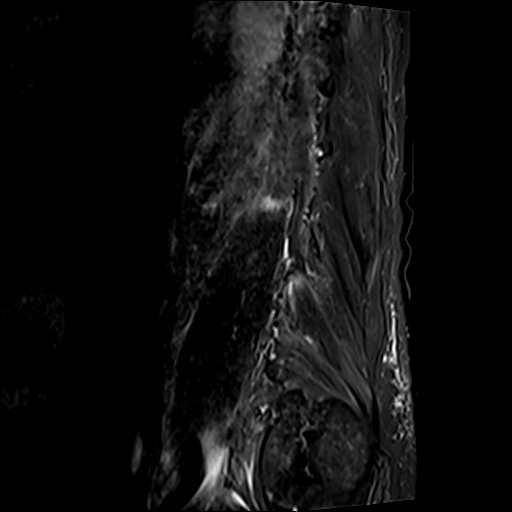

[Series 111: T2 · axial · 4.0mm · 0.62mm/px · z∈[-442,-233]mm · 9 of 39 slices shown (2 of 2)]
[im 1/39]
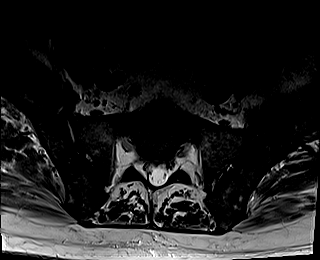
[im 5/39]
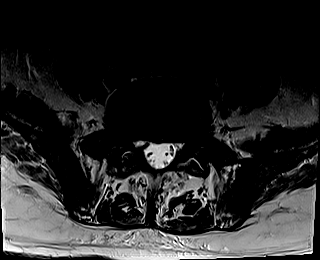
[im 10/39]
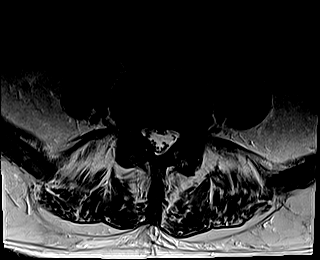
[im 15/39]
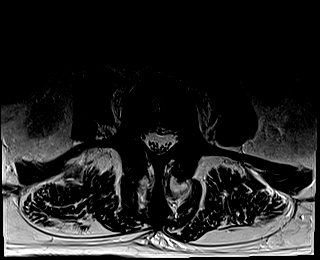
[im 20/39]
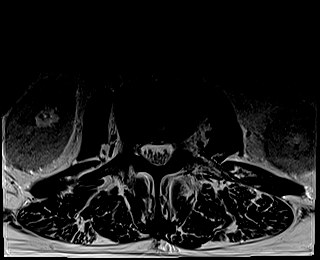
[im 24/39]
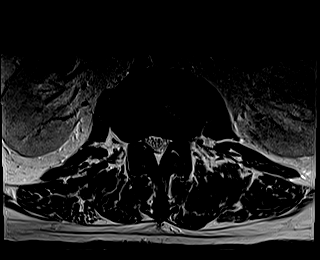
[im 29/39]
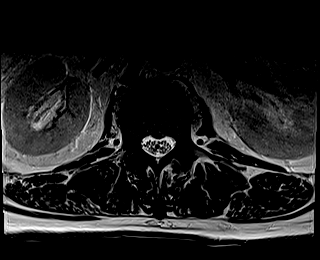
[im 34/39]
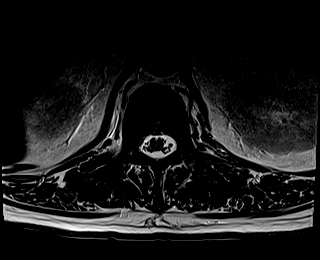
[im 39/39]
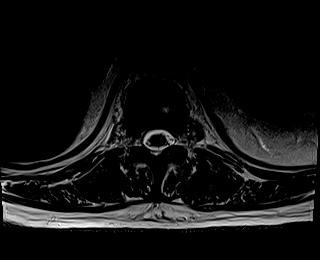

[Series 112: T1 · axial · 4.0mm · 0.62mm/px · z∈[-442,-233]mm · 8 of 39 slices shown (2 of 2)]
[im 1/39]
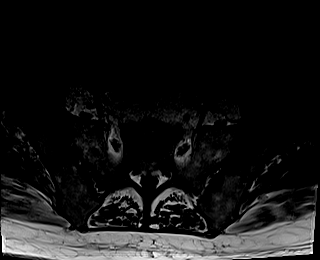
[im 5/39]
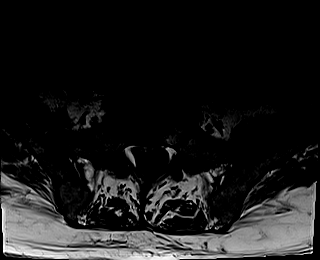
[im 10/39]
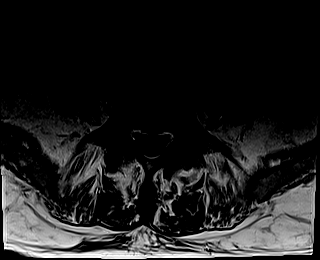
[im 15/39]
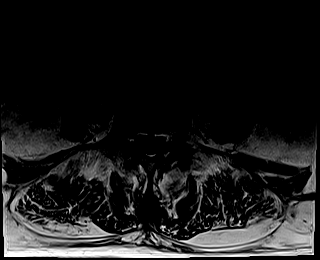
[im 24/39]
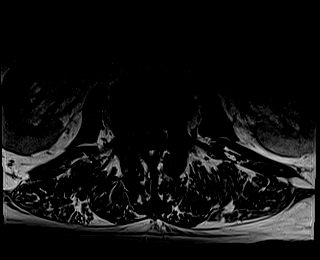
[im 29/39]
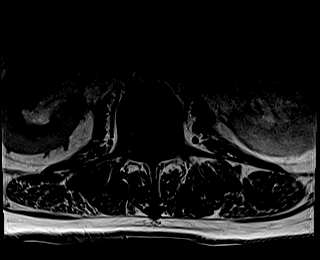
[im 34/39]
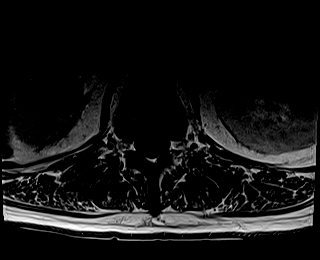
[im 39/39]
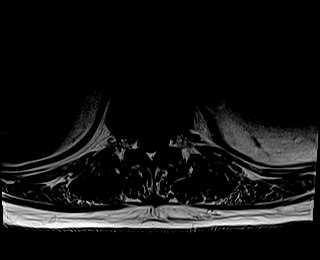

[Series 113: T2 post-contrast · sagittal · 4.0mm · 1.09mm/px · 4 of 17 slices shown]
[im 1/17]
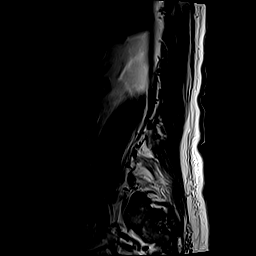
[im 6/17]
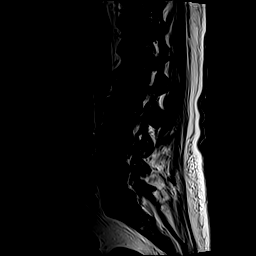
[im 11/17]
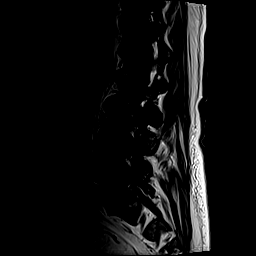
[im 17/17]
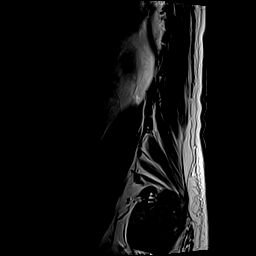

[Series 114: T1 fat-sat post-contrast · sagittal · 4.0mm · 0.88mm/px · 4 of 17 slices shown]
[im 1/17]
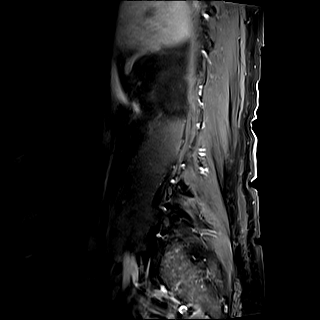
[im 6/17]
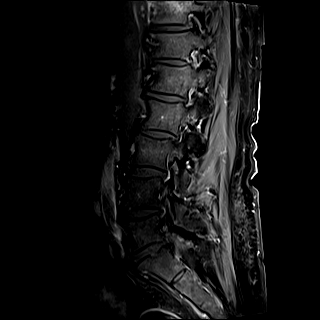
[im 11/17]
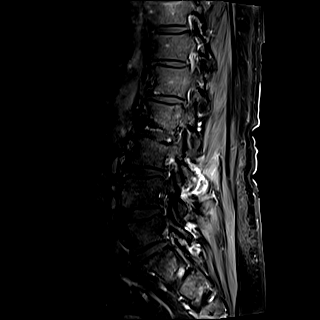
[im 17/17]
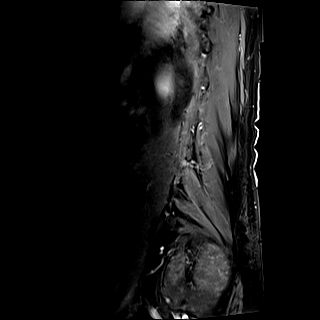

[Series 115: T1 post-contrast · axial · 4.0mm · 0.62mm/px · z∈[-442,-372]mm · 4 of 39 slices shown]
[im 1/39]
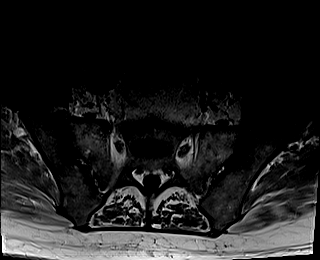
[im 5/39]
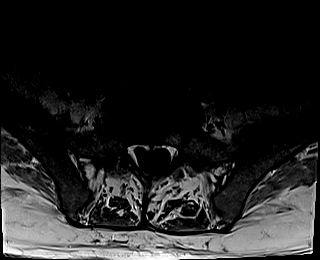
[im 10/39]
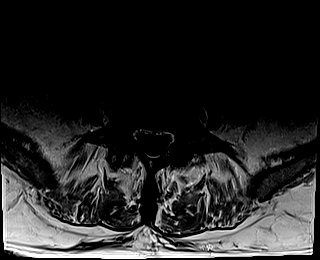
[im 15/39]
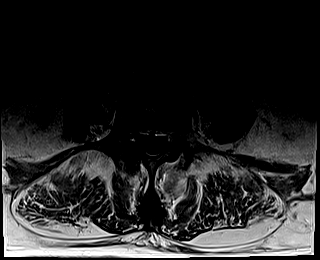

[42 of 48 positions shown; findings below may reference images not displayed]

FINDINGS: Segmentation:  5 lumbar vertebra

Alignment: Negative for stenosis. Mild anterolisthesis T12-L1. Mild
retrolisthesis L1-2, L2-3, L3-4

Vertebrae: Negative for fracture or mass. Hemangioma L4 vertebral
body. No evidence of lumbar spine infection.

Ventral epidural fluid collection is present posterior to the T11
vertebral body. This shows peripheral enhancement postcontrast
infusion. Mild enhancement in the T11 vertebral body. This is
incompletely visualized and there are no axial images through this
area however given history this is likely an epidural abscess.

Conus medullaris and cauda equina: Conus extends to the L2 level.
Conus and cauda equina appear normal.

Paraspinal and other soft tissues: Negative for paraspinous mass,
adenopathy, or soft tissue fluid collection.

Disc levels:

T12-L1: Disc and facet degeneration.

L1-2: Small central disc protrusion. Disc degeneration and disc
bulging. Negative for stenosis

L2-3: Disc degeneration with diffuse endplate spurring left greater
than right. Mild spinal stenosis. Moderate subarticular stenosis on
the left. Mild right subarticular stenosis

L3-4: Disc degeneration and disc bulging and mild spurring.
Bilateral facet degeneration. Mild subarticular stenosis bilaterally
right greater than left

L4-5: Small left paracentral disc protrusion. Moderate facet
degeneration. Mild spinal stenosis. Mild to moderate subarticular
stenosis bilaterally

L5-S1: Mild disc degeneration and spurring. Bilateral facet
degeneration. Mild subarticular stenosis bilaterally.
IMPRESSION: 1. There is a ventral epidural fluid collection with peripheral
enhancement posterior to the T11 vertebral body. There is mild
enhancement of the T11 vertebral body. Findings are highly
suspicious for osteomyelitis and epidural abscess. Recommend MRI
thoracic spine without and with contrast for further evaluation
2. Negative for lumbar spine infection
3. Multilevel degenerative change in the lumbar spine as detailed
above.

## 2022-01-11 MED ORDER — GADOBUTROL 1 MMOL/ML IV SOLN
7.0000 mL | Freq: Once | INTRAVENOUS | Status: AC | PRN
  Administered 2022-01-11: 7 mL via INTRAVENOUS

## 2022-01-11 NOTE — Progress Notes (Signed)
Orthopedic Tech Progress Note ?Patient Details:  ?Vincent White. ?1951/08/24 ?[id number] ? ?Patient ID: Demarcus Thielke., male   DOB: 1950/11/24, 71 y.o.   MRN: [id number] ? ?Kennis Carina ?01/11/2022, 2:30 PM ?Post op shoe applied to right foot ?

## 2022-01-11 NOTE — Progress Notes (Signed)
?PROGRESS NOTE ? ? ? ?Vincent White.  HBZ:[id number] DOB: Feb 03, 1951 DOA: 01/04/2022 ?PCP: Leighton Ruff, MD (Inactive)  ? ? ?Brief Narrative:  ?Vincent White. is a 71 year old male with past medical history significant for essential pretension, hyperlipidemia, cervical spondylosis, gout, vitamin D deficiency, mitral valve insufficiency, pulmonary HTN, type 2 diabetes mellitus, OSA on CPAP who presented to [name] ED on 4/6 via EMS with generalized weakness, fatigue, poor oral intake and inability to ambulate over the last 2-3 weeks.  Patient also complaining of increased knee pain thought to be secondary to an acute gout flare in which he was drinking "tart cherry juice"; as he heard this may help with gout flares.  Patient has not been eating or drinking much over the last week as well other than hard apple cider 6-12 cans daily.  Also reports right second toe wound following cutting his toenails.  He denies fever, no chills, no sore throat, no shortness of breath, no cough, no chest pain, no palpitations, no diaphoresis, no urinary symptoms, no visual changes. ? ?In the ED, temperature 99.4, HR 108, RR 31, BP 124/78, SPO2 94% on room air.  Sodium 118, potassium 4.8, chloride 81, CO2 21, glucose 190, BUN 63, creatinine 2.20, alkaline phosphatase 127, AST 67, ALT 70, total bilirubin 1.2.  Lactic acid 3.2.  WBC 31.0, hemoglobin 14.3, platelets 541.  COVID-19 PCR negative.  Influenza A/B PCR negative.  Urinalysis with large hemoglobin, trace leukocytes, negative nitrite, rare bacteria, greater than 11-20 WBCs.  Chest x-ray with hypoventilation, elevated right hemidiaphragm with right lower lobe airspace disease consistent with atelectasis.  Patient was given acetaminophen 1 g p.o. x1, azithromycin 5 mg IV, ceftriaxone 1 g IV, oxycodone 5 mg p.o. and 1 L NS bolus.  Hospital service consulted for further evaluation and management of sepsis. ? ?01/06/2022: Patient seen.  Available records reviewed.  Patient has undergone  amputation of the right second toe.  Likely, patient has previously undocumented liver cirrhosis.  We will pursue an abdominal ultrasound.  In the setting of possible acute kidney injury, will also start patient on midodrine and IV albumin.  We discontinued IV fluids.  Infectious disease input is appreciated.  We will check acute hepatitis panel and cryo.  Patient seems volume overloaded.  Will discontinue IV fluid.  Overall, patient is very sick with guarded prognosis. ? ?01/07/2022: Patient seen alongside patient's wife.  Patient looks better today.  Patient looks less edematous today.  We will continue IV albumin.  Abdominal ultrasound revealed well distended gallbladder with gallbladder sludge.  No cholelithiasis was reported.  Patient is documented to have cirrhosis.  Blood and urine cultures have grown MSSA.  Communicated with infectious disease physician, Dr.Comer, patient will need TEE.  TTE is nonrevealing.  Normal EF.  Diastolic function could not be determined. ? ?01/08/2022: Patient seen alongside patient's months.  Patient continues to look better.  Patient reports back pain.  We used lidocaine patch and, increased oxycodone from 5 Mg p.o. every 4 hourly to 10 Mg p.o. every 4 hourly as needed.  Acute kidney injury has resolved.  Cardiology team has been consulted for TEE.  Continue antibiotics.  Edema has improved. ? ?01/09/22: Patient seen and evaluated. Improving slowly, somewhat depressed, does relay SI in the past.  ?Infectious disease pointed out small fluid collection around the lateral aspect of the base of the fifth metatarsal on the left that is 1.6 x 0.7 cm in dimensions , will request Podiatry evaluation ?further debridement. ?4/12- ?Psych-started cymbalta,  no acute concern for SI. ?ID- recommended repeat Bcx, MRI C/L spine, I talked with patient we will obtain as soon as possible  ?Podiatric Surgeon- bedside I&D, successful conducted 4/12. ?Hosp- appreciate specialists assistance, will formalize  plan for dc based on needs. Likely 48h plan for DC. ? ?4/13 ?Continue Abx, unfortunately MRI did not get done yesterday, backedup. Hopefully before tonight. Plan for dc within 24 hours ? ?Assessment & Plan: ? ?Severe sepsis, POA ?MSSA septicemia, POA ?Right second toe diabetic wound/osteomyelitis ?Patient presenting to the ED with progressive weakness, fatigue, decreased ambulation and poor oral intake.  Patient was febrile up to 101.9, tachycardic, tachypneic with elevated lactic acid of 3.2 on admission.  WBC count elevated 31.0.  Chest x-ray with right lower lobe airspace disease consistent with atelectasis versus pneumonia.  Right second toe x-ray with skin and soft tissue irregularity around the distal digit, indistinct cortex of the distal tuft of the second distal phalanx suspicious for osteomyelitis. ?--Infectious disease and podiatry consulted ?--WBC 31.0>26.7 ?--Blood cultures x2 4/6: BCID + MSSA; awaiting further identification/susceptibilities ?--TTE: Pending ?--Cefazolin 2 g IV every 8 hours ?--CBC daily. ?--Supportive care, pain control, IV fluid hydration ?01/06/2022: Infectious disease and podiatry input is appreciated.  Patient has undergone amputation of the right second toe.  The rash on the right lower leg may be related to the patient's undiagnosed likely liver disease/cirrhosis.  Viral hepatitis status is unknown.  We will check acute hepatitis panel.  Check cryoglobulin level.  Right upper quadrant ultrasound to confirm if patient has cirrhosis.  Cirrhosis could be secondary to the alcohol on NASH or combined effect of both.  Guarded prognosis.  Antibiotics as per infectious disease team. ?01/07/2022: See above documentation.  Blood and urine cultures have grown MSSA.  Patient will need TEE.  We will consult cardiology team.  Patient is currently on IV cefazolin.  Infectious diseases directing care. ?01/08/2022: Continue antibiotics.  Leukocytosis is improving.  Overall, patient looks better.   Sepsis physiology has resolved.  Will discontinue midodrine. ?4/11: small fluid collection around the lateral aspect of the base of the fifth metatarsal on the left that is 1.6 x 0.7 cm in dimensions , requesting eval by Podiatry team. Cont abx.  ?4/12 fluid collection drained bedsided by Podiatry ?ID repeat Bcx, MRI C/L spine ordered given pain complaints by patient ?Repeat Bcx NGTD, MRI C/L spine pending. ? ?Hyponatremia ?Sodium 118 on admission.  Etiology likely secondary to EtOH abuse complicated by severe sepsis as above and likely dehydration from poor oral intake in the days preceding hospitalization.  Urine osmolality 316, urine sodium less than 10. ?--Na 118>>122 ?--Continue IV fluid hydration w/ NA at 142m/hr ?--Na level q12h ?-- Monitor on telemetry ?01/08/2022: Hyponatremia is likely multifactorial.  Suspect secondary to combined alcohol abuse, liver disease, ineffective circulatory arterial volume and cannot rule out SIADH.  May also have prognostic significance.  Sodium is slowly improving.  Sodium is 134 today.   ? ?Lower extremity edema ?Etiology likely secondary to acute infectious process.  Previous TTE with LVEF 614-78% normal diastolic parameters on 62/95/6213 ?--Vascular duplex ultrasound bilateral lower extremities to rule out DVT ?--TTE pending as above ?--Lower extremity elevation ?01/08/2022: Improved significantly.  Patient has been on IV albumin.   ? ?Acute renal failure ?Creatinine 2.20 on admission.  Etiology likely secondary to prerenal azotemia in the setting of dehydration and poor oral intake in the days preceding hospitalization versus ATN from underlying severe sepsis as above with septicemia.  Additionally, patient with  acute urinary retention likely contributing as well. ?--Cr 2.20>>1.81 ?--Holding home losartan/metformin ?--IV fluid hydration ?--Foley catheter ordered/7 ?--Strict I's and O's ?--Avoid nephrotoxins, renally dose all medications ?--BMP daily ?01/06/2022: Hopefully,  will not delay with hepatorenal syndrome.  Start IV albumin and midodrine. ?01/07/2022: Slowly improving.  Possibly secondary to ATN/ineffective arterial circulatory volume.  Continue IV albumin and midodrine. ?

## 2022-01-11 NOTE — Progress Notes (Signed)
Physical Therapy Treatment ?Patient Details ?Name: Vincent White. ?MRN: [id number] ?DOB: 12/15/1950 ?Today's Date: 01/11/2022 ? ? ?History of Present Illness Patient is 71 y.o. male s/p Rt second toe amputation on 01/06/22 due to osteomyelitis. He had presented for weakness, fatigue, and difficulty ambulating due to knee pain from gout flare up and Rt toe wound. PMH significant for gout, HLD, HTN, OSA, cervical spondylosis. ? ?  ?PT Comments  ? ? Pt NOT progressing with his mobility.  Today he was unable to stand.  Assisted to EOB was difficult.  General bed mobility comments: pt required Max/Total Assist + 2 to perform all bed mobility.  Very limited B UE functional activity due to edema.  Unable to grip bed rail.  Difficulty with self rolling.  Once EOB, pt was able to static sit x 8 min at Supervision level.  MAX c/o weakness/fatigue.  Unable to hold a cup of water.  Assisted back to bed + 2 Total Assist and positioned to comfort. General transfer comment: attempted sit to stand + 2 side by side however pt was unable to assist no more than 10% due to weakness/fatigue and unable to feel "the bottom half of my body".  B hips and knees buckled with attempt.  Total Assist to prevent fall to floor, assisted back to EOB.  No reports of pain due to paracesis.  Rec Maxi Move LIFT for any OOB transfer. ?Will update LPT.  Pt may need ST Rehab at SNF prior to returning home with working spouse.   ?Recommendations for follow up therapy are one component of a multi-disciplinary discharge planning process, led by the attending physician.  Recommendations may be updated based on patient status, additional functional criteria and insurance authorization. ? ?Follow Up Recommendations ? Skilled nursing-short term rehab (<3 hours/day) ?  ?  ?Assistance Recommended at Discharge Frequent or constant Supervision/Assistance  ?Patient can return home with the following Two people to help with walking and/or transfers;Two people to help with  bathing/dressing/bathroom;Assistance with cooking/housework;Direct supervision/assist for medications management;Assist for transportation;Help with stairs or ramp for entrance ?  ?Equipment Recommendations ? None recommended by PT  ?  ?Recommendations for Other Services   ? ? ?  ?Precautions / Restrictions Precautions ?Precautions: Fall ?Restrictions ?Weight Bearing Restrictions: Yes ?RLE Weight Bearing: Weight bearing as tolerated ?Other Position/Activity Restrictions: per Podiatry PN note 4/12 "WBAT in [place]"  ?  ? ?Mobility ? Bed Mobility ?Overal bed mobility: Needs Assistance ?Bed Mobility: Supine to Sit, Sit to Supine ?  ?  ?Supine to sit: Max assist, HOB elevated, +2 for safety/equipment, +2 for physical assistance ?Sit to supine: Total assist ?  ?General bed mobility comments: pt required Max/Total Assist + 2 to perform all bed mobility.  Very limited B UE functional activity due to edema.  Unable to grip bed rail.  Difficulty with self rolling.  Once EOB, pt was able to static sit x 8 min at Supervision level.  MAX c/o weakness/fatigue.  Unable to hold a cup of water.  Assisted back to bed + 2 Total Assist and positioned to comfort. ?  ? ?Transfers ?  ?  ?  ?  ?  ?  ?  ?  ?  ?General transfer comment: attempted sit to stand + 2 side by side however pt was unable to assist no more than 10% due to weakness/fatigue and unable to feel "the bottom half of my body".  B hips and knees buckled with attempt.  Total Assist to  prevent fall to floor, assisted back to EOB.  No reports of pain due to paracesis.  Rec Maxi Move LIFT for any OOB transfer. ?  ? ?Ambulation/Gait ?  ?  ?  ?  ?  ?  ?  ?  ? ? ?Stairs ?  ?  ?  ?  ?  ? ? ?Wheelchair Mobility ?  ? ?Modified Rankin (Stroke Patients Only) ?  ? ? ?  ?Balance   ?  ?  ?  ?  ?  ?  ?  ?  ?  ?  ?  ?  ?  ?  ?  ?  ?  ?  ?  ? ?  ?Cognition   ?Behavior During Therapy: [facility] for tasks assessed/performed ?Overall Cognitive Status: Within Functional Limits for tasks  assessed ?  ?  ?  ?  ?  ?  ?  ?  ?  ?  ?  ?  ?  ?  ?  ?  ?General Comments: AxO x 3 very pleasant man retired from [employer] 43 years.  Feeling "poorly", "really weak".  Appears edemous throughout esp B UE's.  Unable to hold a cup. ?  ?  ? ?  ?Exercises   ? ?  ?General Comments   ?  ?  ? ?Pertinent Vitals/Pain Pain Assessment ?Pain Assessment: No/denies pain ?Pain Location: "I can't feel by legs"  ? ? ?Home Living   ?  ?  ?  ?  ?  ?  ?  ?  ?  ?   ?  ?Prior Function    ?  ?  ?   ? ?PT Goals (current goals can now be found in the care plan section) Progress towards PT goals: Progressing toward goals ? ?  ?Frequency ? ? ? Min 3X/week ? ? ? ?  ?PT Plan Current plan remains appropriate  ? ? ?Co-evaluation   ?  ?  ?  ?  ? ?  ?AM-PAC PT "6 Clicks" Mobility   ?Outcome Measure ? Help needed turning from your back to your side while in a flat bed without using bedrails?: Total ?Help needed moving from lying on your back to sitting on the side of a flat bed without using bedrails?: Total ?Help needed moving to and from a bed to a chair (including a wheelchair)?: Total ?Help needed standing up from a chair using your arms (e.g., wheelchair or bedside chair)?: Total ?Help needed to walk in hospital room?: Total ?Help needed climbing 3-5 steps with a railing? : Total ?6 Click Score: 6 ? ?  ?End of Session Equipment Utilized During Treatment: Gait belt ?Activity Tolerance: Patient limited by pain ?Patient left: in bed;with call bell/phone within reach ?Nurse Communication: Mobility status ?PT Visit Diagnosis: Other abnormalities of gait and mobility (R26.89);Muscle weakness (generalized) (M62.81);Difficulty in walking, not elsewhere classified (R26.2);Pain ?  ? ? ?Time: 0354-6568 ?PT Time Calculation (min) (ACUTE ONLY): 29 min ? ?Charges:  $Therapeutic Activity: 23-37 mins          ?          ? ?{Aleysia Oltmann  PTA ?Acute  Rehabilitation Services ?Pager      [phone] ?Office      [phone] ? ?

## 2022-01-11 NOTE — Plan of Care (Signed)

## 2022-01-11 NOTE — Progress Notes (Signed)
Daughter called regarding update on pt.  Daughter concerned about pt mental health and progressive weakness and lack of will from patient.  Given update on pt condition.  Requested info regarding MRI.  Unable to give detailed information.  Daughter encouraged to speak with provider in AM for future plans regarding MRI and possible long term abx per daughters information from provider.   ?

## 2022-01-12 ENCOUNTER — Inpatient Hospital Stay (HOSPITAL_COMMUNITY): Payer: Medicare Other

## 2022-01-12 DIAGNOSIS — E785 Hyperlipidemia, unspecified: Secondary | ICD-10-CM

## 2022-01-12 DIAGNOSIS — G4733 Obstructive sleep apnea (adult) (pediatric): Secondary | ICD-10-CM

## 2022-01-12 DIAGNOSIS — N179 Acute kidney failure, unspecified: Secondary | ICD-10-CM

## 2022-01-12 DIAGNOSIS — M00072 Staphylococcal arthritis, left ankle and foot: Secondary | ICD-10-CM

## 2022-01-12 DIAGNOSIS — J189 Pneumonia, unspecified organism: Secondary | ICD-10-CM

## 2022-01-12 DIAGNOSIS — E1165 Type 2 diabetes mellitus with hyperglycemia: Secondary | ICD-10-CM

## 2022-01-12 DIAGNOSIS — S99921S Unspecified injury of right foot, sequela: Secondary | ICD-10-CM

## 2022-01-12 DIAGNOSIS — R7401 Elevation of levels of liver transaminase levels: Secondary | ICD-10-CM

## 2022-01-12 DIAGNOSIS — F331 Major depressive disorder, recurrent, moderate: Secondary | ICD-10-CM

## 2022-01-12 DIAGNOSIS — B9561 Methicillin susceptible Staphylococcus aureus infection as the cause of diseases classified elsewhere: Secondary | ICD-10-CM

## 2022-01-12 DIAGNOSIS — E871 Hypo-osmolality and hyponatremia: Secondary | ICD-10-CM | POA: Diagnosis not present

## 2022-01-12 DIAGNOSIS — R7881 Bacteremia: Secondary | ICD-10-CM

## 2022-01-12 DIAGNOSIS — M869 Osteomyelitis, unspecified: Secondary | ICD-10-CM

## 2022-01-12 DIAGNOSIS — I10 Essential (primary) hypertension: Secondary | ICD-10-CM

## 2022-01-12 LAB — PROTIME-INR
INR: 4.9 (ref 0.8–1.2)
Prothrombin Time: 45.6 seconds — ABNORMAL HIGH (ref 11.4–15.2)

## 2022-01-12 LAB — GLUCOSE, CAPILLARY
Glucose-Capillary: 109 mg/dL — ABNORMAL HIGH (ref 70–99)
Glucose-Capillary: 173 mg/dL — ABNORMAL HIGH (ref 70–99)
Glucose-Capillary: 108 mg/dL — ABNORMAL HIGH (ref 70–99)
Glucose-Capillary: 143 mg/dL — ABNORMAL HIGH (ref 70–99)

## 2022-01-12 LAB — CRYOGLOBULIN

## 2022-01-12 IMAGING — MR MR THORACIC SPINE WO/W CM
8 of 10 series · 30 of 48 positions shown · IV contrast (gadavist)
Comparison: None.

CLINICAL DATA: Thoracic osteomyelitis.

EXAM:
MRI THORACIC WITHOUT AND WITH CONTRAST
TECHNIQUE: Multiplanar and multiecho pulse sequences of the thoracic spine were
obtained without and with intravenous contrast.
CONTRAST:  8mL GADAVIST GADOBUTROL 1 MMOL/ML IV SOLN

[Series 16: T1 · sagittal · 4.0mm · 1.72mm/px · 1 of 5 slices shown (1 of 3)]
[im 1/5]
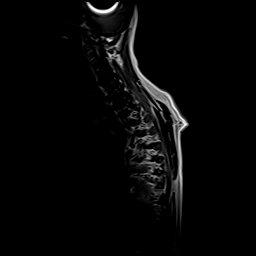

[Series 17: STIR · sagittal · 3.0mm · 1.00mm/px · 3 of 15 slices shown]
[im 1/15]
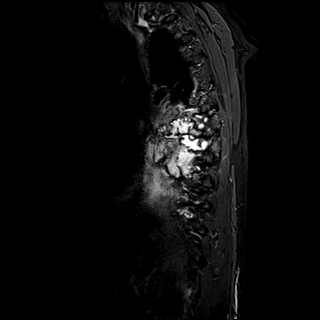
[im 8/15]
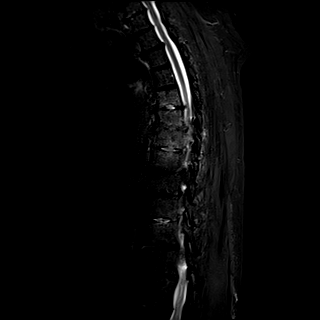
[im 15/15]
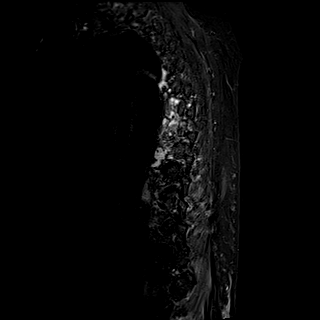

[Series 18: T1 · sagittal · 3.0mm · 1.00mm/px · 3 of 15 slices shown (2 of 3)]
[im 1/15]
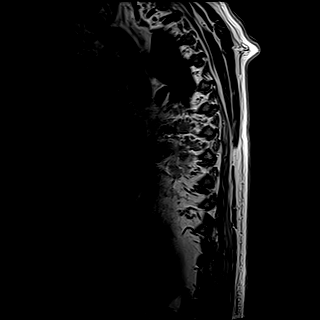
[im 8/15]
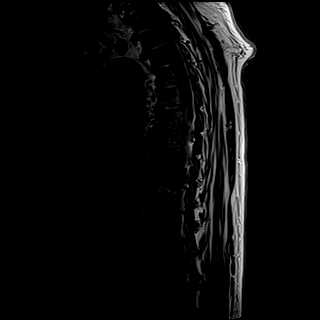
[im 15/15]
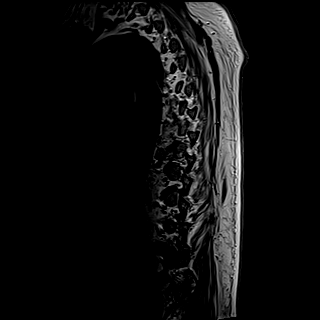

[Series 19: T2 · axial · 4.0mm · 0.78mm/px · z∈[-317,-37]mm · 8 of 44 slices shown]
[im 1/44]
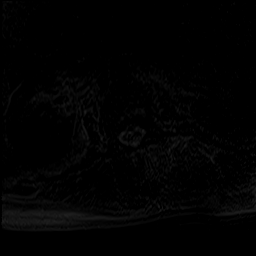
[im 7/44]
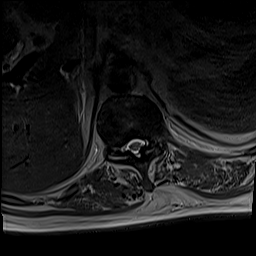
[im 13/44]
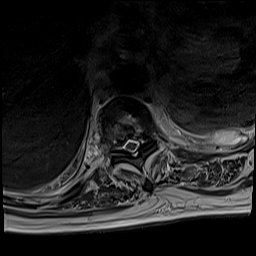
[im 19/44]
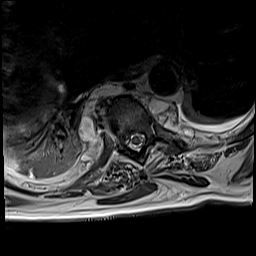
[im 25/44]
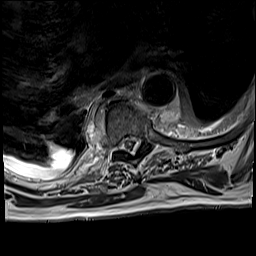
[im 31/44]
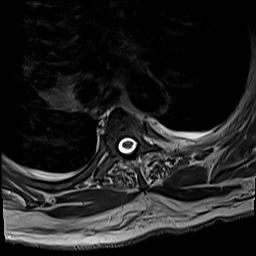
[im 37/44]
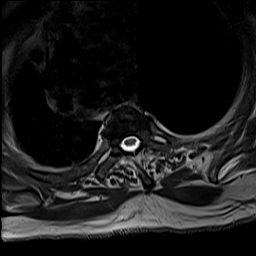
[im 44/44]
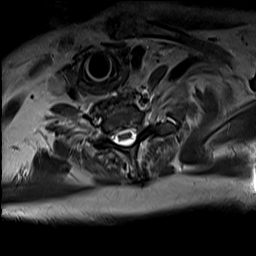

[Series 21: T1 · axial · 4.0mm · 0.39mm/px · z∈[-317,-37]mm · 8 of 44 slices shown (3 of 3)]
[im 1/44]
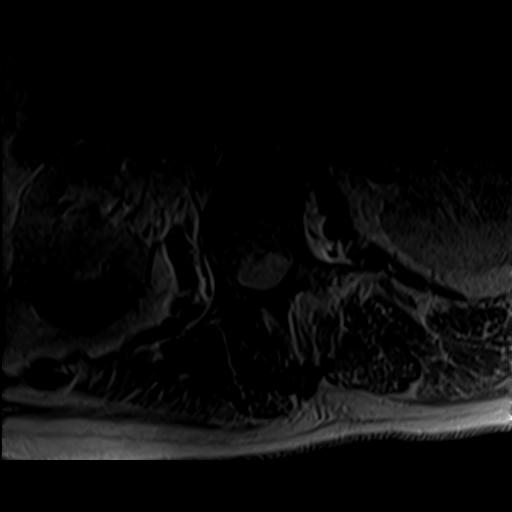
[im 7/44]
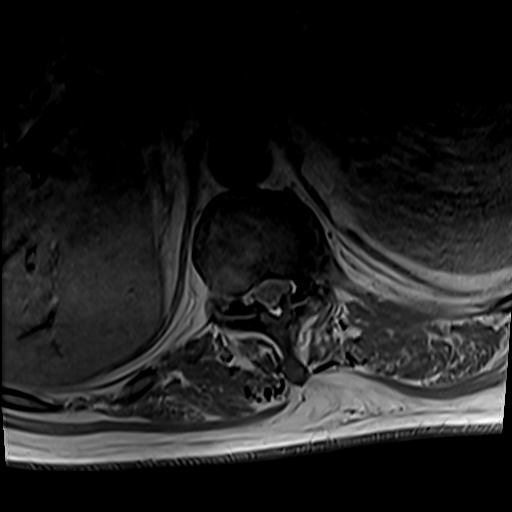
[im 13/44]
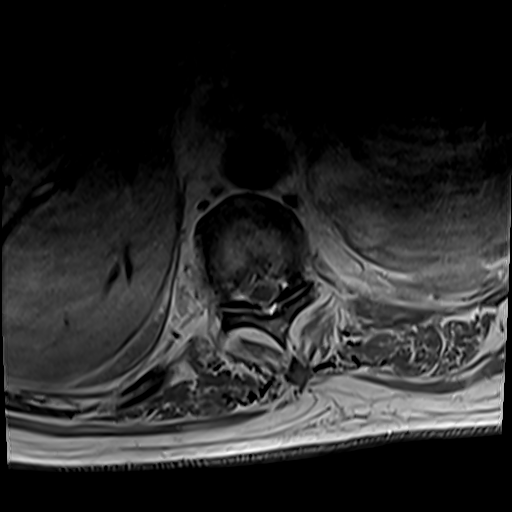
[im 19/44]
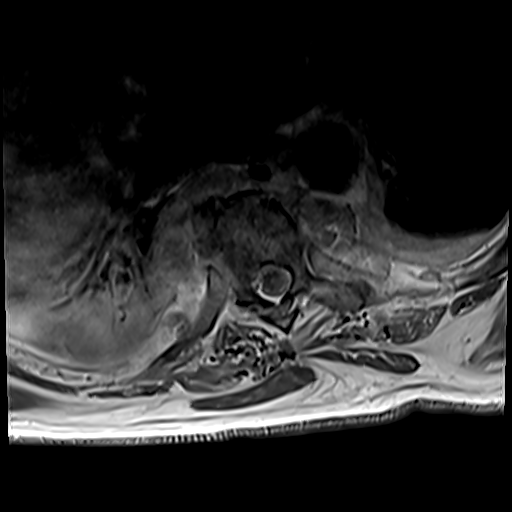
[im 25/44]
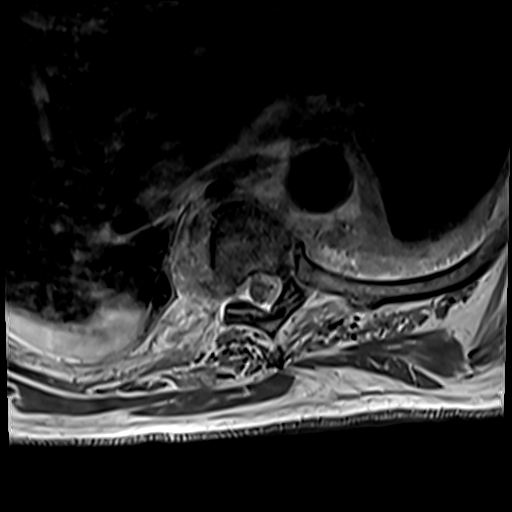
[im 31/44]
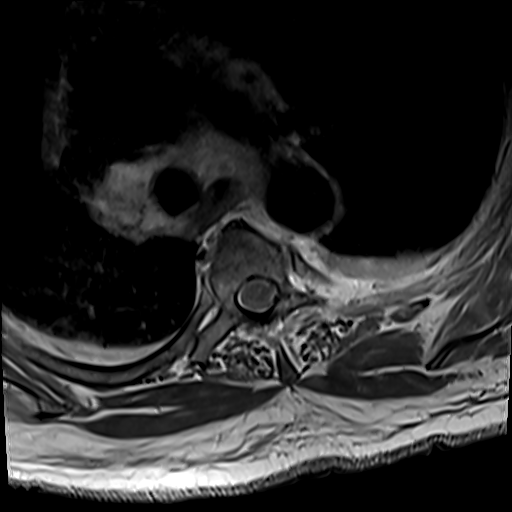
[im 37/44]
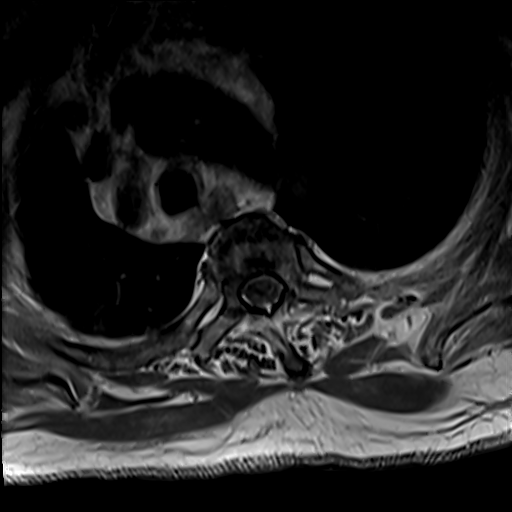
[im 44/44]
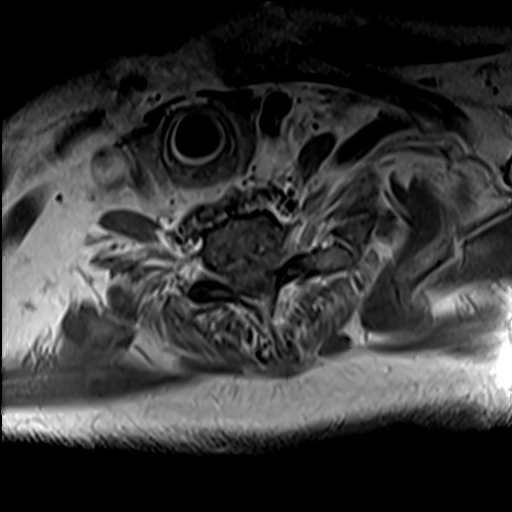

[Series 22: T2 post-contrast · sagittal · 3.0mm · 0.83mm/px · 3 of 15 slices shown]
[im 1/15]
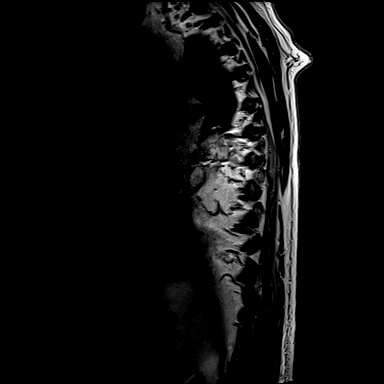
[im 8/15]
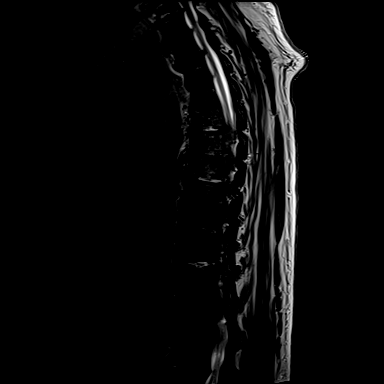
[im 15/15]
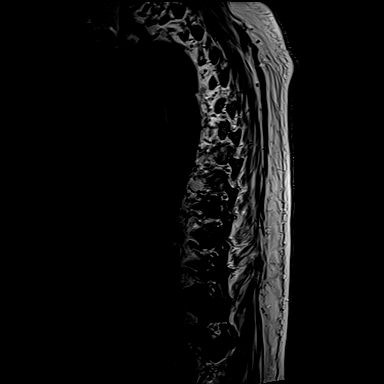

[Series 23: T1 fat-sat post-contrast · sagittal · 3.0mm · 1.00mm/px · 3 of 15 slices shown]
[im 1/15]
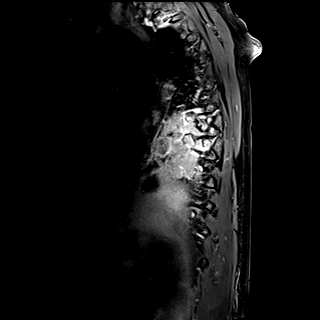
[im 8/15]
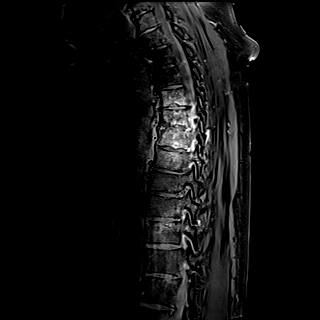
[im 15/15]
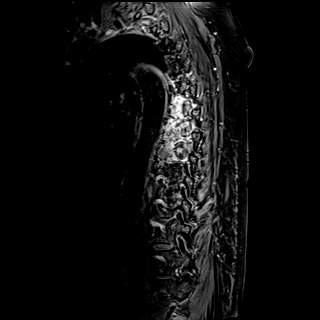

[Series 24: T1 post-contrast · axial · 4.0mm · 0.39mm/px · 1 of 44 slices shown]
[im 1/44]
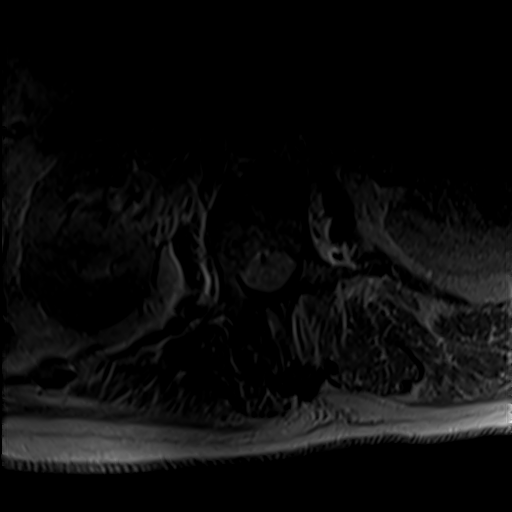

[30 of 48 positions shown; findings below may reference images not displayed]

FINDINGS: Alignment:  Physiologic.

Vertebrae: No acute fracture. No aggressive osseous lesion. Severe
bone marrow edema on either side of the T5-6 disc space, T6-7 disc
space and T7-8 disc space most concerning for osteomyelitis.
Paravertebral enhancing soft tissue consistent with a paravertebral
phlegmon with interspersed fluid signal areas concerning for an
abscess along the right paravertebral aspect measuring approximately
5.9 x 3.7 x 1.3 cm. Bilateral small pleural effusions. Right basilar
airspace disease concerning for pneumonia. Epidural thickening and
enhancement concerning for a thin epidural phlegmon. No epidural
abscess.

Fluid signal in the T10-11 disc space with marrow edema on either
side of the disc concerning for discitis-osteomyelitis. Epidural
thickening concerning for an epidural phlegmon with a small epidural
abscess measuring 2 x 12 mm along the posterior margin of the T11
vertebral body.

Cord:  Normal signal and morphology.

Paraspinal and other soft tissues: No acute paraspinal abnormality.

Disc levels:

Disc spaces: Degenerative disease with disc height loss at T5-6,
T6-7, T7-8, T8-9, T9-10, T10-11.

T1-T2: Minimal broad-based disc bulge. No foraminal or central canal
stenosis.

T2-T3: Minimal broad-based disc bulge. No foraminal or central canal
stenosis

T3-T4: No disc protrusion, foraminal stenosis or central canal
stenosis.

T4-T5: No disc protrusion, foraminal stenosis or central canal
stenosis.

T5-T6: Tiny right paracentral disc protrusion. No foraminal or
central canal stenosis.

T6-T7: Broad-based disc osteophyte complex. Mild right foraminal
stenosis. No left foraminal stenosis.

T7-T8: Mild broad-based disc bulge. No foraminal or central canal
stenosis.

T8-T9: Minimal broad-based disc bulge. No foraminal or central canal
stenosis

T9-T10: Minimal broad-based disc bulge. No central canal stenosis
mild bilateral foraminal stenosis.

T10-T11: Minimal broad-based disc bulge. Bilateral facet
arthropathy. Mild bilateral foraminal stenosis.

T11-T12: No disc protrusion, foraminal stenosis or central canal
stenosis.
IMPRESSION: 1. Discitis-osteomyelitis at T5-6, T6-7 and T7-8. Paravertebral
phlegmon with an abscess along the right paravertebral aspect
measuring approximately 5.9 x 3.7 x 1.3 cm and smaller left
paravertebral abscess measuring approximately 1.6 x 1.5 x 4.4 cm.
2. Discitis-osteomyelitis at T10-11 with a thin epidural phlegmon
and a small epidural abscess measuring 2 x 12 mm along the posterior
margin of the T11 vertebral body.
3. Right basilar airspace disease concerning for pneumonia.
Bilateral small pleural effusions.

## 2022-01-12 MED ORDER — LIDOCAINE HCL 2 % IJ SOLN: 5.0000 mL | Freq: Once | INTRAMUSCULAR | Status: DC

## 2022-01-12 MED ORDER — LIDOCAINE HCL (PF) 1 % IJ SOLN
5.0000 mL | Freq: Once | INTRAMUSCULAR | Status: AC
  Administered 2022-01-12: 5 mL
  Filled 2022-01-12 (×2): qty 5

## 2022-01-12 MED ORDER — ENOXAPARIN SODIUM 40 MG/0.4ML IJ SOSY: 40.0000 mg | PREFILLED_SYRINGE | INTRAMUSCULAR | Status: DC

## 2022-01-12 MED ORDER — LIDOCAINE HCL (PF) 2 % IJ SOLN: 10.0000 mL | Freq: Once | INTRAMUSCULAR | Status: DC

## 2022-01-12 MED ORDER — LIDOCAINE HCL 2 % IJ SOLN: 10.0000 mL | Freq: Once | INTRAMUSCULAR | Status: DC

## 2022-01-12 MED ORDER — GADOBUTROL 1 MMOL/ML IV SOLN
8.0000 mL | Freq: Once | INTRAVENOUS | Status: AC | PRN
  Administered 2022-01-12: 8 mL via INTRAVENOUS

## 2022-01-12 NOTE — Care Management Important Message (Signed)
Important Message ? ?Patient Details IM Letter placed in Patients room. ?Name: Vincent White. ?MRN: [id number] ?Date of Birth: [birth year] ? ? ?Medicare Important Message Given:  Yes ? ? ? ? ?Kerin Salen ?01/12/2022, 11:57 AM ?

## 2022-01-12 NOTE — [place] FL2 (Signed)
?[place] MEDICAID FL2 LEVEL OF CARE SCREENING TOOL  ?  ? ?IDENTIFICATION  ?Patient Name: ?Vincent White. Birthdate: Sep 11, 1951 Sex: male Admission Date (Current Location): ?01/04/2022  ?[place] and [place] Number: ? [place] ?  Facility and Address:  ?[facility],  [address], [place] ?     Provider Number: ?[id number]  ?Attending Physician Name and Address:  ?Patrecia Pour, MD ? Relative Name and Phone Number:  ?Holton,Teresa Spouse   [phone],QKMM,NOTRRNHA Daughter   [phone] ?   ?Current Level of Care: ?Hospital Recommended Level of Care: ?[place] Prior Approval Number: ?  ? ?Date Approved/Denied: ?  PASRR Number: ?[id number] A ? ?Discharge Plan: ?SNF ?  ? ?Current Diagnoses: ?Patient Active Problem List  ? Diagnosis Date Noted  ? Major depressive disorder, recurrent episode, moderate ([place]) 01/10/2022  ? MSSA bacteremia   ? Staphylococcal arthritis of left ankle ([place])   ? Pressure injury of skin 01/07/2022  ? Osteomyelitis of second toe of right foot ([place])   ? Hyponatremia 01/04/2022  ? Erectile dysfunction 01/04/2022  ? Mild pulmonary hypertension ([place]) 01/04/2022  ? Type 2 diabetes mellitus with other specified complication ([place]) 00/45/9977  ? Type 2 diabetes mellitus with hyperglycemia ([place]) 01/04/2022  ? Gout 01/04/2022  ? AKI (acute kidney injury) ([place]) 01/04/2022  ? Transaminitis 01/04/2022  ? CAP (community acquired pneumonia) 01/04/2022  ? OSA (obstructive sleep apnea) 01/04/2022  ? Injury of right second toe 01/04/2022  ? Mitral valve insufficiency 06/12/2018  ? Essential hypertension 06/12/2018  ? Dyslipidemia 06/12/2018  ? SOB (shortness of breath) 06/12/2018  ? Bruit 06/12/2018  ? ? ?Orientation RESPIRATION BLADDER Height & Weight   ?  ?Self, Time, Situation ? Normal External catheter Weight: 86.4 kg ?Height:  '5\' 6"'$  (167.6 cm)  ?BEHAVIORAL SYMPTOMS/MOOD NEUROLOGICAL BOWEL NUTRITION STATUS  ?    Continent Diet (Heart Healthy)  ?AMBULATORY STATUS  COMMUNICATION OF NEEDS Skin   ?Extensive Assist Verbally PU Stage and Appropriate Care (stage 2 right and Left Buttocks) ?  ?PU Stage 2 Dressing:  (As needed) ?  ?    ?     ?     ? ? ?Personal Care Assistance Level of Assistance  ?Bathing, Feeding, Dressing Bathing Assistance: Maximum assistance ?Feeding assistance: Limited assistance ?Dressing Assistance: Maximum assistance ?   ? ?Functional Limitations Info  ?Sight, Hearing, Speech Sight Info: Adequate ?Hearing Info: Adequate ?Speech Info: Adequate  ? ? ?SPECIAL CARE FACTORS FREQUENCY  ?PT (By licensed PT), OT (By licensed OT)   ?  ?PT Frequency: x5 week ?OT Frequency: x5 week ?  ?  ?  ?   ? ? ?Contractures Contractures Info: Not present  ? ? ?Additional Factors Info  ?Code Status, Allergies Code Status Info: FULL ?Allergies Info: Allopurinol, Flexeril (Cyclobenzaprine), Nasal Spray ?  ?  ?  ?   ? ?Current Medications (01/12/2022):  This is the current hospital active medication list ?Current Facility-Administered Medications  ?Medication Dose Route Frequency Provider Last Rate Last Admin  ? acetaminophen (TYLENOL) tablet 650 mg  650 mg Oral Q6H PRN Criselda Peaches, DPM   650 mg at 01/11/22 1530  ? Or  ? acetaminophen (TYLENOL) suppository 650 mg  650 mg Rectal Q6H PRN McDonald, Stephan Minister, DPM      ? albuterol (PROVENTIL) (2.5 MG/3ML) 0.083% nebulizer solution 2.5 mg  2.5 mg Nebulization Q4H PRN McDonald, Stephan Minister, DPM      ? amLODipine (NORVASC) tablet 10 mg  10 mg Oral Daily  Criselda Peaches, DPM   10 mg at 01/12/22 1950  ? ceFAZolin (ANCEF) IVPB 2g/100 mL premix  2 g Intravenous Q8H McDonald, Adam R, DPM 200 mL/hr at 01/12/22 1148 2 g at 01/12/22 1148  ? Chlorhexidine Gluconate Cloth 2 % PADS 6 each  6 each Topical Daily Criselda Peaches, DPM   6 each at 01/12/22 0945  ? colchicine tablet 0.3 mg  0.3 mg Oral BID Criselda Peaches, DPM   0.3 mg at 01/12/22 0941  ? And  ? probenecid (BENEMID) tablet 500 mg  500 mg Oral BID Criselda Peaches, DPM   500 mg at 01/12/22  0941  ? DULoxetine (CYMBALTA) DR capsule 20 mg  20 mg Oral Daily Patrecia Pour, NP   20 mg at 01/12/22 9326  ? folic acid (FOLVITE) tablet 1 mg  1 mg Oral Daily Criselda Peaches, DPM   1 mg at 01/12/22 7124  ? insulin aspart (novoLOG) injection 0-15 Units  0-15 Units Subcutaneous TID WC Criselda Peaches, DPM   3 Units at 01/12/22 1149  ? ipratropium (ATROVENT) nebulizer solution 0.5 mg  0.5 mg Nebulization Q4H PRN McDonald, Stephan Minister, DPM      ? lidocaine (LIDODERM) 5 % 1 patch  1 patch Transdermal Q24H Dana Allan I, MD   1 patch at 01/12/22 1220  ? methocarbamol (ROBAXIN) tablet 750 mg  750 mg Oral Q6H PRN Criselda Peaches, DPM   750 mg at 01/11/22 1530  ? multivitamin with minerals tablet 1 tablet  1 tablet Oral Daily Criselda Peaches, DPM   1 tablet at 01/12/22 5809  ? ondansetron (ZOFRAN) tablet 4 mg  4 mg Oral Q6H PRN Criselda Peaches, DPM      ? Or  ? ondansetron (ZOFRAN) injection 4 mg  4 mg Intravenous Q6H PRN Criselda Peaches, DPM      ? thiamine tablet 100 mg  100 mg Oral Daily Criselda Peaches, DPM   100 mg at 01/12/22 0941  ? Or  ? thiamine (B-1) injection 100 mg  100 mg Intravenous Daily Criselda Peaches, DPM   100 mg at 01/06/22 9833  ? ? ? ?Discharge Medications: ?Please see discharge summary for a list of discharge medications. ? ?Relevant Imaging Results: ? ?Relevant Lab Results: ? ? ?Additional Information ?[phone] ? ?Avian Greenawalt, RN ? ? ? ? ?

## 2022-01-12 NOTE — Progress Notes (Signed)
?Progress Note ? ?Patient: Vincent White. PPJ:[id number] DOB: 09-06-1951  ?DOA: 01/04/2022  DOS: 01/12/2022  ?  ?Brief hospital course: ?Tejas [place]. is a 71 year old male with past medical history significant for essential pretension, hyperlipidemia, cervical spondylosis, gout, vitamin D deficiency, mitral valve insufficiency, pulmonary HTN, type 2 diabetes mellitus, OSA on CPAP who presented to [facility] ED on 4/6 via EMS with generalized weakness, fatigue, poor oral intake and inability to ambulate over the last 2-3 weeks. He was admitted for sepsis, ultimately growing MSSA in urine and blood cultures. There were no vegetations on TTE, though repeat blood cultures were persistently positive. TEE will be performed, and blood cultures repeated 4/13 remain NGTD. Podiatry was consulted for a necrotic toe, and performed partial amputation of the right second toe 4/8. Subsequent investigation revealed a right lateral foot abscess for which bedside I&D was performed 4/12.  ? ?The patient's mood appears to be declining. Psychiatry was consulted and started cymbalta with no current concern for SI. ? ?Due to patient reporting back pain, MRI of the lumbar spine was performed revealing epidural abscess. MRI thoracic spine is ordered for further characterization, after which neurosurgery will be formally consulted.  ? ?Orthopedic surgery is consulted for bilateral L > R warm, erythematous knee swelling which was part of symptom constellation on admission. ? ?Assessment and Plan: ?Severe sepsis due to MSSA bacteremia with evidence of multifocal seeding of infection including UTI (+urine culture), thoracic epidural abscess, possible septic knee(s), right 2nd toe osteomyelitis (pathology report w/acute osteomyelitis, negative margins, +MSSA on culture 4/8) with refractory positive blood cultures (4/6, 4/8 (1 of 2), 4/10 (1 of 2)). ?- Follow repeat blood cultures from 4/12, NGTD. Leukocytosis is improved but still 20k.  ?- Continue ancef.  Pt's fever curve and leukocytosis are reassuring ?- I discussed with cardiology that the patient will need TEE. Note TTE was read with poor quality images. AV not well visualized. ?- I worry about septic prepatellar bursitis of the knee(s), consulted orthopedic surgery, Dr. Lorin Mercy, formally 4/14.  ?- Sepsis physiology has resolved. Midodrine was initiated but since discontinued. BP remains normotensive, HR is sinus tachycardia today, with a possible trend upward over past couple days. May need IVF, but pt is taking adequate po. will continue monitoring closely.  ? 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 Will discuss with neurosurgery after this has been performed. ? ?Lateral right foot abscess: s/p bedside I&D by podiatry 4/12.  ? ?Acute renal failure: Cr 2.2 on admission, multifactorial. s/p IV albumin and midodrine with hx cirrhosis and possible hepatorenal syndrome. Renal function ultimately improved but has begun trending upward again.  ?- Recheck in AM, avoid nephrotoxins.  ?- Continue holding metformin, losartan.  ? ?Major depressive disorder: Recurrent at this time. Worsened by pain per pt. No active SI.  ?- Psych consulted 4/12, started cymbalta '20mg'$  ?- Protracted alcohol cessation will be helpful as well. Some of these symptoms could be subacute withdrawal.  ? ?Hepatic cirrhosis: Possibly alcoholic vs. NASH. INR 2.2. Hepatitis panel negative, HIV NR. LFTs improving.  ? ?Gout: Pt had suspicion that knee swelling as gout flare. XR with small joint effusions but marked prepatellar swelling. Uric acid is 8.7.  ?- Continue probenecid, colchicine empirically.  ?- Ortho consulted, would benefit from diagnostic and therapeutic arthrocentesis/bursa aspiration.  ?- Pain control as ordered. ? ?Lower extremity edema: LVEF 60-65%, no RWMA, indeterminate diastolic function. Normal IVC. Echo 2021 has normal diastolic function. No DVT seen  on venous U/S. ?- Suspect  third-spacing/swelling due to liver disease.  ?  ?Reactive thrombocytosis: Noted.  ? ?Hyponatremia: Given its persistence, suspect this is related to cirrhosis. Was worsened by hypovolemia/sepsis initially (was 118). FENa was low. ?- Recheck metabolic panel in AM ? ?Acute urinary retention: s/p foley 4/7 ?- Once renal function confirmed to be stable (worsened at last check) we will do voiding trial.  ?  ?Alcohol abuse: No evidence of significant withdrawal.  ?- DC CIWA ?- Continue empiric thiamine, folate, MVM ?- Cessation counseling has been discussed at significant length.  ?  ?T2DM: Well-controlled with HbA1c 6.3%.  ?- Hold metformin ?- Continue moderate SSI, has been at inpatient goal.  ?  ?HTN:  ?- Continuing norvasc '10mg'$  home med (could consider [place] this to see what contribution it's having to LE edema) ?- Holding losartan due to AKI.   ?  ?Normocytic anemia: Stable ? ?HLD:  ?- Holding statin with LFT elevations ? ?OSA:  ?- CPAP qHS ? ?Stage 2 pressure injuries to bilateral buttocks: This is NOT believed to have been POA.  ?- Foam in place, needs offloading. Ideally would ambulate. Hopefully orthopedics can assist with this. ? ?Obesity: Estimated body mass index is 30.74 kg/m? as calculated from the following: ?  Height as of this encounter: '5\' 6"'$  (1.676 m). ?  Weight as of this encounter: 86.4 kg. ? ?Subjective: Pt will not speak to me or make eye contact or nod, etc. RN reports he was answering questions this morning, ate full meal and drank at breakfast. No other issues. Pt will not participate in ROS. ? ?Objective: ?Vitals:  ? 01/11/22 1900 01/11/22 2031 01/12/22 0418 01/12/22 1228  ?BP:  116/68 (!) 155/74 123/72  ?Pulse:  91 (!) 106 (!) 102  ?Resp:  '18 18 20  '$ ?Temp: 98 ?F (36.7 ?C) 98.2 ?F (36.8 ?C) 99 ?F (37.2 ?C) 98.4 ?F (36.9 ?C)  ?TempSrc:  Oral Oral Axillary  ?SpO2:  95% 94% 94%  ?Weight:      ?Height:      ? ?Gen: 71 y.o. male in no distress ?Pulm: Nonlabored breathing room air. Clear ?CV:  Regular tachycardia. No murmur, rub, or gallop. No JVD, bilateral dependent edema. ?GI: Abdomen soft, non-tender, non-distended, with normoactive bowel sounds.  ?Ext: Bilateral knees with large boggy prepatellar bursa that has overlying erythema and warmth. Pt does not appear to be in excruciating pain when palpated.  ?Skin: Possible vasculitic erythema found on RLE distally. Incomplete exam due to pt not cooperating this morning.  ?Neuro: Alert, not cooperative with exam. ?Psych: UTD ? ?Data Personally reviewed: ?CBC: ?Recent Labs  ?Lab 01/06/22 ?6433 01/07/22 ?0240 01/08/22 ?0244 01/10/22 ?1832  ?WBC 24.7* 25.1* 21.9* 20.5*  ?NEUTROABS  --  23.2*  --   --   ?HGB 11.2* 10.3* 10.3* 10.3*  ?HCT 32.8* 29.3* 29.6* 30.3*  ?MCV 88.6 87.7 87.1 89.9  ?PLT 324 372 444* 455*  ? ?Basic Metabolic Panel: ?Recent Labs  ?Lab 01/06/22 ?2951 01/06/22 ?1558 01/07/22 ?0240 01/07/22 ?1513 01/08/22 ?0244 01/10/22 ?1832  ?NA 125* 129* 132* 132* 134* 129*  ?K 4.0  --  4.0  --  3.6 3.8  ?CL 93*  --  99  --  103 97*  ?CO2 20*  --  21*  --  23 21*  ?GLUCOSE 82  --  137*  --  115* 142*  ?BUN 48*  --  56*  --  50* 45*  ?CREATININE 1.56*  --  1.50*  --  0.94 1.51*  ?CALCIUM 7.6*  --  8.1*  --  8.5* 8.3*  ?MG 2.2  --  2.6*  --   --   --   ?PHOS 4.0  --  4.4  --   --  4.0  ? ?GFR: ?Estimated Creatinine Clearance: 46.9 mL/min (A) (by C-G formula based on SCr of 1.51 mg/dL (H)). ?Liver Function Tests: ?Recent Labs  ?Lab 01/06/22 ?4627 01/07/22 ?0240 01/08/22 ?0244 01/10/22 ?1832  ?AST 139* 46* 50*  --   ?ALT 76* 22 18  --   ?ALKPHOS 171* 139* 100  --   ?BILITOT 2.5* 1.0 1.0  --   ?PROT 5.5* 5.8* 6.0*  --   ?ALBUMIN 1.6* 2.4* 3.0* 2.2*  ? ?No results for input(s): LIPASE, AMYLASE in the last 168 hours. ?No results for input(s): AMMONIA in the last 168 hours. ?Coagulation Profile: ?No results for input(s): INR, PROTIME in the last 168 hours. ?Cardiac Enzymes: ?No results for input(s): CKTOTAL, CKMB, CKMBINDEX, TROPONINI in the last 168 hours. ?BNP  (last 3 results) ?No results for input(s): PROBNP in the last 8760 hours. ?HbA1C: ?No results for input(s): HGBA1C in the last 72 hours. ?CBG: ?Recent Labs  ?Lab 01/11/22 ?1139 01/11/22 ?1708 01/11/22 ?2022

## 2022-01-12 NOTE — Consult Note (Signed)
Brief Psychiatry Consult Note  ?The patient was last seen by the psychiatry service for full consult on 4/12.  Safety planning done with patient and wife at that time.  Interim documentation by primary team and nursing staff has been reviewed. At this time, patient is mildly confused and there is no evidence of acute psychiatric disturbance requiring ongoing psychiatric consultation.  I did see briefly today; patient oriented to self and situation and year and denied SI/HI/AH/VH.  Had some difficulty with month. Endorses better mood over the last few days which he attributes to better pain control.  Endorses some ongoing "nerves"; discussed latency of onset of action of antidepressants for anxiety.  Identifies goal of "living a peaceful life".  Please see last consult note for full assessment. Final medication recommendations are as follows: ? ?- c duloxetine 20  mg ? ?We will sign off at this time. This has been communicated to the primary team. If issues arise in the future, don't hesitate to reconsult the Psychiatry Inpatient Consult Service.  ? ?Joycelyn Schmid A Ival Pacer ? ?

## 2022-01-12 NOTE — TOC Progression Note (Signed)
Transition of Care (TOC) - Progression Note  ? ? ?Patient Details  ?Name: Vincent White. ?MRN: [id number] ?Date of Birth: 02/19/1951 ? ?Transition of Care (TOC) CM/SW Contact  ?Purcell Mouton, RN ?Phone Number: ?01/12/2022, 2:32 PM ? ?Clinical Narrative:    ?Spoke with pt's wife concerning SNF. Helene Kelp asked for pt to be faxed to [facility]. FL2 was faxed to [facility] and other facilities.  ? ? ?Expected Discharge Plan: [place] ?Barriers to Discharge: Continued Medical Work up ? ?Expected Discharge Plan and Services ?Expected Discharge Plan: [place] ?  ?  ?  ?Living arrangements for the past 2 months: [place] ?                ?  ?  ?  ?  ?  ?  ?  ?  ?  ?  ? ? ?Social Determinants of Health (SDOH) Interventions ?  ? ?Readmission Risk Interventions ?   ? View : No data to display.  ?  ?  ?  ? ? ?

## 2022-01-12 NOTE — Plan of Care (Signed)
Pt's INR 4.9 (previous 2.2). ? ?Plan: holding enoxaparin and repeating INR in AM. ? ?

## 2022-01-12 NOTE — Consult Note (Signed)
? ?Reason for Consult: Request for knee aspirations. ?Referring Physician: Vance Gather MD ? ?Vincent White. is an 71 y.o. male.  ?HPI: 71 year old male with MSSA bacteremia recent toe amputation by podiatry second toe type 2 diabetes, gout, acute kidney injury some current encephalopathy was noted to have bilateral puffy boggy knees particular in subcutaneous tissue and aspiration of was requested. ? ?Past Medical History:  ?Diagnosis Date  ? Cervical spondylosis   ? Gout   ? History of kidney stones   ? HLD (hyperlipidemia)   ? Hypertension   ? OSA (obstructive sleep apnea)   ? CPAP  ? Vitamin D deficiency   ? ? ?Past Surgical History:  ?Procedure Laterality Date  ? AMPUTATION TOE Right 01/06/2022  ? Procedure: AMPUTATION TOE;  Surgeon: Criselda Peaches, DPM;  Location: [facility];  Service: Podiatry;  Laterality: Right;  ? bone spur surgery  02/05/2017  ? mass removed on left elbow  ? BUNIONECTOMY Right 2014  ? SHOULDER SURGERY Right   ? thumb surgery Right   ? VASECTOMY    ? ? ?Family History  ?Problem Relation Age of Onset  ? Cancer - Other Mother   ?     bone  ? Diabetes Mother   ? Heart attack Father 87  ? Diabetes Father   ? Hypertension Father   ? Cancer - Other Sister   ?     breast  ? Gout Brother   ? Hypertension Maternal Grandmother   ? Hypertension Maternal Grandfather   ? Colon cancer Neg Hx   ? Liver disease Neg Hx   ? Kidney disease Neg Hx   ? ? ?Social History:  reports that he has never smoked. He has never used smokeless tobacco. He reports current alcohol use of about 1.0 standard drink per week. He reports that he does not use drugs. ? ?Allergies:  ?Allergies  ?Allergen Reactions  ? Allopurinol Other (See Comments)  ?  Dehydrated   ? Flexeril [Cyclobenzaprine] Other (See Comments)  ?  confusion  ? Nasal Spray Other (See Comments)  ?  Elevated BP  ? ? ?Medications: I have reviewed the patient's current medications. ? ?Results for orders placed or performed during the hospital encounter of 01/04/22  (from the past 48 hour(s))  ?CBC     Status: Abnormal  ? Collection Time: 01/10/22  6:32 PM  ?Result Value Ref Range  ? WBC 20.5 (H) 4.0 - 10.5 K/uL  ? RBC 3.37 (L) 4.22 - 5.81 MIL/uL  ? Hemoglobin 10.3 (L) 13.0 - 17.0 g/dL  ? HCT 30.3 (L) 39.0 - 52.0 %  ? MCV 89.9 80.0 - 100.0 fL  ? MCH 30.6 26.0 - 34.0 pg  ? MCHC 34.0 30.0 - 36.0 g/dL  ? RDW 14.7 11.5 - 15.5 %  ? Platelets 455 (H) 150 - 400 K/uL  ? nRBC 0.0 0.0 - 0.2 %  ?  Comment: Performed at [facility], [place] [address], [place], [place] [address]  ?Renal function panel     Status: Abnormal  ? Collection Time: 01/10/22  6:32 PM  ?Result Value Ref Range  ? Sodium 129 (L) 135 - 145 mmol/L  ? Potassium 3.8 3.5 - 5.1 mmol/L  ? Chloride 97 (L) 98 - 111 mmol/L  ? CO2 21 (L) 22 - 32 mmol/L  ? Glucose, Bld 142 (H) 70 - 99 mg/dL  ?  Comment: Glucose reference range applies only to samples taken after fasting for at least 8  hours.  ? BUN 45 (H) 8 - 23 mg/dL  ? Creatinine, Ser 1.51 (H) 0.61 - 1.24 mg/dL  ? Calcium 8.3 (L) 8.9 - 10.3 mg/dL  ? Phosphorus 4.0 2.5 - 4.6 mg/dL  ? Albumin 2.2 (L) 3.5 - 5.0 g/dL  ? GFR, Estimated 49 (L) >60 mL/min  ?  Comment: (NOTE) ?Calculated using the CKD-EPI Creatinine Equation (2021) ?  ? Anion gap 11 5 - 15  ?  Comment: Performed at [facility], [place] [address], [place], [place] [address]  ?Glucose, capillary     Status: Abnormal  ? Collection Time: 01/10/22  8:21 PM  ?Result Value Ref Range  ? Glucose-Capillary 125 (H) 70 - 99 mg/dL  ?  Comment: Glucose reference range applies only to samples taken after fasting for at least 8 hours.  ?Glucose, capillary     Status: Abnormal  ? Collection Time: 01/11/22  7:34 AM  ?Result Value Ref Range  ? Glucose-Capillary 104 (H) 70 - 99 mg/dL  ?  Comment: Glucose reference range applies only to samples taken after fasting for at least 8 hours.  ?Glucose, capillary     Status: Abnormal  ? Collection Time: 01/11/22 11:39 AM  ?Result Value Ref Range  ?  Glucose-Capillary 122 (H) 70 - 99 mg/dL  ?  Comment: Glucose reference range applies only to samples taken after fasting for at least 8 hours.  ?Glucose, capillary     Status: Abnormal  ? Collection Time: 01/11/22  5:08 PM  ?Result Value Ref Range  ? Glucose-Capillary 114 (H) 70 - 99 mg/dL  ?  Comment: Glucose reference range applies only to samples taken after fasting for at least 8 hours.  ?Glucose, capillary     Status: Abnormal  ? Collection Time: 01/11/22  8:22 PM  ?Result Value Ref Range  ? Glucose-Capillary 112 (H) 70 - 99 mg/dL  ?  Comment: Glucose reference range applies only to samples taken after fasting for at least 8 hours.  ?Glucose, capillary     Status: Abnormal  ? Collection Time: 01/12/22  7:33 AM  ?Result Value Ref Range  ? Glucose-Capillary 109 (H) 70 - 99 mg/dL  ?  Comment: Glucose reference range applies only to samples taken after fasting for at least 8 hours.  ?Glucose, capillary     Status: Abnormal  ? Collection Time: 01/12/22 11:07 AM  ?Result Value Ref Range  ? Glucose-Capillary 173 (H) 70 - 99 mg/dL  ?  Comment: Glucose reference range applies only to samples taken after fasting for at least 8 hours.  ? ? ?MR CERVICAL SPINE W WO CONTRAST ? ?Result Date: 01/11/2022 ?CLINICAL DATA:  Rule out spinal infection EXAM: MRI CERVICAL SPINE WITHOUT AND WITH CONTRAST TECHNIQUE: Multiplanar and multiecho pulse sequences of the cervical spine, to include the craniocervical junction and cervicothoracic junction, were obtained without and with intravenous contrast. CONTRAST:  49m GADAVIST GADOBUTROL 1 MMOL/ML IV SOLN COMPARISON:  None. FINDINGS: Alignment: Normal alignment.  Straightening of the cervical lordosis Vertebrae: Negative for fracture or mass. No evidence of spinal infection. Small hemangioma T1 vertebral body. Cord: Normal signal and morphology. Posterior Fossa, vertebral arteries, paraspinal tissues: Negative Disc levels: C2-3: Disc degeneration and spurring on the right with moderate to  severe right foraminal encroachment. Mild left foraminal encroach C3-4: Disc degeneration and diffuse uncinate spurring. Moderate foraminal encroachment bilaterally. Spinal canal adequate in size C4-5: Disc degeneration and diffuse uncinate spurring. Moderate to advanced facet degeneration on the left. Severe left foraminal  encroachment. Moderate right foraminal encroachment. Spinal canal adequate in diameter C5-6: Disc degeneration with uncinate spurring. Mild foraminal narrowing bilaterally C6-7: Disc degeneration and diffuse uncinate spurring. Moderate to severe left foraminal encroachment. Mild right foraminal encroachment. Spinal canal adequate in diameter C7-T1: Disc degeneration and diffuse uncinate spurring. Moderate foraminal encroachment bilaterally. IMPRESSION: Negative for cervical spine infection.  No acute abnormality Cervical spondylosis with bilateral foraminal encroachment at multiple levels due to spurring. Electronically Signed   By: Franchot Gallo M.D.   On: 01/11/2022 18:13  ? ?MR THORACIC SPINE W WO CONTRAST ? ?Result Date: 01/12/2022 ?CLINICAL DATA:  Thoracic osteomyelitis. EXAM: MRI THORACIC WITHOUT AND WITH CONTRAST TECHNIQUE: Multiplanar and multiecho pulse sequences of the thoracic spine were obtained without and with intravenous contrast. CONTRAST:  81m GADAVIST GADOBUTROL 1 MMOL/ML IV SOLN COMPARISON:  None. FINDINGS: Alignment:  Physiologic. Vertebrae: No acute fracture. No aggressive osseous lesion. Severe bone marrow edema on either side of the T5-6 disc space, T6-7 disc space and T7-8 disc space most concerning for osteomyelitis. Paravertebral enhancing soft tissue consistent with a paravertebral phlegmon with interspersed fluid signal areas concerning for an abscess along the right paravertebral aspect measuring approximately 5.9 x 3.7 x 1.3 cm. Bilateral small pleural effusions. Right basilar airspace disease concerning for pneumonia. Epidural thickening and enhancement concerning  for a thin epidural phlegmon. No epidural abscess. Fluid signal in the T10-11 disc space with marrow edema on either side of the disc concerning for discitis-osteomyelitis. Epidural thickening concerning for an epidural p

## 2022-01-12 NOTE — Progress Notes (Addendum)
Occupational Therapy Treatment ?Patient Details ?Name: Vincent White. ?MRN: [id number] ?DOB: 04-16-1951 ?Today's Date: 01/12/2022 ? ? ?History of present illness Patient is 71 y.o. male s/p Rt second toe amputation on 01/06/22 due to osteomyelitis. He had presented for weakness, fatigue, and difficulty ambulating due to knee pain from gout flare up and Rt toe wound. PMH significant for gout, HLD, HTN, OSA, cervical spondylosis. MRI of the lumbar spine was performed revealing epidural abscess. MRI thoracic spine is ordered for further characterization, after which neurosurgery will be formally consulted. Orthopedic surgery is consulted for bilateral L > R warm, erythematous knee swelling which was part of symptom constellation on admission. ?  ?OT comments ? Treatment focused on BUE exercises and edema reduction of left upper extremity. Did not attempt transfers at this time due to neurosurgery consultation and awaiting to see if any precautions with movement as well as not having +2 assistance. Patient declined any ADL task. His affect is flat, he is not oriented to month or year and his attention span is poor - requiring therapist to cue him to help him count exercises (as he forgets the number or exercise being performed). Continue to recommend short term rehab at discharge.  ? ?Recommendations for follow up therapy are one component of a multi-disciplinary discharge planning process, led by the attending physician.  Recommendations may be updated based on patient status, additional functional criteria and insurance authorization. ?   ?Follow Up Recommendations ? Skilled nursing-short term rehab (<3 hours/day)  ?  ?Assistance Recommended at Discharge Frequent or constant Supervision/Assistance  ?Patient can return home with the following ? Two people to help with walking and/or transfers;Two people to help with bathing/dressing/bathroom;Direct supervision/assist for medications management;Help with stairs or ramp for  entrance;Assist for transportation;Direct supervision/assist for financial management;Assistance with cooking/housework ?  ?Equipment Recommendations ? BSC/3in1  ?  ?Recommendations for Other Services   ? ?  ?Precautions / Restrictions Precautions ?Precautions: Fall ?Restrictions ?Weight Bearing Restrictions: Yes ?RLE Weight Bearing: Weight bearing as tolerated ?Other Position/Activity Restrictions: per Podiatry PN note 4/12 "WBAT in [place]"  ? ? ?  ? ?Mobility Bed Mobility ?  ?  ?  ?  ?  ?  ?  ?  ?  ? ?Transfers ?  ?  ?  ?  ?  ?  ?  ?  ?  ?  ?  ?  ?Balance   ?  ?  ?  ?  ?  ?  ?  ?  ?  ?  ?  ?  ?  ?  ?  ?  ?  ?  ?   ? ?ADL either performed or assessed with clinical judgement  ? ?ADL   ?  ?  ?  ?  ?  ?  ?  ?  ?  ?  ?  ?  ?  ?  ?  ?  ?  ?  ?  ?  ?  ? ?Extremity/Trunk Assessment   ?  ?  ?  ?  ?  ? ?Vision   ?  ?  ?Perception   ?  ?Praxis   ?  ? ?Cognition Arousal/Alertness: Awake/alert ?Behavior During Therapy: Flat affect ?Overall Cognitive Status: No family/caregiver present to determine baseline cognitive functioning ?  ?  ?  ?  ?  ?  ?  ?  ?  ?  ?  ?  ?  ?  ?  ?  ?General Comments: Alert to self and  hospital. Answered wrong Month and Year. ?  ?  ?   ?Exercises Other Exercises ?Other Exercises: RUE: Punch Ups x 10, Punch Outs x 10, elbow flexion x 10 ?Other Exercises: LUE: Shoulder Flexion x 10 with active assist, elbow flexion x 10, wrist extension x 10, fist pumps x 10 with ball, edema reduction technique to left hand and forearm ? ?  ?Shoulder Instructions   ? ? ?  ?General Comments    ? ? ?Pertinent Vitals/ Pain       Pain Assessment ?Pain Assessment: No/denies pain ? ?Home Living   ?  ?  ?  ?  ?  ?  ?  ?  ?  ?  ?  ?  ?  ?  ?  ?  ?  ?  ? ?  ?Prior Functioning/Environment    ?  ?  ?  ?   ? ?Frequency ? Min 2X/week  ? ? ? ? ?  ?Progress Toward Goals ? ?OT Goals(current goals can now be found in the care plan section) ? Progress towards OT goals: Not progressing toward goals - comment (evolving medical  issues) ? ?Acute Rehab OT Goals ?OT Goal Formulation: Patient unable to participate in goal setting ?Time For Goal Achievement:  ?Potential to Achieve Goals: Fair  ?Plan Discharge plan remains appropriate   ? ?Co-evaluation ? ? ?   ?  ?  ?OT goals addressed during session: Strengthening/ROM ?  ? ?  ?AM-PAC OT "6 Clicks" Daily Activity     ?Outcome Measure ? ? Help from another person eating meals?: A Little ?Help from another person taking care of personal grooming?: A Little ?Help from another person toileting, which includes using toliet, bedpan, or urinal?: Total ?Help from another person bathing (including washing, rinsing, drying)?: Total ?Help from another person to put on and taking off regular upper body clothing?: Total ?Help from another person to put on and taking off regular lower body clothing?: Total ?6 Click Score: 10 ? ?  ?End of Session   ? ?OT Visit Diagnosis: Pain;Muscle weakness (generalized) (M62.81) ?  ?Activity Tolerance Patient tolerated treatment well ?  ?Patient Left in bed;with call bell/phone within reach ?  ?Nurse Communication  (okay to see) ?  ? ?   ? ?Time: 3013-1438 ?OT Time Calculation (min): 12 min ? ?Charges: OT General Charges ?$OT Visit: 1 Visit ?OT Treatments ?$Therapeutic Exercise: 8-22 mins ? ?Krisna Omar, OTR/L ?Acute Care Rehab Services  ?Office [phone] ?Pager: [phone]  ? ?Zell Hylton L [place] ?01/12/2022, 3:58 PM ?

## 2022-01-13 DIAGNOSIS — N179 Acute kidney failure, unspecified: Secondary | ICD-10-CM | POA: Diagnosis not present

## 2022-01-13 DIAGNOSIS — J189 Pneumonia, unspecified organism: Secondary | ICD-10-CM | POA: Diagnosis not present

## 2022-01-13 DIAGNOSIS — E785 Hyperlipidemia, unspecified: Secondary | ICD-10-CM | POA: Diagnosis not present

## 2022-01-13 DIAGNOSIS — E871 Hypo-osmolality and hyponatremia: Secondary | ICD-10-CM | POA: Diagnosis not present

## 2022-01-13 LAB — CULTURE, BLOOD (ROUTINE X 2)
Culture: NO GROWTH
Special Requests: ADEQUATE

## 2022-01-13 LAB — COMPREHENSIVE METABOLIC PANEL
ALT: 7 U/L (ref 0–44)
AST: 79 U/L — ABNORMAL HIGH (ref 15–41)
Albumin: 2 g/dL — ABNORMAL LOW (ref 3.5–5.0)
Alkaline Phosphatase: 158 U/L — ABNORMAL HIGH (ref 38–126)
Anion gap: 15 (ref 5–15)
BUN: 90 mg/dL — ABNORMAL HIGH (ref 8–23)
CO2: 20 mmol/L — ABNORMAL LOW (ref 22–32)
Calcium: 8.3 mg/dL — ABNORMAL LOW (ref 8.9–10.3)
Chloride: 96 mmol/L — ABNORMAL LOW (ref 98–111)
Creatinine, Ser: 2.63 mg/dL — ABNORMAL HIGH (ref 0.61–1.24)
GFR, Estimated: 25 mL/min — ABNORMAL LOW (ref >60)
Glucose, Bld: 108 mg/dL — ABNORMAL HIGH (ref 70–99)
Potassium: 3.5 mmol/L (ref 3.5–5.1)
Sodium: 131 mmol/L — ABNORMAL LOW (ref 135–145)
Total Bilirubin: 1.1 mg/dL (ref 0.3–1.2)
Total Protein: 6.4 g/dL — ABNORMAL LOW (ref 6.5–8.1)

## 2022-01-13 LAB — PROTEIN / CREATININE RATIO, URINE
Creatinine, Urine: 126.23 mg/dL
Protein Creatinine Ratio: 1.65 mg/mg{Cre} — ABNORMAL HIGH (ref 0.00–0.15)
Total Protein, Urine: 207.84 mg/dL

## 2022-01-13 LAB — CBC WITH DIFFERENTIAL/PLATELET
Abs Immature Granulocytes: 0.31 10*3/uL — ABNORMAL HIGH (ref 0.00–0.07)
Basophils Absolute: 0 10*3/uL (ref 0.0–0.1)
Basophils Relative: 0 %
Eosinophils Absolute: 0 10*3/uL (ref 0.0–0.5)
Eosinophils Relative: 0 %
HCT: 31.4 % — ABNORMAL LOW (ref 39.0–52.0)
Hemoglobin: 10.6 g/dL — ABNORMAL LOW (ref 13.0–17.0)
Immature Granulocytes: 2 %
Lymphocytes Relative: 5 %
Lymphs Abs: 0.7 10*3/uL (ref 0.7–4.0)
MCH: 29.7 pg (ref 26.0–34.0)
MCHC: 33.8 g/dL (ref 30.0–36.0)
MCV: 88 fL (ref 80.0–100.0)
Monocytes Absolute: 0.6 10*3/uL (ref 0.1–1.0)
Monocytes Relative: 4 %
Neutro Abs: 13.7 10*3/uL — ABNORMAL HIGH (ref 1.7–7.7)
Neutrophils Relative %: 89 %
Platelets: 614 10*3/uL — ABNORMAL HIGH (ref 150–400)
RBC: 3.57 MIL/uL — ABNORMAL LOW (ref 4.22–5.81)
RDW: 14.7 % (ref 11.5–15.5)
WBC: 15.4 10*3/uL — ABNORMAL HIGH (ref 4.0–10.5)
nRBC: 0 % (ref 0.0–0.2)

## 2022-01-13 LAB — GLUCOSE, CAPILLARY
Glucose-Capillary: 137 mg/dL — ABNORMAL HIGH (ref 70–99)
Glucose-Capillary: 190 mg/dL — ABNORMAL HIGH (ref 70–99)
Glucose-Capillary: 91 mg/dL (ref 70–99)
Glucose-Capillary: 107 mg/dL — ABNORMAL HIGH (ref 70–99)

## 2022-01-13 LAB — VITAMIN B12: Vitamin B-12: 2022 pg/mL — ABNORMAL HIGH (ref 180–914)

## 2022-01-13 LAB — PROTIME-INR
INR: 2.9 — ABNORMAL HIGH (ref 0.8–1.2)
Prothrombin Time: 29.8 seconds — ABNORMAL HIGH (ref 11.4–15.2)

## 2022-01-13 LAB — SODIUM, URINE, RANDOM: Sodium, Ur: <10 mmol/L

## 2022-01-13 MED ORDER — ALBUMIN HUMAN 5 % IV SOLN
12.5000 g | Freq: Once | INTRAVENOUS | Status: AC
  Administered 2022-01-13: 12.5 g via INTRAVENOUS
  Filled 2022-01-13: qty 250

## 2022-01-13 MED ORDER — OXYCODONE HCL 5 MG PO TABS
5.0000 mg | ORAL_TABLET | Freq: Once | ORAL | Status: AC
  Administered 2022-01-13: 5 mg via ORAL
  Filled 2022-01-13: qty 1

## 2022-01-13 MED ORDER — MELATONIN 3 MG PO TABS
3.0000 mg | ORAL_TABLET | Freq: Every day | ORAL | Status: DC
  Administered 2022-01-13 – 2022-01-14 (×2): 3 mg via ORAL
  Filled 2022-01-13 (×2): qty 1

## 2022-01-13 MED ORDER — SODIUM CHLORIDE 0.9 % IV SOLN: INTRAVENOUS | Status: DC

## 2022-01-13 NOTE — Progress Notes (Signed)
?Progress Note ? ?Patient: Vincent White. MAU:[id number] DOB: [birth year]  ?DOA: 01/04/2022  DOS: 01/13/2022  ?  ?Brief hospital course: ?Ludie Pavlik. is a 71 year old male with past medical history significant for essential pretension, hyperlipidemia, cervical spondylosis, gout, vitamin D deficiency, mitral valve insufficiency, pulmonary HTN, type 2 diabetes mellitus, OSA on CPAP who presented to [facility] ED on 4/6 via EMS with generalized weakness, fatigue, poor oral intake and inability to ambulate over the last 2-3 weeks. He was admitted for sepsis, ultimately growing MSSA in urine and blood cultures. There were no vegetations on TTE, though repeat blood cultures were persistently positive. TEE will be performed, and blood cultures repeated 4/13 remain NGTD. Podiatry was consulted for a necrotic toe, and performed partial amputation of the right second toe 4/8. Subsequent investigation revealed a right lateral foot abscess for which bedside I&D was performed 4/12.  ? ?The patient's mood appears to be declining. Psychiatry was consulted and started cymbalta with no current concern for SI. ? ?Due to patient reporting back pain, MRI of the lumbar spine was performed revealing epidural abscess seen to be discitis-osteomyelitis at many levels of thoracic spine with paravertebral phlegmon/abscess and epidural abscess at T11. Neurosurgery is consulted.   ? ?Developing AKI again after trial off IVF.  ? ?Assessment and Plan: ?Severe sepsis due to MSSA bacteremia with evidence of multifocal seeding of infection including UTI (+urine culture), thoracic epidural abscess, possible septic knee(s), right 2nd toe osteomyelitis (pathology report w/acute osteomyelitis, negative margins, +MSSA on culture 4/8) with refractory positive blood cultures (4/6, 4/8 (1 of 2), 4/10 (1 of 2)). ?- Follow repeat blood cultures from 4/12, NGTD x3 days thus far. Leukocytosis is improved further today to 15k. ?- Continue ancef.  ?- I discussed with  cardiology that the patient will need TEE. Note TTE was read with poor quality images. AV not well visualized. ?- Sepsis physiology has resolved. Midodrine was initiated but since discontinued. BP remains normotensive, HR is sinus tachycardia today, with a possible trend upward over past couple days. May need IVF, but pt is taking adequate po. will continue monitoring closely.  ? ?Gouty tophi of knees: Dry aspiration without purulence by orthopedics 4/14. No concern for septic joint at this time.  ? 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  ?- Neurosurgery, [place], consulted and will review images. Part of patient's primary complaint on admission was leg weakness leading to inability to walk. His exam is dubious due to inability to consistently follow commands and limited participation due to pain, but I cannot say that he has strength in his legs at this time.  ? ?Lateral right foot abscess: s/p bedside I&D by podiatry 4/12.  ? ?Acute renal failure: Cr 2.2 on admission, multifactorial. s/p IV albumin and midodrine with hx cirrhosis and possible hepatorenal syndrome. Renal function ultimately improved but has begun trending upward again.  ?- Pt's family reports severely inadequate po intake and he continues to have insensible losses. Will restart IVF (though d/w family this will causing worsening anasarca), give with albumin. If not improving, would need nephrology evaluation. Check urine lytes, etc.  ?- Continue holding metformin, losartan.  ? ?Major depressive disorder: Recurrent at this time. Worsened by pain per pt. No active SI.  ?- Psych consulted 4/12, started cymbalta '20mg'$ . Pt reports improved mood but fluctuates dramatically between near catatonia to high volume of speech. Today is the  latter, and he is very confused with visual  hallucinations but does not have pressured speech. Still sleeping ok. Will monitor closely for evidence of mania, though currently consistent with hyperactive delirium.  ?- Protracted alcohol cessation will be helpful as well. Some of these symptoms could be subacute withdrawal.  ? ?Hepatic cirrhosis: Possibly alcoholic vs. NASH. INR elevated and albumin very low consistent with impaired synthetic function. Hepatitis panel negative, HIV NR. LFTs improved ? ?Gout: Pt had suspicion that knee swelling as gout flare. XR with small joint effusions but marked prepatellar swelling. Uric acid is 8.7.  ?- Continue probenecid, colchicine empirically.  ?- Ortho consulted, bedside aspiration was dry on 4/14. ?- Pain control as ordered. ? ?Lower extremity edema, actually appears more to be anasarca: LVEF 60-65%, no RWMA, indeterminate diastolic function. Normal IVC. Echo 2021 has normal diastolic function. No DVT seen on venous U/S. ?- Suspect third-spacing/swelling due to liver disease. Given albumin previously during hospitalization. Appears extravascularly overloaded, though renal impairment suggests intravascular depletion. Will give albumin and IVF today and monitor closely.  ?  ?Reactive thrombocytosis: Noted.  ? ?Hyponatremia: Improved at goal rate after admission from nadir of 118. FENa at that time clearly prerenal and has since improved to very mild, generally in low 130's. Given its persistence, suspect this is related to cirrhosis.   ?- Persistent. Giving IVF for AKI. Doubt this level of sodium abnormality is affecting mental status significantly.  ? ?Acute urinary retention: s/p foley 4/7 ?- Continue foley for now. ?  ?Alcohol abuse: No evidence of significant withdrawal.  ?- Continue empiric thiamine, folate, MVM ?- Cessation counseling has been discussed at significant length.  ? ?History of opioid dependence: Has been weaned, is known by family to have had compulsive behaviors obtaining opioids in the past.  ?   ?T2DM: Well-controlled with HbA1c 6.3%.  ?- Hold metformin ?- Continue moderate SSI, remains at inpatient goal. Continue regular diet given good control and need to increase po intake. ?  ?HTN:  ?- Continuing norvasc '10mg'$  home med (could consider [place] this to see what contribution it's having to LE edema). Normotensive. ?- Holding losartan due to AKI.   ?  ?Normocytic anemia: Stable ? ?HLD:  ?- Holding statin with LFT elevations ? ?OSA:  ?- CPAP qHS ? ?Stage 2 pressure injuries to bilateral buttocks: This is NOT believed to have been POA.  ?- Foam in place, needs offloading. Ideally would ambulate. Hopefully orthopedics can assist with this. ? ?Obesity: Estimated body mass index is 30.74 kg/m? as calculated from the following: ?  Height as of this encounter: '5\' 6"'$  (1.676 m). ?  Weight as of this encounter: 86.4 kg. ? ?Subjective: Pt will not speak to me or make eye contact or nod, etc. RN reports he was answering questions this morning, ate full meal and drank at breakfast. No other issues. Pt will not participate in ROS. ? ?Objective: ?Vitals:  ? 01/12/22 1952 01/13/22 0517 01/13/22 0900 01/13/22 1200  ?BP: 124/70 109/63 132/62 122/78  ?Pulse: 92 92 99 96  ?Resp: '20 18 17 20  '$ ?Temp: (!) 97.5 ?F (36.4 ?C) 98.2 ?F (36.8 ?C) 98.3 ?F (36.8 ?C) 98.5 ?F (36.9 ?C)  ?TempSrc: Oral Oral Oral Oral  ?SpO2: 98% 96% 94% 96%  ?Weight:      ?Height:      ? ?Gen: 71 y.o. male in no distress ?Pulm: Nonlabored breathing room air. Clear ?CV: Regular tachycardia. No murmur, rub, or gallop. No JVD, bilateral dependent edema. ?GI:  Abdomen soft, non-tender, non-distended, with normoactive bowel sounds.  ?Ext: Bilateral knees with large boggy prepatellar bursa that has overlying erythema and warmth. Pt does not appear to be in excruciating pain when palpated.  ?Skin: Possible vasculitic erythema found on RLE distally. Incomplete exam due to pt not cooperating this morning.  ?Neuro: Alert, not cooperative with exam. ?Psych: UTD ? ?Data  Personally reviewed: ?CBC: ?Recent Labs  ?Lab 01/07/22 ?0240 01/08/22 ?0244 01/10/22 ?1832 01/13/22 ?0355  ?WBC 25.1* 21.9* 20.5* 15.4*  ?NEUTROABS 23.2*  --   --  13.7*  ?HGB 10.3* 10.3* 10.3* 10.6*  ?HCT 29.3* 29.6* 30

## 2022-01-13 NOTE — Plan of Care (Signed)
  Problem: Activity: Goal: Ability to tolerate increased activity will improve Outcome: Progressing   Problem: Clinical Measurements: Goal: Ability to maintain a body temperature in the normal range will improve Outcome: Progressing   Problem: Respiratory: Goal: Ability to maintain adequate ventilation will improve Outcome: Progressing Goal: Ability to maintain a clear airway will improve Outcome: Progressing   

## 2022-01-13 NOTE — Progress Notes (Signed)
Pt noted to have increased AMS, alert oriented times person and time/date. Pt talking about is career in past times. Family at bedside with pt and is aware. IV started per MD orders and updated MD briefly during rounds. Will cont to monitor. SRP, RN ?

## 2022-01-14 ENCOUNTER — Encounter (HOSPITAL_COMMUNITY): Payer: Self-pay | Admitting: Internal Medicine

## 2022-01-14 ENCOUNTER — Inpatient Hospital Stay (HOSPITAL_COMMUNITY): Payer: Medicare Other

## 2022-01-14 DIAGNOSIS — J189 Pneumonia, unspecified organism: Secondary | ICD-10-CM | POA: Diagnosis not present

## 2022-01-14 DIAGNOSIS — E785 Hyperlipidemia, unspecified: Secondary | ICD-10-CM | POA: Diagnosis not present

## 2022-01-14 DIAGNOSIS — E871 Hypo-osmolality and hyponatremia: Secondary | ICD-10-CM | POA: Diagnosis not present

## 2022-01-14 DIAGNOSIS — N179 Acute kidney failure, unspecified: Secondary | ICD-10-CM | POA: Diagnosis not present

## 2022-01-14 LAB — CBC
HCT: 27.3 % — ABNORMAL LOW (ref 39.0–52.0)
HCT: 26.7 % — ABNORMAL LOW (ref 39.0–52.0)
Hemoglobin: 9.4 g/dL — ABNORMAL LOW (ref 13.0–17.0)
Hemoglobin: 9.4 g/dL — ABNORMAL LOW (ref 13.0–17.0)
MCH: 30.6 pg (ref 26.0–34.0)
MCH: 31 pg (ref 26.0–34.0)
MCHC: 34.4 g/dL (ref 30.0–36.0)
MCHC: 35.2 g/dL (ref 30.0–36.0)
MCV: 88.9 fL (ref 80.0–100.0)
MCV: 88.1 fL (ref 80.0–100.0)
Platelets: 595 10*3/uL — ABNORMAL HIGH (ref 150–400)
Platelets: 541 10*3/uL — ABNORMAL HIGH (ref 150–400)
RBC: 3.07 MIL/uL — ABNORMAL LOW (ref 4.22–5.81)
RBC: 3.03 MIL/uL — ABNORMAL LOW (ref 4.22–5.81)
RDW: 14.9 % (ref 11.5–15.5)
RDW: 14.8 % (ref 11.5–15.5)
WBC: 17.4 10*3/uL — ABNORMAL HIGH (ref 4.0–10.5)
WBC: 17.7 10*3/uL — ABNORMAL HIGH (ref 4.0–10.5)
nRBC: 0 % (ref 0.0–0.2)
nRBC: 0 % (ref 0.0–0.2)

## 2022-01-14 LAB — RENAL FUNCTION PANEL
Albumin: 2 g/dL — ABNORMAL LOW (ref 3.5–5.0)
Anion gap: 17 — ABNORMAL HIGH (ref 5–15)
BUN: 112 mg/dL — ABNORMAL HIGH (ref 8–23)
CO2: 17 mmol/L — ABNORMAL LOW (ref 22–32)
Calcium: 7.9 mg/dL — ABNORMAL LOW (ref 8.9–10.3)
Chloride: 99 mmol/L (ref 98–111)
Creatinine, Ser: 3.28 mg/dL — ABNORMAL HIGH (ref 0.61–1.24)
GFR, Estimated: 19 mL/min — ABNORMAL LOW (ref >60)
Glucose, Bld: 105 mg/dL — ABNORMAL HIGH (ref 70–99)
Phosphorus: 6.6 mg/dL — ABNORMAL HIGH (ref 2.5–4.6)
Potassium: 3.5 mmol/L (ref 3.5–5.1)
Sodium: 133 mmol/L — ABNORMAL LOW (ref 135–145)

## 2022-01-14 LAB — GLUCOSE, CAPILLARY
Glucose-Capillary: 116 mg/dL — ABNORMAL HIGH (ref 70–99)
Glucose-Capillary: 151 mg/dL — ABNORMAL HIGH (ref 70–99)
Glucose-Capillary: 83 mg/dL (ref 70–99)
Glucose-Capillary: 90 mg/dL (ref 70–99)

## 2022-01-14 LAB — AMMONIA: Ammonia: 12 umol/L (ref 9–35)

## 2022-01-14 LAB — OSMOLALITY: Osmolality: 294 mOsm/kg (ref 275–295)

## 2022-01-14 LAB — OSMOLALITY, URINE: Osmolality, Ur: 348 mOsm/kg (ref 300–900)

## 2022-01-14 IMAGING — US US ABDOMEN LIMITED
1 series · 8 of 8 positions shown · non-contrast
Comparison: Renal ultrasound [DATE]. MRI of the lumbar
spine[REDACTED]. Right upper quadrant ultrasound/[DATE].

CLINICAL DATA: Ascites.

EXAM:
LIMITED ABDOMEN ULTRASOUND FOR ASCITES
TECHNIQUE: Limited ultrasound survey for ascites was performed in all four
abdominal quadrants.

[Series 1: us abdomen limited · 8 of 8 slices shown]
[im 1/8]
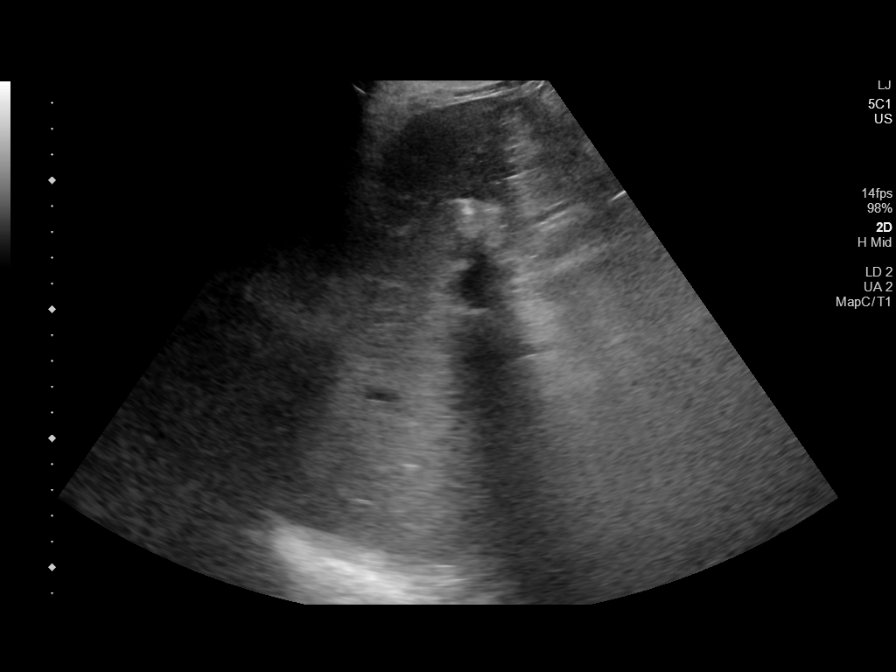
[im 2/8]
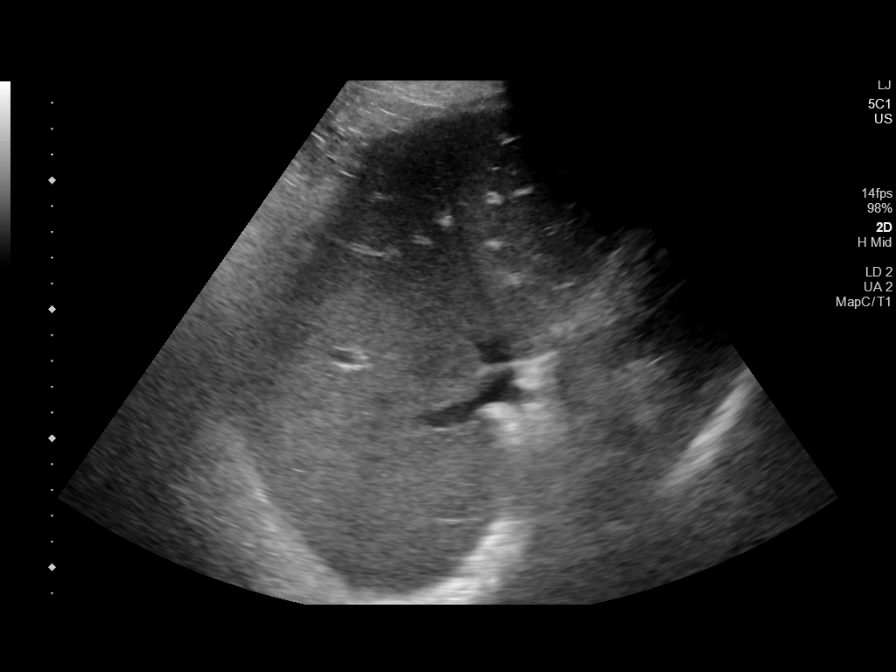
[im 3/8]
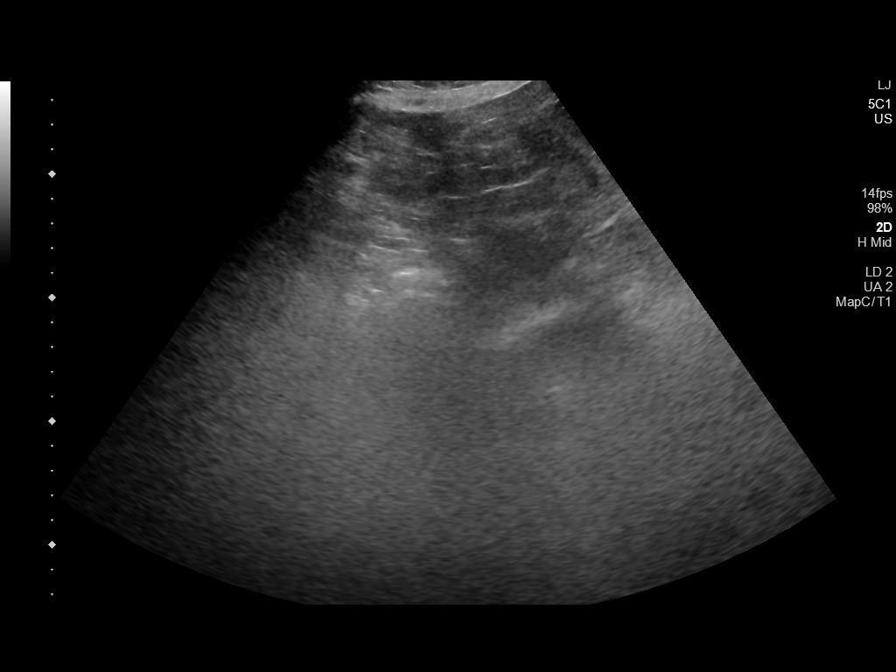
[im 4/8]
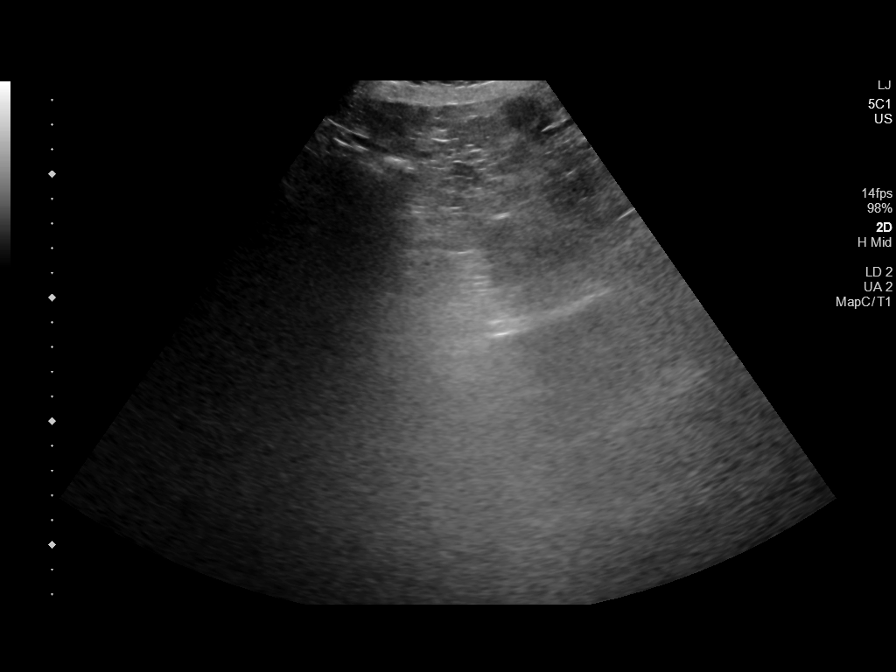
[im 5/8]
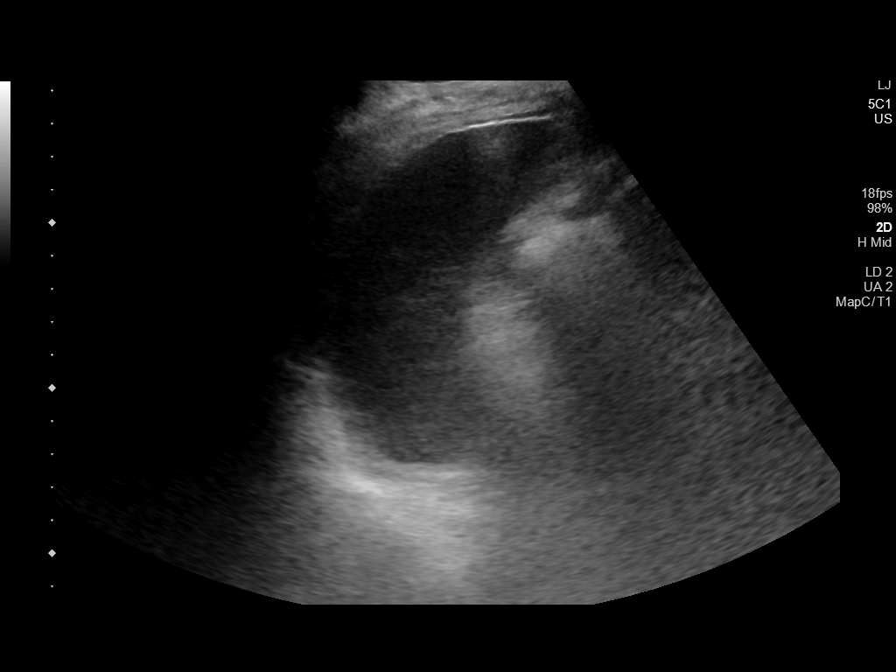
[im 6/8]
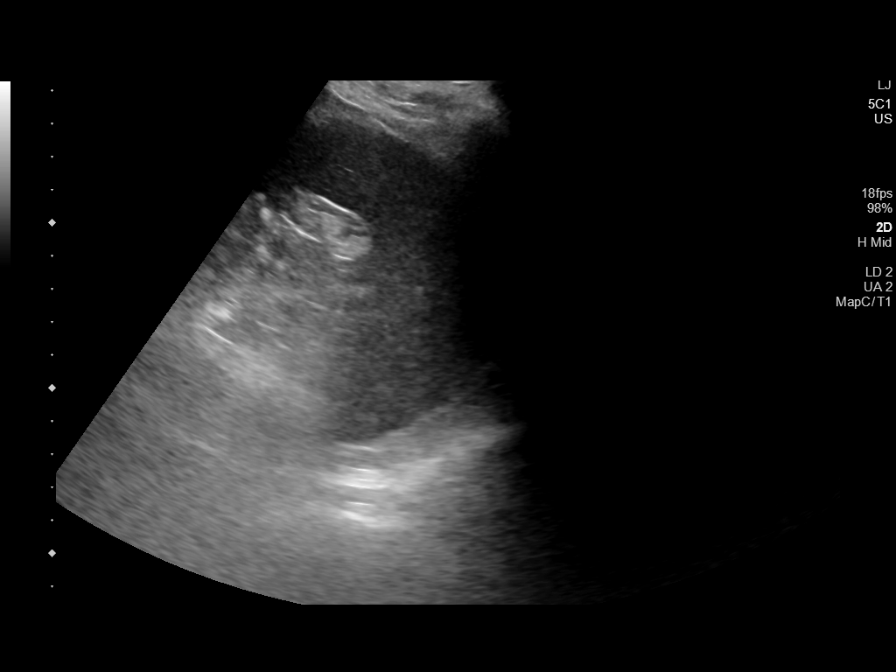
[im 7/8]
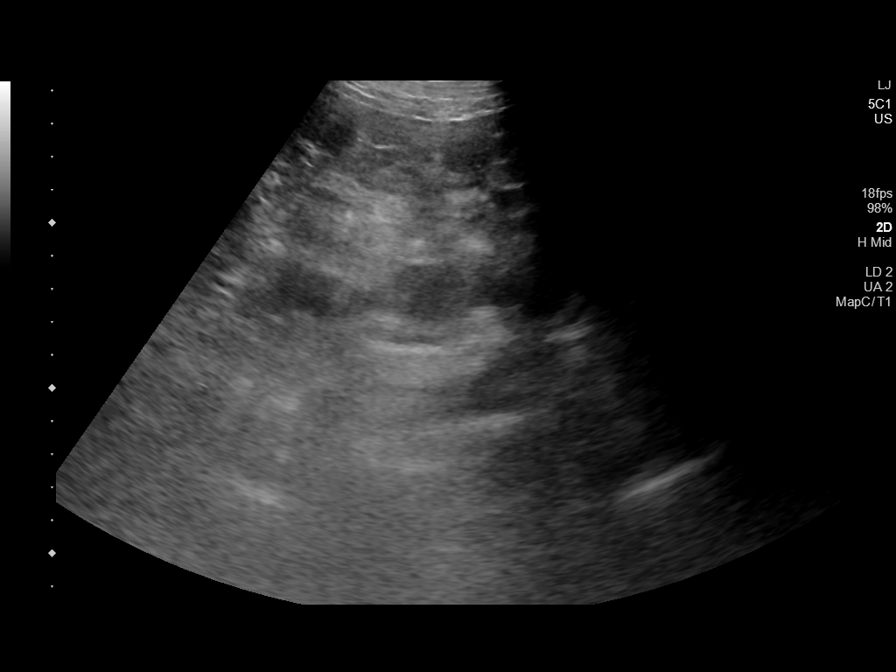
[im 8/8]
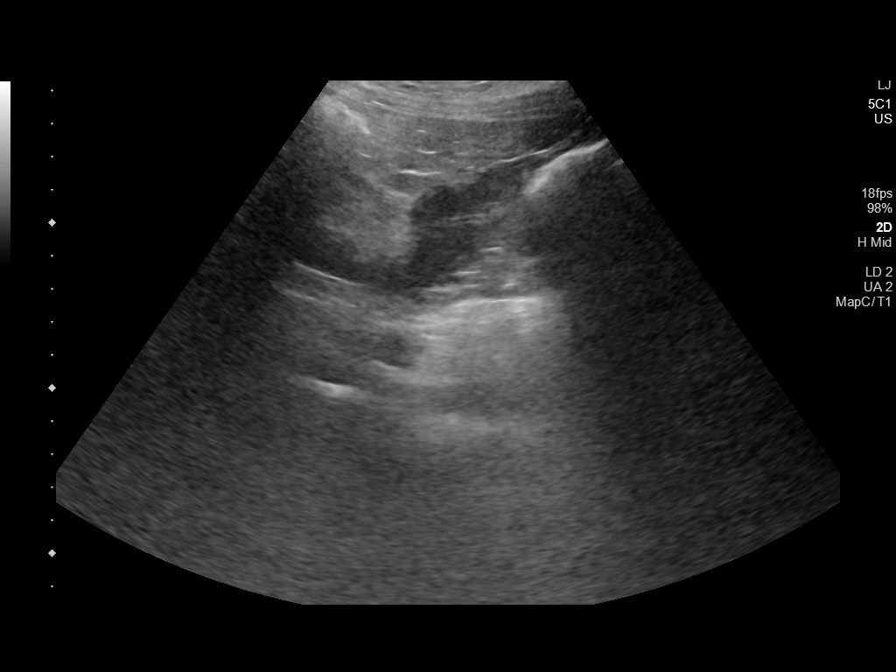

[8 of 8 positions shown; findings below may reference images not displayed]

FINDINGS: No significant abdominal ascites are present.
IMPRESSION: No significant abdominal ascites.

## 2022-01-14 IMAGING — US US RENAL
2 series · 14 of 25 positions shown · non-contrast
Comparison: None

CLINICAL DATA: Acute renal failure.

EXAM:
RENAL / URINARY TRACT ULTRASOUND COMPLETE

[Series 1: us renal · 13 of 85 slices shown (1 of 2)]
[im 1/85]
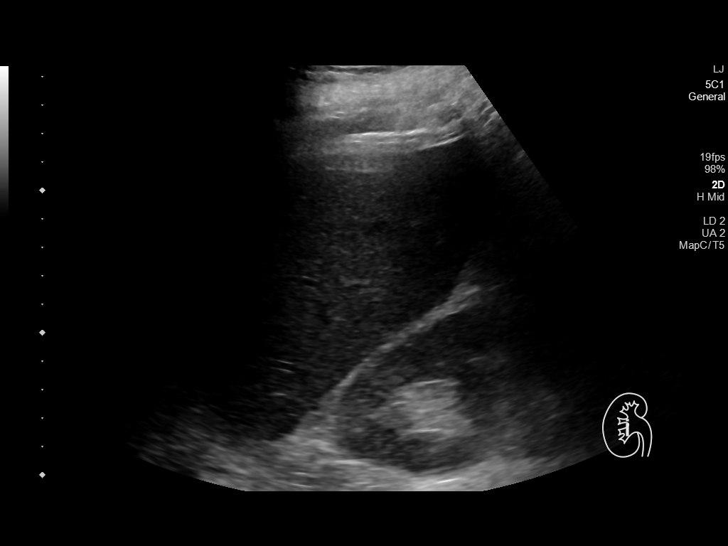
[im 8/85]
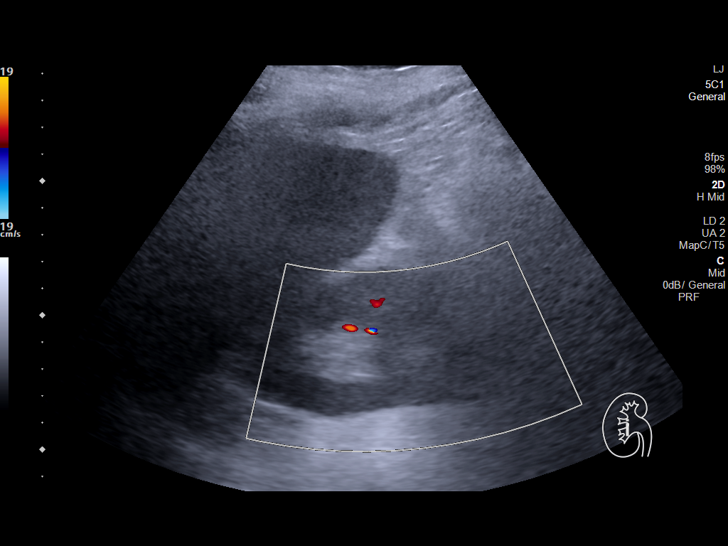
[im 15/85]
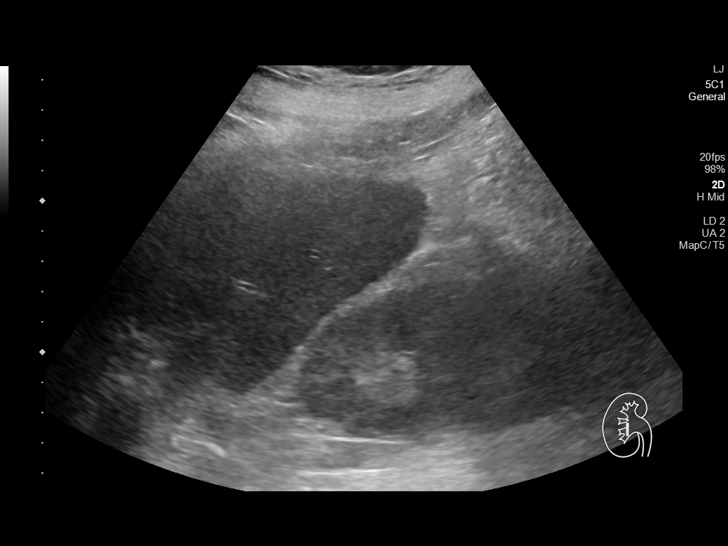
[im 22/85]
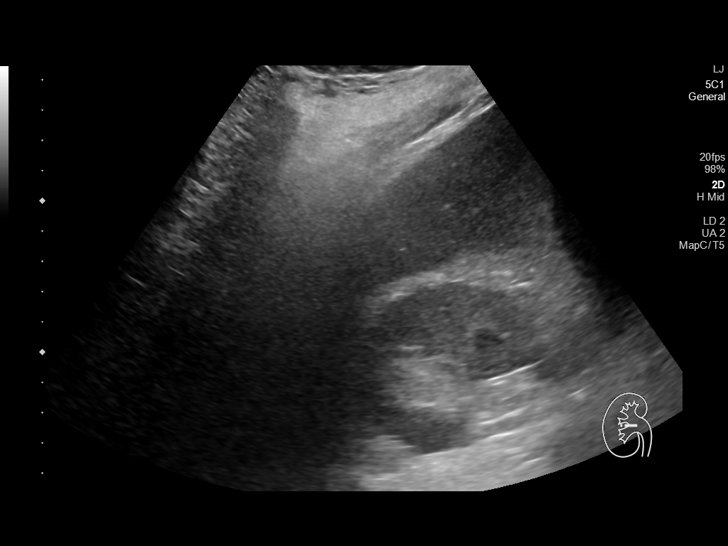
[im 30/85]
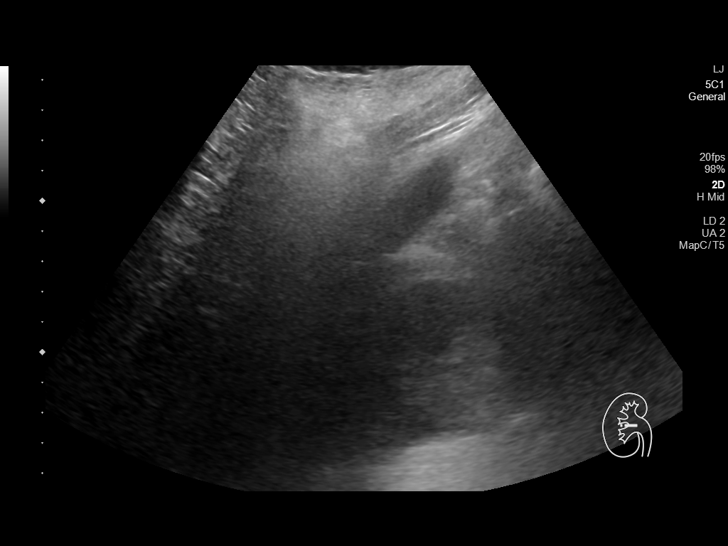
[im 33/85]
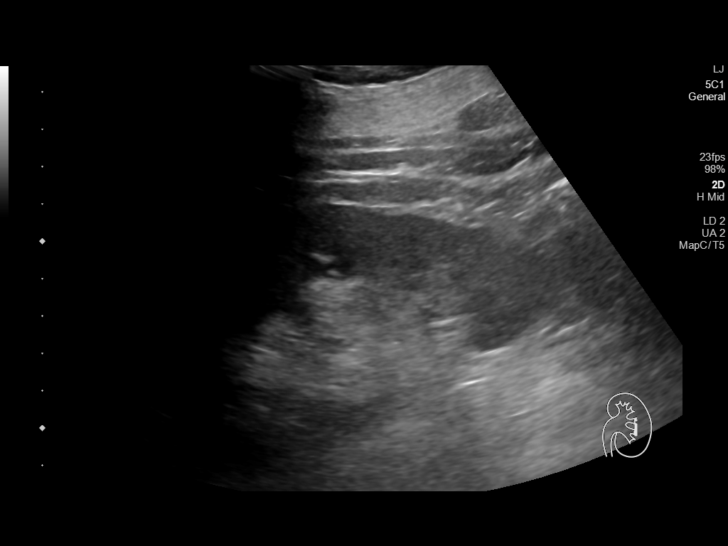
[im 41/85]
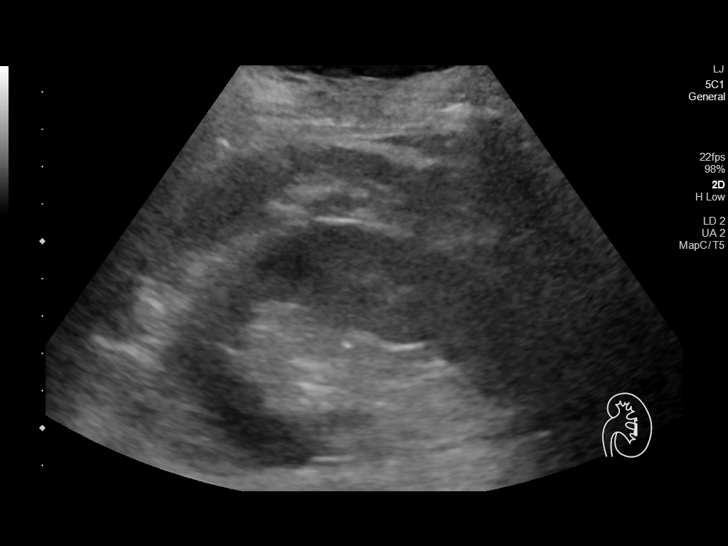
[im 48/85]
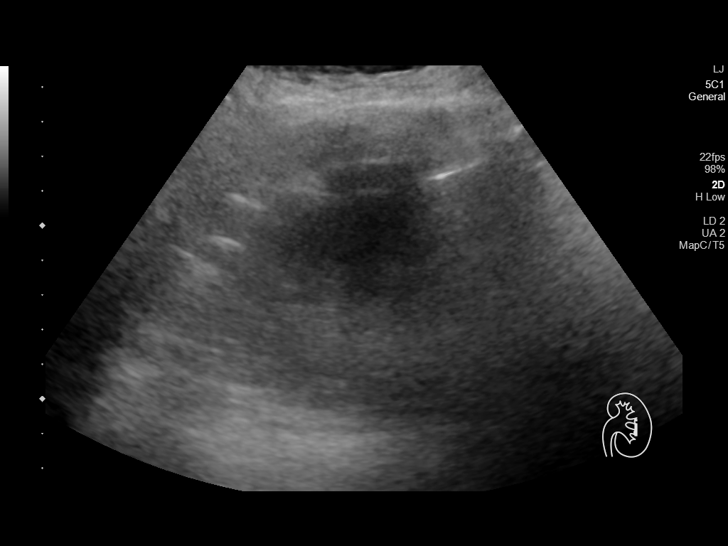
[im 55/85]
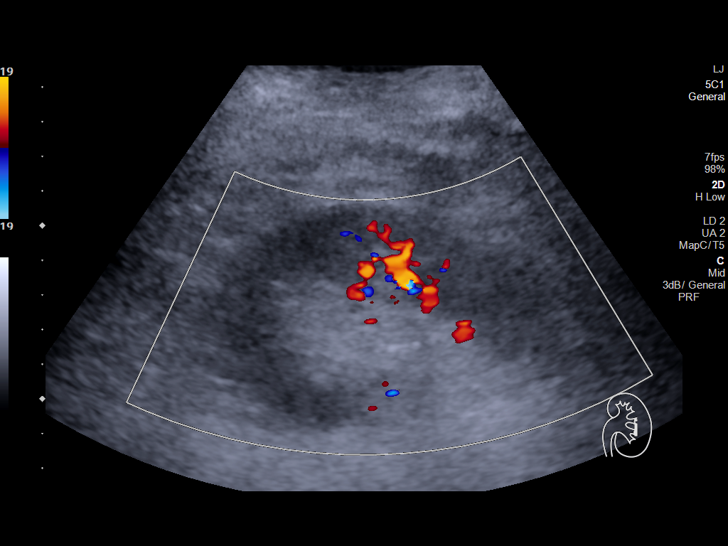
[im 59/85]
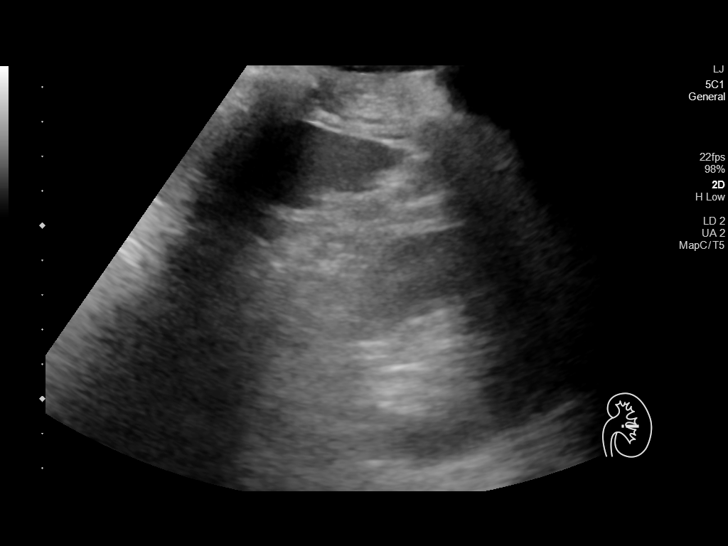
[im 66/85]
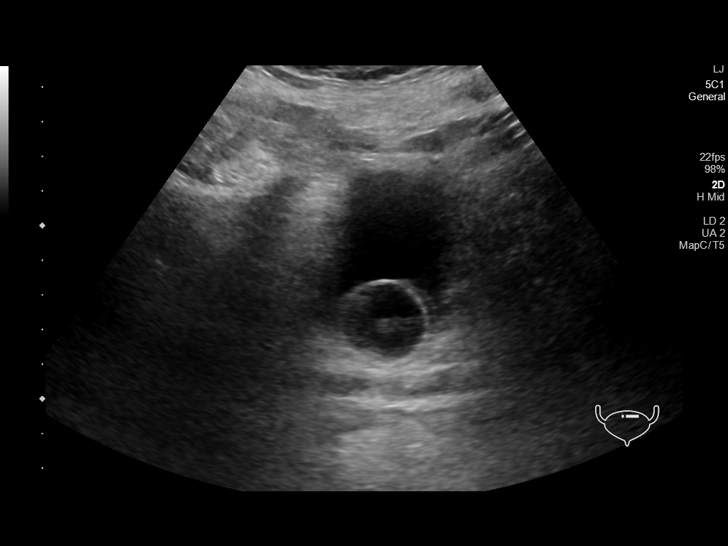
[im 74/85]
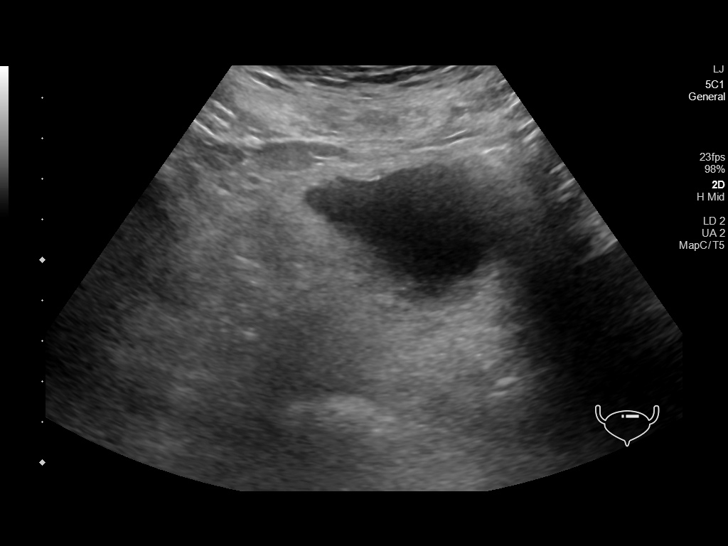
[im 81/85]
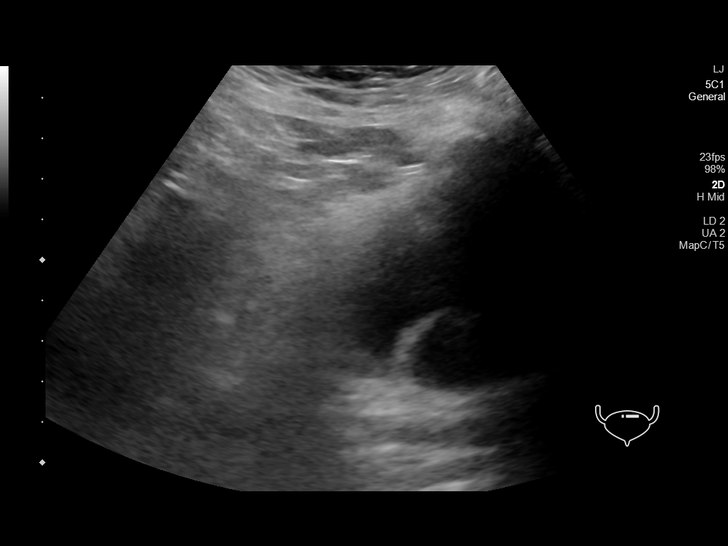

[Series 3: us renal · 1 of 1 slices shown (2 of 2)]
[im 1/1]
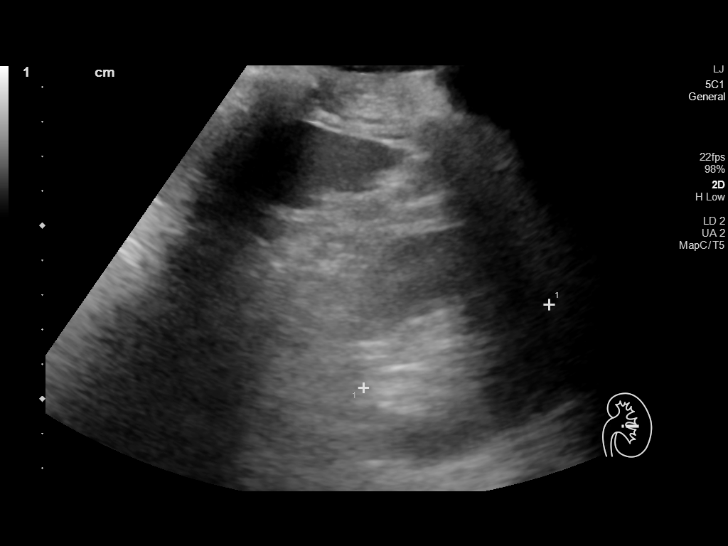

[14 of 25 positions shown; findings below may reference images not displayed]

FINDINGS: Right Kidney:

Renal measurements: 11.2 x 5.5 x 5.5 cm = volume: 179.5 mL.
Echogenicity within normal limits. No mass or hydronephrosis
visualized.

Left Kidney:

Renal measurements: 11.0 x 5.9 x 5.9 cm = volume: 200.3 mL.
Echogenicity within normal limits. No mass or hydronephrosis
visualized.

Bladder:

Appears normal for degree of bladder distention. Contains a Foley
catheter.

Other:

None.
IMPRESSION: Normal renal sonogram.

## 2022-01-14 MED ORDER — LIDOCAINE HCL 1 % IJ SOLN
INTRAMUSCULAR | Status: AC
  Filled 2022-01-14: qty 20

## 2022-01-14 MED ORDER — ALBUMIN HUMAN 25 % IV SOLN
25.0000 g | Freq: Two times a day (BID) | INTRAVENOUS | Status: DC
  Administered 2022-01-14 – 2022-01-15 (×3): 25 g via INTRAVENOUS
  Filled 2022-01-14 (×3): qty 100

## 2022-01-14 MED ORDER — THIAMINE HCL 100 MG/ML IJ SOLN
500.0000 mg | Freq: Every day | INTRAVENOUS | Status: DC
  Administered 2022-01-14 – 2022-01-15 (×2): 500 mg via INTRAVENOUS
  Filled 2022-01-14 (×2): qty 5

## 2022-01-14 MED ORDER — OLANZAPINE 5 MG PO TABS
2.5000 mg | ORAL_TABLET | Freq: Two times a day (BID) | ORAL | Status: DC
  Administered 2022-01-14 – 2022-01-15 (×3): 2.5 mg via ORAL
  Filled 2022-01-14 (×3): qty 1

## 2022-01-14 MED ORDER — CEFAZOLIN SODIUM-DEXTROSE 2-4 GM/100ML-% IV SOLN
2.0000 g | Freq: Two times a day (BID) | INTRAVENOUS | Status: DC
  Administered 2022-01-14 – 2022-01-15 (×2): 2 g via INTRAVENOUS
  Filled 2022-01-14 (×3): qty 100

## 2022-01-14 MED ORDER — THIAMINE HCL 100 MG/ML IJ SOLN: 100.0000 mg | INTRAMUSCULAR | Status: DC

## 2022-01-14 MED ORDER — SODIUM CHLORIDE 0.9 % IV SOLN: INTRAVENOUS | Status: DC

## 2022-01-14 NOTE — Progress Notes (Addendum)
?Progress Note ? ?Patient: Vincent White. ZOX:[id number] DOB: 1951-07-12  ?DOA: 01/04/2022  DOS: 01/14/2022  ?  ?Brief hospital course: ?Vincent White. is a 71 year old male with past medical history significant for essential pretension, hyperlipidemia, cervical spondylosis, gout, vitamin D deficiency, mitral valve insufficiency, pulmonary HTN, type 2 diabetes mellitus, OSA on CPAP who presented to [facility] ED on 4/6 via EMS with generalized weakness, fatigue, poor oral intake and inability to ambulate over the last 2-3 weeks. He was admitted for sepsis, ultimately growing MSSA in urine and blood cultures. There were no vegetations on TTE, though repeat blood cultures were persistently positive. TEE will be performed, and blood cultures repeated 4/13 remain NGTD. Podiatry was consulted for a necrotic toe, and performed partial amputation of the right second toe 4/8. Subsequent investigation revealed a right lateral foot abscess for which bedside I&D was performed 4/12.  ? ?The patient's mood appears to be declining. Psychiatry was consulted and started cymbalta with no current concern for SI. ? ?Due to patient reporting back pain, MRI of the lumbar spine was performed revealing epidural abscess seen to be discitis-osteomyelitis at many levels of thoracic spine with paravertebral phlegmon/abscess and epidural abscess at T11. Neurosurgery is consulted.   ? ?Developing AKI again after trial off IVF. Restarted IVF + albumin, Cr worse.  ? ?Mental status is also worsening.  ? ?Assessment and Plan: ?Severe sepsis due to MSSA bacteremia with evidence of multifocal seeding of infection including UTI (+urine culture), thoracic epidural abscess, possible septic knee(s), right 2nd toe osteomyelitis (pathology report w/acute osteomyelitis, negative margins, +MSSA on culture 4/8) with refractory positive blood cultures (4/6, 4/8 (1 of 2), 4/10 (1 of 2)). ?- Follow repeat blood cultures from 4/12, NGTD x4 days thus far. Leukocytosis is  improved further today to 15k. ?- Continue ancef.  ?- I discussed with cardiology that the patient will need TEE. Note TTE was read with poor quality images. AV not well visualized. ?- Sepsis physiology has resolved. Midodrine was initiated but since discontinued.   ? ?Gouty tophi of knees: Dry aspiration without purulence by orthopedics 4/14. No concern for septic joint at this time.  ? 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  ?- Neurosurgery, Vincent White, consulted and will review images. Part of patient's primary complaint on admission was leg weakness leading to inability to walk. His exam is dubious due to inability to consistently follow commands and limited participation due to pain, but I cannot say that he has strength in his legs at this time.  ? ?Lateral right foot abscess: s/p bedside I&D by podiatry 4/12.  ? ?Acute renal failure: Cr 2.2 on admission, multifactorial. s/p IV albumin and midodrine with hx cirrhosis and possible hepatorenal syndrome. Renal function ultimately improved but has begun trending upward again.  ?- Pt's family reports severely inadequate po intake and he continues to have insensible losses, so restarted IVF w/albumin though renal function worsening, continues to have significant third-spacing. We'll DC IVF until nephrology, I have consulted Vincent White can evaluate today. Renal U/S ordered. FENa is low and Pr:Cr is 1.6. ?if albumin + lasix should be tried.  ?- ?hepatorenal ?- Continue holding metformin, losartan.  ? ?Acute encephalopathy. Major depressive disorder: Recurrent at this time. Worsened by pain per pt. No active SI.  ?- Psych consulted 4/12, started cymbalta '20mg'$ . He's grown more hyperactive/delirious, restless.  ?- I've requested psychiatry repeat evaluation  today.  ?- Will d/w ID ?encephalitis empiric Tx?  Not sure LP would be possible with abscesses. ?if empiric SBP coverage would be helpful. Diagnostic paracentesis ordered. ?- No asterixis and pt not lethargic, though hepatic encephalopathy and uremic encephalopathy are on differential for this possible multifactorial AMS. Ammonia add-on requested. ?- Protracted alcohol cessation will be helpful as well. Some of these symptoms could be subacute withdrawal. Will augment thiamine to '500mg'$  IV x3 days empirically. ? ?Hepatic cirrhosis: Possibly alcoholic vs. NASH. INR elevated and albumin very low consistent with impaired synthetic function. Hepatitis panel negative, HIV NR. LFTs improved ? ?Gout: Pt had suspicion that knee swelling as gout flare. XR with small joint effusions but marked prepatellar swelling. Uric acid is 8.7.  ?- Continue probenecid, colchicine empirically.  ?- Ortho consulted, bedside aspiration was dry on 4/14. ?- Pain control as ordered. ? ?Lower extremity edema, actually appears more to be anasarca: LVEF 60-65%, no RWMA, indeterminate diastolic function. Normal IVC. Echo 2021 has normal diastolic function. No DVT seen on venous U/S. ?- Suspect third-spacing/swelling due to liver disease. Given albumin previously during hospitalization. Appears extravascularly overloaded, though renal impairment suggests intravascular depletion. Will give albumin and IVF today and monitor closely.  ?  ?Reactive thrombocytosis: Noted.  ? ?Hyponatremia: Improved at goal rate after admission from nadir of 118. FENa at that time clearly prerenal and has since improved to very mild, generally in low 130's. Given its persistence, suspect this is related to cirrhosis.   ?- Doubt this level of sodium abnormality is affecting mental status significantly.  ? ?Acute urinary retention: s/p foley 4/7 ?- Continue foley for now. ?  ?Alcohol abuse: No evidence of significant withdrawal.  ?- Continue empiric thiamine, folate, MVM. Augment thiamine x3 days as above. ?- Cessation  counseling has been discussed at significant length.  ? ?History of opioid dependence: Has been weaned, is known by family to have had compulsive behaviors obtaining opioids in the past.  ?  ?T2DM: Well-controlled with HbA1c 6.3%.  ?- Hold metformin ?- Continue moderate SSI, remains at inpatient goal. Continue regular diet given good control and need to increase po intake. ?  ?HTN:  ?- Continuing norvasc '10mg'$  home med (could consider [place] this to see what contribution it's having to LE edema). Normotensive. ?- Holding losartan due to AKI.   ?  ?Normocytic anemia: Stable ? ?HLD:  ?- Holding statin with LFT elevations ? ?OSA:  ?- CPAP qHS ? ?Stage 2 pressure injuries to bilateral buttocks: This is NOT believed to have been POA.  ?- Foam in place, needs offloading. Ideally would ambulate. Hopefully orthopedics can assist with this. ? ?Obesity: Estimated body mass index is 30.74 kg/m? as calculated from the following: ?  Height as of this encounter: '5\' 6"'$  (1.676 m). ?  Weight as of this encounter: 86.4 kg. ? ?Subjective: Pt did not sleep last night, fever to 100.6?F. He continues to have speech that does not have relation to questioning, did not sleep/restless overnight. Was feeding himself last week, not eating or drinking currently. Will not answers directed specifically toward pain or anything else.   ? ?Objective: ?Vitals:  ? 01/13/22 2025 01/13/22 2305 01/14/22 0025 01/14/22 0459  ?BP: 112/79   124/71  ?Pulse: (!) 105 (!) 103 100 (!) 103  ?Resp: 20   20  ?Temp: (!) 100.6 ?F (38.1 ?C) 99.1 ?F (37.3 ?C) 98.4 ?F (36.9 ?C) 99.1 ?F (37.3 ?C)  ?TempSrc: Oral Oral Oral Oral  ?SpO2: 95% 96% 97% 96%  ?  Weight:      ?Height:      ? ?Gen: 71 y.o. male in no distress ?Pulm: Nonlabored breathing room air. Clear anterolaterally. ?CV: Regular rate and rhythm in 90's. No new murmur, rub, or gallop. No JVD. Diffuse anasarca is a bit worse today. ?GI: Abdomen soft, protuberant, not specifically tender, non-distended, with  normoactive bowel sounds.  ?Ext: Warm, dry, edematous with boggy knees that are stable. R foot dressing is c/d/i ?Skin: No new rashes, lesions or ulcers on visualized skin. ?Neuro: Alert and completely disoriented. No

## 2022-01-14 NOTE — Plan of Care (Signed)
  Problem: Activity: Goal: Ability to tolerate increased activity will improve Outcome: Progressing   Problem: Clinical Measurements: Goal: Ability to maintain a body temperature in the normal range will improve Outcome: Progressing   Problem: Respiratory: Goal: Ability to maintain adequate ventilation will improve Outcome: Progressing Goal: Ability to maintain a clear airway will improve Outcome: Progressing   

## 2022-01-14 NOTE — Progress Notes (Signed)
Pt en-route to [place] for paracentesis, Family at bedside, wife transporting with patient to provide informed consent, as pt is confused, unable consent. SRP, RN ?

## 2022-01-14 NOTE — Progress Notes (Signed)
PHARMACY NOTE:  ANTIMICROBIAL RENAL DOSAGE ADJUSTMENT ? ?Current antimicrobial regimen includes a mismatch between antimicrobial dosage and estimated renal function.  As per policy approved by the Pharmacy & Therapeutics and Medical Executive Committees, the antimicrobial dosage will be adjusted accordingly. ? ?Current antimicrobial dosage:  Cefazolin 2g IV q8h ? ?Indication: MSSA bacteremia ? ?Renal Function: ? ?Estimated Creatinine Clearance: 21.6 mL/min (A) (by C-G formula based on SCr of 3.28 mg/dL (H)). ?'[]'$      On intermittent HD, scheduled: ?'[]'$      On CRRT ?   ?Antimicrobial dosage has been changed to:  2g IV q12h ? ?Additional comments: ? ? ?Thank you for allowing pharmacy to be a part of this patient's care. ? ?Peggyann Juba, PharmD, BCPS ?Pharmacy: [phone] ?01/14/2022 8:08 AM ? ? ?  ? ?

## 2022-01-14 NOTE — Consult Note (Addendum)
Renal Service ?Consult Note ?[facility] ? ?[name] ?01/14/2022 ?Sol Blazing, MD ?Requesting Physician: Dr. Bonner Puna ? ?Reason for Consult: Renal failure ?HPI: The patient is a 71 y.o. year-old w/ hx of gout, HL, HTN, etoh abuse, kidney stones, OSA, DM2 presented for gen weakness, inability to ambulate on 4/06. He was admitted and cx's grew MSSA in urine and blood. Podiatry did partial amputation for gangrene of R 2nd toe, and on 4/12 pt had I&D of a lateral foot abscess. Also MRI showed multi-level discitis/ osteomyelitis and epidural abscess of the thoracic spine that has been treated w/o surgery. Creat was 1.8 on admission 4/07, improved to 0.9 on 4/10, now has worsened up to 2.6 on 4/15 and 3.28 today. UOP has been dropping off as well and pt has been confused the last 2 days approx per the family. Asked to see for renal failure.  ? ?Pt seen in room.  Pt is confused and family provides hx. Pt has been a high-functioning alcoholic for many years, working at [employer], designing complicated structures per the dtr.  No hx renal failure.  ? ? ?ROS - n/a ? ?Past Medical History  ?Past Medical History:  ?Diagnosis Date  ? Cervical spondylosis   ? Gout   ? History of kidney stones   ? HLD (hyperlipidemia)   ? Hypertension   ? OSA (obstructive sleep apnea)   ? CPAP  ? Vitamin D deficiency   ? ?Past Surgical History  ?Past Surgical History:  ?Procedure Laterality Date  ? AMPUTATION TOE Right 01/06/2022  ? Procedure: AMPUTATION TOE;  Surgeon: Criselda Peaches, DPM;  Location: [facility];  Service: Podiatry;  Laterality: Right;  ? bone spur surgery  02/05/2017  ? mass removed on left elbow  ? BUNIONECTOMY Right 2014  ? SHOULDER SURGERY Right   ? thumb surgery Right   ? VASECTOMY    ? ?Family History  ?Family History  ?Problem Relation Age of Onset  ? Cancer - Other Mother   ?     bone  ? Diabetes Mother   ? Heart attack Father 55  ? Diabetes Father   ? Hypertension Father   ? Cancer - Other Sister   ?      breast  ? Gout Brother   ? Hypertension Maternal Grandmother   ? Hypertension Maternal Grandfather   ? Colon cancer Neg Hx   ? Liver disease Neg Hx   ? Kidney disease Neg Hx   ? ?Social History  reports that he has never smoked. He has never used smokeless tobacco. He reports current alcohol use of about 1.0 standard drink per week. He reports that he does not use drugs. ?Allergies  ?Allergies  ?Allergen Reactions  ? Allopurinol Other (See Comments)  ?  Dehydrated   ? Flexeril [Cyclobenzaprine] Other (See Comments)  ?  confusion  ? Nasal Spray Other (See Comments)  ?  Elevated BP  ? ?Home medications ?Prior to Admission medications   ?Medication Sig Start Date End Date Taking? Authorizing Provider  ?acetaminophen (TYLENOL) 500 MG tablet Take 1,000 mg by mouth every 6 (six) hours as needed for mild pain.   Yes [provider]  ?amLODipine (NORVASC) 10 MG tablet Take 10 mg by mouth daily. 05/09/18  Yes [provider]  ?atorvastatin (LIPITOR) 40 MG tablet TAKE 1 TABLET (40 MG TOTAL) BY MOUTH DAILY. PLEASE SCHEDULE OFFICE VISIT FOR REFILLS. 09/26/21  Yes Minus Breeding, MD  ?Celecoxib (CELEBREX PO) Take 200  mg by mouth daily as needed (pain).   Yes [provider]  ?Cholecalciferol 2000 units CAPS Take 2,000 Units by mouth daily.   Yes [provider]  ?losartan (COZAAR) 100 MG tablet Take 100 mg by mouth at bedtime. 06/01/18  Yes [provider]  ?metFORMIN (GLUCOPHAGE) 500 MG tablet Take 500 mg by mouth 2 (two) times daily with a meal.   Yes [provider]  ?Sodium Bicarbonate-Citric Acid (ALKA-SELTZER HEARTBURN) 1940-1000 MG TBEF Take 2 tablets by mouth every 6 (six) hours as needed (heartburn).   Yes [provider]  ?aspirin 81 MG tablet Take 81 mg by mouth daily. ?Patient not taking: Reported on 01/04/2022    [provider]  ?cyclobenzaprine (FLEXERIL) 5 MG tablet Take 5 mg by mouth at bedtime as needed for muscle spasms. ?Patient not taking:  Reported on 01/04/2022 12/27/21   [provider]  ?HYDROcodone-acetaminophen (NORCO) 10-325 MG per tablet Take 1 tablet by mouth 2 (two) times daily. ?Patient not taking: Reported on 01/04/2022    [provider]  ? ? ? ?Vitals:  ? 01/13/22 2305 01/14/22 0025 01/14/22 0459 01/14/22 0855  ?BP:   124/71 120/71  ?Pulse:  100 (!) 103 92  ?Resp:   20 (!) 22  ?Temp:  98.4 ?F (36.9 ?C) 99.1 ?F (37.3 ?C) 98.8 ?F (37.1 ?C)  ?TempSrc: Oral Oral Oral   ?SpO2:  97% 96% 97%  ?Weight:      ?Height:      ? ?Exam ?Gen alert, no distress, perseverating, awake ?No rash, cyanosis or gangrene ?Sclera anicteric, throat clear and dry ?No jvd or bruits ?Chest clear bilat to bases ?RRR no MRG ?Abd soft ntnd no mass or ascites +bs ?GU normal male w/ foley draining dark amber urine ?MS no joint effusions or deformity ?Ext no LE or UE edema, no wounds or ulcers ?Neuro is confused, moves all ex ? ? ? ? ? Home meds include - losartan, asa, norco, metformin, celebrex, lipitor, norvasc, prns ? ? ? ? ?Assessment/ Plan: ?Renal failure - in patient w/ MSSA bacteremia, alcoholic w/ probable cirrhosis, foot and spinal infections. No hx renal failure, no old labs in the system. Admit creat of 1.8 improved to 0.9, now AKI w/ dropping UOP and creat up to 3.2.  This is concerning w/ dx of cirrhosis. Low UNa in conjunction edema is concerning for HRS.  Lasix will not help. May have some intravasc vol depletion (dry mouth, net I/O - 9L), will resume NS at 75 cc/hr. Restart IV alb for 1-2 days. Midodrine/ octreotide not needed since MAP's aren't low. Will follow.  ?[place] - prognosis is guarded. Pt would be a poor HD candidate given comorbidities, have d/w sister/ dtr. They would not want him to have to go through heroic measures if prognosis is very poor. They say he wouldn't want that either.  ?MSSA bacteremia - w/ bilat foot infection, and multilevel spine infection. Per pmd.  ?AMS - not sure cause, has multi-organ failure at this point.   ?Cirrhosis - hx of etoh abuse ?Hyponatremia - appears to have resolved ?Etoh abuse ?BP - have dc'd norvasc, avoid BP lowering meds in this pt ?  ? ? ? ?Kelly Splinter  MD ?01/14/2022, 12:12 PM ?Recent Labs  ?Lab 01/10/22 ?1832 01/13/22 ?0355 01/14/22 ?8338 01/14/22 ?1115  ?HGB 10.3* 10.6* 9.4* 9.4*  ?ALBUMIN 2.2* 2.0* 2.0*  --   ?CALCIUM 8.3* 8.3* 7.9*  --   ?PHOS 4.0  --  6.6*  --   ?  CREATININE 1.51* 2.63* 3.28*  --   ?K 3.8 3.5 3.5  --   ? ? ?

## 2022-01-14 NOTE — Consult Note (Signed)
[facility] Face-to-Face Psychiatry Consult  ? ?Reason for Consult: Patient is delirious, seems more agitated with the Cymbalta ?Referring Physician: Hospitalist ?Patient Identification: Vincent White. ?MRN:  [id number] ?Principal Diagnosis: Hyponatremia ?Diagnosis:  Principal Problem: ?  Hyponatremia ?Active Problems: ?  Essential hypertension ?  Dyslipidemia ?  Type 2 diabetes mellitus with hyperglycemia (HCC) ?  Gout ?  AKI (acute kidney injury) ([place]) ?  Transaminitis ?  CAP (community acquired pneumonia) ?  OSA (obstructive sleep apnea) ?  Injury of right second toe ?  Osteomyelitis of second toe of right foot ([place]) ?  Pressure injury of skin ?  MSSA bacteremia ?  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  Major depressive disorder, recurrent episode, moderate ([place]) ? ? ?Total Time spent with patient: 30 minutes ? ?Subjective: Patient per staff and daughter has not slept for 24 hours, is talking about snakes, big snakes eating eggs.  Daughter reports that he did not recognize her this morning, during evaluation, patient was able to state where he was, did not know date, month or year, did recognize his daughter. ? ?Daughter reports that patient did have an alcohol use disorder, under played his drinking but reports that his presentation has changed in the last 24 hours. ? ?She states that patient has never been in this condition, feels it is because of him having an infection.  She reports that he is does struggle with his mood, mainly depression but not have any psychiatric history. ?  ? ?HPI:  Vincent Whiteis a 71 y.o. male patient admitted with hyponatremia, gout, arthritis, cervical spondylosis, mitral valve insufficiency, pulmonary hypertension and type 2 diabetes ? ?Past Psychiatric History: Unchanged from previous visit ? ?Risk to Self:   ?Risk to Others:   ?Prior Inpatient Therapy:   ?Prior Outpatient Therapy:   ? ?Past Medical History:  ?Past Medical History:  ?Diagnosis Date  ? Cervical spondylosis   ?  Gout   ? History of kidney stones   ? HLD (hyperlipidemia)   ? Hypertension   ? OSA (obstructive sleep apnea)   ? CPAP  ? Vitamin D deficiency   ?  ?Past Surgical History:  ?Procedure Laterality Date  ? AMPUTATION TOE Right 01/06/2022  ? Procedure: AMPUTATION TOE;  Surgeon: Criselda Peaches, DPM;  [facility];  Service: Podiatry;  Laterality: Right;  ? bone spur surgery  02/05/2017  ? mass removed on left elbow  ? BUNIONECTOMY Right 2014  ? SHOULDER SURGERY Right   ? thumb surgery Right   ? VASECTOMY    ? ?Family History:  ?Family History  ?Problem Relation Age of Onset  ? Cancer - Other Mother   ?     bone  ? Diabetes Mother   ? Heart attack Father 3  ? Diabetes Father   ? Hypertension Father   ? Cancer - Other Sister   ?     breast  ? Gout Brother   ? Hypertension Maternal Grandmother   ? Hypertension Maternal Grandfather   ? Colon cancer Neg Hx   ? Liver disease Neg Hx   ? Kidney disease Neg Hx   ? ?Family Psychiatric  History: Unchanged from previous assessment ?Social History:  ?Social History  ? ?Substance and Sexual Activity  ?Alcohol Use Yes  ? Alcohol/week: 1.0 standard drink  ? Types: 1 Standard drinks or equivalent per week  ?   ?Social History  ? ?Substance and Sexual Activity  ?Drug Use Never  ?  ?Social History  ? ?  Socioeconomic History  ? Marital status: Divorced  ?  Spouse name: Not on file  ? Number of children: 1  ? Years of education: Not on file  ? Highest education level: Not on file  ?Occupational History  ? Occupation: retired Programmer, systems  ?Tobacco Use  ? Smoking status: Never  ? Smokeless tobacco: Never  ?Vaping Use  ? Vaping Use: Never used  ?Substance and Sexual Activity  ? Alcohol use: Yes  ?  Alcohol/week: 1.0 standard drink  ?  Types: 1 Standard drinks or equivalent per week  ? Drug use: Never  ? Sexual activity: Not on file  ?Other Topics Concern  ? Not on file  ?Social History Narrative  ? Not on file  ? ?Social Determinants of Health  ? ?Financial Resource Strain: Not on file   ?Food Insecurity: Not on file  ?Transportation Needs: Not on file  ?Physical Activity: Not on file  ?Stress: Not on file  ?Social Connections: Not on file  ? ?Additional Social History: ?  ? ?Allergies:   ?Allergies  ?Allergen Reactions  ? Allopurinol Other (See Comments)  ?  Dehydrated   ? Flexeril [Cyclobenzaprine] Other (See Comments)  ?  confusion  ? Nasal Spray Other (See Comments)  ?  Elevated BP  ? ? ?Labs:  ?Results for orders placed or performed during the hospital encounter of 01/04/22 (from the past 48 hour(s))  ?Protime-INR     Status: Abnormal  ? Collection Time: 01/12/22  3:55 PM  ?Result Value Ref Range  ? Prothrombin Time 45.6 (H) 11.4 - 15.2 seconds  ? INR 4.9 (HH) 0.8 - 1.2  ?  Comment: CRITICAL RESULT CALLED TO, READ BACK BY AND VERIFIED WITH: ?Joannie Springs, RN ?(NOTE) ?INR goal varies based on device and disease states. ?Performed at [facility], [address] ?[place], [place] [address] ?  ?Glucose, capillary     Status: Abnormal  ? Collection Time: 01/12/22  5:31 PM  ?Result Value Ref Range  ? Glucose-Capillary 108 (H) 70 - 99 mg/dL  ?  Comment: Glucose reference range applies only to samples taken after fasting for at least 8 hours.  ? Comment 1 Notify RN   ?Glucose, capillary     Status: Abnormal  ? Collection Time: 01/12/22  8:56 PM  ?Result Value Ref Range  ? Glucose-Capillary 143 (H) 70 - 99 mg/dL  ?  Comment: Glucose reference range applies only to samples taken after fasting for at least 8 hours.  ?CBC with Differential/Platelet     Status: Abnormal  ? Collection Time: 01/13/22  3:55 AM  ?Result Value Ref Range  ? WBC 15.4 (H) 4.0 - 10.5 K/uL  ? RBC 3.57 (L) 4.22 - 5.81 MIL/uL  ? Hemoglobin 10.6 (L) 13.0 - 17.0 g/dL  ? HCT 31.4 (L) 39.0 - 52.0 %  ? MCV 88.0 80.0 - 100.0 fL  ? MCH 29.7 26.0 - 34.0 pg  ? MCHC 33.8 30.0 - 36.0 g/dL  ? RDW 14.7 11.5 - 15.5 %  ? Platelets 614 (H) 150 - 400 K/uL  ? nRBC 0.0 0.0 - 0.2 %  ? Neutrophils Relative % 89 %  ? Neutro Abs 13.7  (H) 1.7 - 7.7 K/uL  ? Lymphocytes Relative 5 %  ? Lymphs Abs 0.7 0.7 - 4.0 K/uL  ? Monocytes Relative 4 %  ? Monocytes Absolute 0.6 0.1 - 1.0 K/uL  ? Eosinophils Relative 0 %  ? Eosinophils Absolute 0.0 0.0 - 0.5 K/uL  ?  Basophils Relative 0 %  ? Basophils Absolute 0.0 0.0 - 0.1 K/uL  ? Immature Granulocytes 2 %  ? Abs Immature Granulocytes 0.31 (H) 0.00 - 0.07 K/uL  ?  Comment: Performed at [facility], [place] [address] [place], [place] [address]  ?Comprehensive metabolic panel     Status: Abnormal  ? Collection Time: 01/13/22  3:55 AM  ?Result Value Ref Range  ? Sodium 131 (L) 135 - 145 mmol/L  ? Potassium 3.5 3.5 - 5.1 mmol/L  ? Chloride 96 (L) 98 - 111 mmol/L  ? CO2 20 (L) 22 - 32 mmol/L  ? Glucose, Bld 108 (H) 70 - 99 mg/dL  ?  Comment: Glucose reference range applies only to samples taken after fasting for at least 8 hours.  ? BUN 90 (H) 8 - 23 mg/dL  ? Creatinine, Ser 2.63 (H) 0.61 - 1.24 mg/dL  ? Calcium 8.3 (L) 8.9 - 10.3 mg/dL  ? Total Protein 6.4 (L) 6.5 - 8.1 g/dL  ? Albumin 2.0 (L) 3.5 - 5.0 g/dL  ? AST 79 (H) 15 - 41 U/L  ? ALT 7 0 - 44 U/L  ? Alkaline Phosphatase 158 (H) 38 - 126 U/L  ? Total Bilirubin 1.1 0.3 - 1.2 mg/dL  ? GFR, Estimated 25 (L) >60 mL/min  ?  Comment: (NOTE) ?Calculated using the CKD-EPI Creatinine Equation (2021) ?  ? Anion gap 15 5 - 15  ?  Comment: Performed at [facility], [place] [address], [place], [place] [address]  ?Vitamin B12     Status: Abnormal  ? Collection Time: 01/13/22  3:55 AM  ?Result Value Ref Range  ? Vitamin B-12 2,022 (H) 180 - 914 pg/mL  ?  Comment: RESULTS CONFIRMED BY MANUAL DILUTION ?(NOTE) ?This assay is not validated for testing neonatal or ?myeloproliferative syndrome specimens for Vitamin B12 levels. ?Performed at [facility], [address] ?[place], [place] [address] ?  ?Protime-INR     Status: Abnormal  ? Collection Time: 01/13/22  3:55 AM  ?Result Value Ref Range  ? Prothrombin Time 29.8 (H)  11.4 - 15.2 seconds  ? INR 2.9 (H) 0.8 - 1.2  ?  Comment: (NOTE) ?INR goal varies based on device and disease states. ?Performed at [facility], [address] ?[place], [place] [address]

## 2022-01-14 NOTE — Procedures (Signed)
Pt was brought to [place] for paracentesis. Upon US examination, there was trace fluid on L and no fluid on right with no safe window seen for paracentesis.  ?Exam was ended and explained to family. ?Care team notified of findings.  ? ? ? ?Narda Rutherford, AGNP-BC ?01/14/2022, 12:32 PM ? ? ?

## 2022-01-15 DIAGNOSIS — R7881 Bacteremia: Secondary | ICD-10-CM | POA: Diagnosis not present

## 2022-01-15 DIAGNOSIS — E871 Hypo-osmolality and hyponatremia: Secondary | ICD-10-CM | POA: Diagnosis not present

## 2022-01-15 DIAGNOSIS — N179 Acute kidney failure, unspecified: Secondary | ICD-10-CM | POA: Diagnosis not present

## 2022-01-15 DIAGNOSIS — J189 Pneumonia, unspecified organism: Secondary | ICD-10-CM | POA: Diagnosis not present

## 2022-01-15 DIAGNOSIS — B9561 Methicillin susceptible Staphylococcus aureus infection as the cause of diseases classified elsewhere: Secondary | ICD-10-CM | POA: Diagnosis not present

## 2022-01-15 DIAGNOSIS — E785 Hyperlipidemia, unspecified: Secondary | ICD-10-CM | POA: Diagnosis not present

## 2022-01-15 LAB — CULTURE, BLOOD (ROUTINE X 2)
Culture: NO GROWTH
Culture: NO GROWTH
Special Requests: ADEQUATE
Special Requests: ADEQUATE

## 2022-01-15 LAB — RENAL FUNCTION PANEL
Albumin: 2.4 g/dL — ABNORMAL LOW (ref 3.5–5.0)
Anion gap: 18 — ABNORMAL HIGH (ref 5–15)
BUN: 126 mg/dL — ABNORMAL HIGH (ref 8–23)
CO2: 15 mmol/L — ABNORMAL LOW (ref 22–32)
Calcium: 8.4 mg/dL — ABNORMAL LOW (ref 8.9–10.3)
Chloride: 103 mmol/L (ref 98–111)
Creatinine, Ser: 3.74 mg/dL — ABNORMAL HIGH (ref 0.61–1.24)
GFR, Estimated: 17 mL/min — ABNORMAL LOW (ref >60)
Glucose, Bld: 100 mg/dL — ABNORMAL HIGH (ref 70–99)
Phosphorus: 7.7 mg/dL — ABNORMAL HIGH (ref 2.5–4.6)
Potassium: 3.2 mmol/L — ABNORMAL LOW (ref 3.5–5.1)
Sodium: 136 mmol/L (ref 135–145)

## 2022-01-15 LAB — CBC
HCT: 25.7 % — ABNORMAL LOW (ref 39.0–52.0)
Hemoglobin: 8.9 g/dL — ABNORMAL LOW (ref 13.0–17.0)
MCH: 30.6 pg (ref 26.0–34.0)
MCHC: 34.6 g/dL (ref 30.0–36.0)
MCV: 88.3 fL (ref 80.0–100.0)
Platelets: 541 10*3/uL — ABNORMAL HIGH (ref 150–400)
RBC: 2.91 MIL/uL — ABNORMAL LOW (ref 4.22–5.81)
RDW: 14.7 % (ref 11.5–15.5)
WBC: 16.3 10*3/uL — ABNORMAL HIGH (ref 4.0–10.5)
nRBC: 0 % (ref 0.0–0.2)

## 2022-01-15 LAB — GLUCOSE, CAPILLARY
Glucose-Capillary: 98 mg/dL (ref 70–99)
Glucose-Capillary: 96 mg/dL (ref 70–99)

## 2022-01-15 MED ORDER — POLYVINYL ALCOHOL 1.4 % OP SOLN: 1.0000 [drp] | Freq: Four times a day (QID) | OPHTHALMIC | Status: DC | PRN

## 2022-01-15 MED ORDER — OLANZAPINE 5 MG PO TABS
2.5000 mg | ORAL_TABLET | Freq: Once | ORAL | Status: AC
  Administered 2022-01-15: 2.5 mg via ORAL
  Filled 2022-01-15: qty 1

## 2022-01-15 MED ORDER — HALOPERIDOL LACTATE 5 MG/ML IJ SOLN
0.5000 mg | INTRAMUSCULAR | Status: DC | PRN
  Administered 2022-01-15 – 2022-01-18 (×12): 0.5 mg via INTRAVENOUS
  Filled 2022-01-15 (×11): qty 1

## 2022-01-15 MED ORDER — GLYCOPYRROLATE 1 MG PO TABS
1.0000 mg | ORAL_TABLET | ORAL | Status: DC | PRN
  Filled 2022-01-15: qty 1

## 2022-01-15 MED ORDER — BIOTENE DRY MOUTH MT LIQD: 15.0000 mL | OROMUCOSAL | Status: DC | PRN

## 2022-01-15 MED ORDER — GLYCOPYRROLATE 0.2 MG/ML IJ SOLN: 0.2000 mg | INTRAMUSCULAR | Status: DC | PRN

## 2022-01-15 MED ORDER — HYDROMORPHONE HCL 1 MG/ML IJ SOLN
0.5000 mg | INTRAMUSCULAR | Status: DC | PRN
  Administered 2022-01-15 (×2): 0.5 mg via INTRAVENOUS
  Administered 2022-01-16 – 2022-01-18 (×15): 1 mg via INTRAVENOUS
  Filled 2022-01-15 (×18): qty 1

## 2022-01-15 MED ORDER — DIPHENHYDRAMINE HCL 50 MG/ML IJ SOLN: 12.5000 mg | INTRAMUSCULAR | Status: DC | PRN

## 2022-01-15 MED ORDER — HALOPERIDOL LACTATE 2 MG/ML PO CONC
0.5000 mg | ORAL | Status: DC | PRN
Start: 2022-01-15 — End: 2022-01-18
  Filled 2022-01-15: qty 0.3

## 2022-01-15 MED ORDER — POTASSIUM CHLORIDE CRYS ER 20 MEQ PO TBCR
20.0000 meq | EXTENDED_RELEASE_TABLET | Freq: Once | ORAL | Status: AC
Start: 2022-01-15 — End: 2022-01-15
  Administered 2022-01-15: 20 meq via ORAL
  Filled 2022-01-15: qty 1

## 2022-01-15 MED ORDER — OLANZAPINE 5 MG PO TABS
5.0000 mg | ORAL_TABLET | Freq: Two times a day (BID) | ORAL | Status: DC
  Administered 2022-01-16 (×2): 5 mg via ORAL
  Filled 2022-01-15 (×3): qty 1

## 2022-01-15 MED ORDER — HALOPERIDOL 0.5 MG PO TABS
0.5000 mg | ORAL_TABLET | ORAL | Status: DC | PRN
Start: 2022-01-15 — End: 2022-01-18
  Filled 2022-01-15: qty 1

## 2022-01-15 MED ORDER — COLCHICINE 0.3 MG HALF TABLET
0.3000 mg | ORAL_TABLET | Freq: Every day | ORAL | Status: DC
  Administered 2022-01-15: 0.3 mg via ORAL
  Filled 2022-01-15: qty 1

## 2022-01-15 MED ORDER — STERILE WATER FOR INJECTION IV SOLN
INTRAVENOUS | Status: DC
  Filled 2022-01-15: qty 150

## 2022-01-15 MED ORDER — SODIUM CHLORIDE 0.9 % IV SOLN
12.5000 mg | Freq: Four times a day (QID) | INTRAVENOUS | Status: DC | PRN
  Filled 2022-01-15: qty 0.5

## 2022-01-15 NOTE — Progress Notes (Signed)
? ? ? ? ? ? ?Subjective: ? ?Patient with fluctuating/declining mentation ?Aki worsening ?Presumed cirrhosis ? ?Tee placed on hold ?Discussed with family today. Unfortunate situation and given cirrhosis/aki they would like to transition to comfort care ? ? ? ? ?Antibiotics:  ?Anti-infectives (From admission, onward)  ? ? Start     Dose/Rate Route Frequency Ordered Stop  ? 01/14/22 1600  ceFAZolin (ANCEF) IVPB 2g/100 mL premix  Status:  Discontinued       ? 2 g ?200 mL/hr over 30 Minutes Intravenous Every 12 hours 01/14/22 0806 01/15/22 1300  ? 01/05/22 1400  azithromycin (ZITHROMAX) 500 mg in sodium chloride 0.9 % 250 mL IVPB  Status:  Discontinued       ? 500 mg ?250 mL/hr over 60 Minutes Intravenous Every 24 hours 01/04/22 1257 01/05/22 0851  ? 01/05/22 1200  cefTRIAXone (ROCEPHIN) 1 g in sodium chloride 0.9 % 100 mL IVPB  Status:  Discontinued       ? 1 g ?200 mL/hr over 30 Minutes Intravenous Every 24 hours 01/04/22 1257 01/05/22 0226  ? 01/05/22 0400  ceFAZolin (ANCEF) IVPB 2g/100 mL premix  Status:  Discontinued       ? 2 g ?200 mL/hr over 30 Minutes Intravenous Every 8 hours 01/05/22 0226 01/14/22 0806  ? 01/04/22 1215  cefTRIAXone (ROCEPHIN) 2 g in sodium chloride 0.9 % 100 mL IVPB       ? 2 g ?200 mL/hr over 30 Minutes Intravenous  Once 01/04/22 1208 01/04/22 1338  ? 01/04/22 1215  azithromycin (ZITHROMAX) 500 mg in sodium chloride 0.9 % 250 mL IVPB       ? 500 mg ?250 mL/hr over 60 Minutes Intravenous  Once 01/04/22 1208 01/04/22 1442  ? ?  ? ? ?Medications: ?Scheduled Meds: ? OLANZapine  2.5 mg Oral Once  ? OLANZapine  5 mg Oral BID  ? ?Continuous Infusions: ? chlorproMAZINE (THORAZINE) IV    ? ?PRN Meds:.acetaminophen **OR** acetaminophen, albuterol, antiseptic oral rinse, chlorproMAZINE (THORAZINE) IV, diphenhydrAMINE, glycopyrrolate **OR** glycopyrrolate **OR** glycopyrrolate, haloperidol **OR** haloperidol **OR** haloperidol lactate, HYDROmorphone (DILAUDID) injection, ipratropium, methocarbamol,  ondansetron **OR** ondansetron (ZOFRAN) IV, polyvinyl alcohol ? ? ? ?Objective: ?Weight change:  ? ?Intake/Output Summary (Last 24 hours) at 01/15/2022 1452 ?Last data filed at 01/15/2022 0600 ?Gross per 24 hour  ?Intake 1881.54 ml  ?Output 200 ml  ?Net 1681.54 ml  ? ? ?Blood pressure (!) 128/92, pulse (!) 107, temperature 97.8 ?F (36.6 ?C), temperature source Axillary, resp. rate 20, height '5\' 6"'$  (1.676 m), weight 90.3 kg, SpO2 97 %. ?Temp:  [97.8 ?F (36.6 ?C)-99.4 ?F (37.4 ?C)] 97.8 ?F (36.6 ?C) (04/17 1113) ?Pulse Rate:  [86-107] 107 (04/17 1113) ?Resp:  [18-20] 20 (04/17 0446) ?BP: (111-128)/(59-92) 128/92 (04/17 1113) ?SpO2:  [97 %-98 %] 97 % (04/17 1113) ?Weight:  [90.3 kg] 90.3 kg (04/17 0500) ? ?Physical Exam: ?General: patient spontaneous eyes opening but not much interaction; minimally verbal with few words; no distress ?Heent: normocephalic; per; conj clear ?Cv: rrr no mrg ?Lungs cta ?Abd soft ?Ext trace edema ?Skin no rash ?Neuro: generalized weakness; nonfocal otherwise ? ?CBC: ?Lab Results  ?Component Value Date  ? WBC 16.3 (H) 01/15/2022  ? HGB 8.9 (L) 01/15/2022  ? HCT 25.7 (L) 01/15/2022  ? MCV 88.3 01/15/2022  ? PLT 541 (H) 01/15/2022  ? ? ? ? ?BMET ?Recent Labs  ?  01/14/22 ?2671 01/15/22 ?0339  ?NA 133* 136  ?K 3.5 3.2*  ?CL 99 103  ?CO2 17* 15*  ?  GLUCOSE 105* 100*  ?BUN 112* 126*  ?CREATININE 3.28* 3.74*  ?CALCIUM 7.9* 8.4*  ? ? ? ? ?Liver Panel ? ?Recent Labs  ?  01/13/22 ?0355 01/14/22 ?7026 01/15/22 ?0339  ?PROT 6.4*  --   --   ?ALBUMIN 2.0* 2.0* 2.4*  ?AST 79*  --   --   ?ALT 7  --   --   ?ALKPHOS 158*  --   --   ?BILITOT 1.1  --   --   ? ? ? ? ? ? ?Sedimentation Rate ?No results for input(s): ESRSEDRATE in the last 72 hours. ?C-Reactive Protein ?No results for input(s): CRP in the last 72 hours. ? ?Micro Results: ?Recent Results (from the past 720 hour(s))  ?Blood Culture (routine x 2)     Status: Abnormal  ? Collection Time: 01/04/22 10:55 AM  ? Specimen: BLOOD  ?Result Value Ref Range  Status  ? Specimen Description   Final  ?  BLOOD LEFT ANTECUBITAL ?Performed at [facility], [place] [address], [place], [place] [address] ?  ? Special Requests   Final  ?  BOTTLES DRAWN AEROBIC AND ANAEROBIC Blood Culture adequate volume ?Performed at [facility], [place] [address], [place], [place] [address] ?  ? Culture  Setup Time   Final  ?  GRAM POSITIVE COCCI ?IN BOTH AEROBIC AND ANAEROBIC BOTTLES ?CRITICAL VALUE NOTED.  VALUE IS CONSISTENT WITH PREVIOUSLY REPORTED AND CALLED VALUE. ?  ? Culture (A)  Final  ?  STAPHYLOCOCCUS AUREUS ?SUSCEPTIBILITIES PERFORMED ON PREVIOUS CULTURE WITHIN THE LAST 5 DAYS. ?Performed at [facility], [place] [address] [place], [place] [address] ?  ? Report Status 01/07/2022 FINAL  Final  ?Blood Culture (routine x 2)     Status: Abnormal  ? Collection Time: 01/04/22 10:56 AM  ? Specimen: BLOOD  ?Result Value Ref Range Status  ? Specimen Description   Final  ?  BLOOD RIGHT ANTECUBITAL ?Performed at [facility], [place] [address], [place], [place] [address] ?  ? Special Requests   Final  ?  BOTTLES DRAWN AEROBIC AND ANAEROBIC Blood Culture results may not be optimal due to an excessive volume of blood received in culture bottles ?Performed at [facility], [place] [address], [place], [place] [address] ?  ? Culture  Setup Time   Final  ?  GRAM POSITIVE COCCI ?AEROBIC BOTTLE ONLY ?IN BOTH AEROBIC AND ANAEROBIC BOTTLES ?CRITICAL RESULT CALLED TO, READ BACK BY AND VERIFIED WITH: ?PHARMD ELLEN JACKSON 01/05/22'@2'$ :14 BY TW ?Performed at [facility], [place] [address] [place],  [address] ?  ? Culture STAPHYLOCOCCUS AUREUS (A)  Final  ? Report Status 01/07/2022 FINAL  Final  ? Organism ID, Bacteria STAPHYLOCOCCUS AUREUS  Final  ?    Susceptibility  ? Staphylococcus aureus - MIC*  ?  CIPROFLOXACIN <=0.5 SENSITIVE Sensitive   ?  ERYTHROMYCIN <=0.25 SENSITIVE Sensitive   ?  GENTAMICIN <=0.5 SENSITIVE Sensitive   ?   OXACILLIN 0.5 SENSITIVE Sensitive   ?  TETRACYCLINE <=1 SENSITIVE Sensitive   ?  VANCOMYCIN <=0.5 SENSITIVE Sensitive   ?  TRIMETH/SULFA <=10 SENSITIVE Sensitive   ?  CLINDAMYCIN <=0.25 SENSITIVE Sensitive   ?  RIFAMPIN <=0.5 SENSITIVE Sensitive   ?  Inducible Clindamycin NEGATIVE Sensitive   ?  * STAPHYLOCOCCUS AUREUS  ?Blood Culture ID Panel (Reflexed)     Status: Abnormal  ? Collection Time: 01/04/22 10:56 AM  ?Result Value Ref Range Status  ?  Enterococcus faecalis NOT DETECTED NOT DETECTED Final  ? Enterococcus Faecium NOT DETECTED NOT DETECTED Final  ? Listeria monocytogenes NOT DETECTED NOT DETECTED Final  ? Staphylococcus species DETECTED (A) NOT DETECTED Final  ?  Comment: CRITICAL RESULT CALLED TO, READ BACK BY AND VERIFIED WITH: ?PHARMD ELLEN JACKSON 01/05/22'@2'$ :14 BY TW ?  ? Staphylococcus aureus (BCID) DETECTED (A) NOT DETECTED Final  ?  Comment: CRITICAL RESULT CALLED TO, READ BACK BY AND VERIFIED WITH: ?PHARMD ELLEN JACKSON 01/05/22'@2'$ :14 BY TW ?  ? Staphylococcus epidermidis NOT DETECTED NOT DETECTED Final  ? Staphylococcus lugdunensis NOT DETECTED NOT DETECTED Final  ? Streptococcus species NOT DETECTED NOT DETECTED Final  ? Streptococcus agalactiae NOT DETECTED NOT DETECTED Final  ? Streptococcus pneumoniae NOT DETECTED NOT DETECTED Final  ? Streptococcus pyogenes NOT DETECTED NOT DETECTED Final  ? A.calcoaceticus-baumannii NOT DETECTED NOT DETECTED Final  ? Bacteroides fragilis NOT DETECTED NOT DETECTED Final  ? Enterobacterales NOT DETECTED NOT DETECTED Final  ? Enterobacter cloacae complex NOT DETECTED NOT DETECTED Final  ? Escherichia coli NOT DETECTED NOT DETECTED Final  ? Klebsiella aerogenes NOT DETECTED NOT DETECTED Final  ? Klebsiella oxytoca NOT DETECTED NOT DETECTED Final  ? Klebsiella pneumoniae NOT DETECTED NOT DETECTED Final  ? Proteus species NOT DETECTED NOT DETECTED Final  ? Salmonella species NOT DETECTED NOT DETECTED Final  ? Serratia marcescens NOT DETECTED NOT DETECTED Final  ?  Haemophilus influenzae NOT DETECTED NOT DETECTED Final  ? Neisseria meningitidis NOT DETECTED NOT DETECTED Final  ? Pseudomonas aeruginosa NOT DETECTED NOT DETECTED Final  ? Stenotrophomonas maltophilia NOT DETECTE

## 2022-01-15 NOTE — Progress Notes (Signed)
?Progress Note ? ?Patient: Vincent White. ELF:[id number] DOB: 20-Aug-1951  ?DOA: 01/04/2022  DOS: 01/15/2022  ?  ?Brief hospital course: ?Vincent Gentles. is a 71 year old male with past medical history significant for essential pretension, hyperlipidemia, cervical spondylosis, gout, vitamin D deficiency, mitral valve insufficiency, pulmonary HTN, type 2 diabetes mellitus, OSA on CPAP who presented to [facility] ED on 4/6 via EMS with generalized weakness, fatigue, poor oral intake and inability to ambulate over the last 2-3 weeks. He was admitted for sepsis, ultimately growing MSSA in urine and blood cultures. There were no vegetations on TTE, though repeat blood cultures were persistently positive. TEE will be performed, and blood cultures repeated 4/13 remain NGTD. Podiatry was consulted for a necrotic toe, and performed partial amputation of the right second toe 4/8. Subsequent investigation revealed a right lateral foot abscess for which bedside I&D was performed 4/12.  ? ?The patient's mood appears to be declining. Psychiatry was consulted and started cymbalta with no current concern for SI. ? ?Due to patient reporting back pain, MRI of the lumbar spine was performed revealing epidural abscess seen to be discitis-osteomyelitis at many levels of thoracic spine with paravertebral phlegmon/abscess and epidural abscess at T11. Neurosurgery is consulted.   ? ?Developing AKI again after trial off IVF. Restarted IVF + albumin, Cr worse.  ? ?Mental status is also worsening.  ? ?Prognosis grew worse and worse despite maximal medical therapy and the goal of care was ultimately transitioned to pursuing comfort, peace, dignity, and residential hospice placement is pursued. ? ?Assessment and Plan: ?Goals of care counseling/discussion, end of life care: The patient in the past has been clear that he would not want heroic measures if his quality of life could not be assured to be acceptable as a result of these measures. He has multiorgan  failure due to refractory MSSA bacteremia with epidural abscess, discitis/osteomyelitis of vertebrae (not surgical candidate) and right 2nd toe (s/p amputation), which has progressed due to underlying alcoholic cirrhosis. After discussion with psychiatry, infectious disease, nephrology, and palliative care teams, the medical team agrees that this patient's prognosis is now terminal regardless of which interventions we pursue. His family is in agreement with proceeding with comfort measures and hospice placement.  ?- CM consulted for residential hospice choice ?- Change level of care to palliative care, DC monitoring and interventions not aimed purely at relief of suffering. Prn's ordered. ? ?Severe sepsis due to MSSA bacteremia with evidence of multifocal seeding of infection including UTI (+urine culture), thoracic epidural abscess, possible septic knee(s), right 2nd toe osteomyelitis (pathology report w/acute osteomyelitis, negative margins, +MSSA on culture 4/8) with refractory positive blood cultures (4/6, 4/8 (1 of 2), 4/10 (1 of 2)). ?- Remained with leukocytosis and periodic fevers despite eventual blood cultures clearance as of 4/12. D/w IF, Dr. Gale Journey. We will deescalate to comfort measures at this time.  ?- TEE no longer within goals of care. Cardiology notified to cancel TEE. ?- Sepsis physiology has resolved. Midodrine was initiated but since discontinued.  ? ?Gouty tophi of knees: Dry aspiration without purulence by orthopedics 4/14. No concern for septic joint at this time.  ? 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  ?- Neurosurgery, [place], consulted and recommended against neurosurgical intervention.  ? ?Lateral right foot abscess: s/p bedside I&D by podiatry 4/12.  ? ?Acute renal failure: Cr 2.2 on admission, multifactorial. s/p IV  albumin and  midodrine with hx cirrhosis and suspected hepatorenal syndrome. Renal function ultimately improved but later he developed fulminant renal failure despite assistance with management per nephrology. Goals of care discussions have been consistent, pt would not desire dialysis even if he were felt to be a good candidate which he is not.  ?- No reversible cause has been found. Will treat supportively at end of life. Use dilaudid in lieu of morphine.   ? ?Acute metabolic encephalopathy. Major depressive disorder: Acute delirium is primary concern, though growing uremic encephalopathy is anticipated as well. Ammonia remains not elevated.  ?- DC cymbalta per psych. Started olanzapine. I will increase dose today and continue with prn haldol. Growing more irritated, and pt is at risk for terminal agitation. We will monitor closely for this. Doubt encephalitis at this time.  ? ?Decompensated alcoholic cirrhosis with coagulopathy: INR elevated and albumin very low consistent with impaired synthetic function. Hepatitis panel negative, HIV NR.  ?- Certainly not transplant candidate and current clinical deterioration is expected to be life-ending.  ? ?Gout: Pt had suspicion that knee swelling as gout flare. XR with small joint effusions but marked prepatellar swelling. Uric acid is 8.7.  ?- Due to renal failure, will stop colchicine and probenecid. ?- Ortho consulted, bedside aspiration was dry on 4/14. ?- Pain control as ordered. ? ?Anasarca: LVEF 60-65%, no RWMA, indeterminate diastolic function. Normal IVC. Echo 2021 has normal diastolic function. No DVT seen on venous U/S. ?- No improvement with maximal medical therapy including repeat albumin infusions.  ?  ?Reactive thrombocytosis: Noted.  ? ?Hyponatremia: Improved at goal rate after admission from nadir of 118. FENa at that time clearly prerenal and has since improved to very mild, generally in low 130's. Given its persistence, suspect this is related to  cirrhosis.   ?- Doubt this level of sodium abnormality is affecting mental status significantly.  ? ?Acute urinary retention: s/p foley 4/7 ?- Continue foley for comfort ?  ?Alcohol abuse: No evidence of significant withdrawal.  ?- Cessation counseling has been discussed at significant length.  ? ?History of opioid dependence: Has been weaned, is known by family to have had compulsive behaviors obtaining opioids in the past.  ?- liberalized dilaudid prn pain as our goal is comfort measures, not cure. ?  ?T2DM:  ?- Stop checking CBGs. ?  ?HTN: DC'ed norvasc and losartan.  ?  ?Normocytic anemia: Stable, no longer checking labs. ? ?HLD:  ?- Holding statin   ? ?OSA:  ?- CPAP qHS if pt desires this. ? ?Stage 2 pressure injuries to bilateral buttocks: This is NOT believed to have been POA.  ?- Foam in place. Offload if required for comfort. ? ?Obesity: Estimated body mass index is 32.12 kg/m? as calculated from the following: ?  Height as of this encounter: '5\' 6"'$  (1.676 m). ?  Weight as of this encounter: 90.3 kg. ? ?Subjective: Didn't sleep, mental status is worse, still completely disoriented and beginning to get irritated. Extensive family discussion as summarized above.   ? ?Objective: ?BP (!) 128/92 (BP Location: Right Arm)   Pulse (!) 107   Temp 97.8 ?F (36.6 ?C) (Axillary)   Resp 20   Ht '5\' 6"'$  (1.676 m)   Wt 90.3 kg   SpO2 97%   BMI 32.12 kg/m?   ?Acutely and chronically ill-appearing male ?Alert, disoriented, follows few commands, but does interact. Nonsensible speech without dysarthria. Persistent weakness on exam (vs. inability to follow commands). No asterixis in RUE.  ?Clear, nonlabored. Upper airway  wheezing at times.  ?RRR, no MRG, diffuse anasarca is slightly worse. ? ?Family Communication: Sister at bedside this AM, daughter by phone this PM. They and the patient's wife were present at bedside during our last conference. ? ?Disposition: ?Status is: Inpatient ?Remains inpatient appropriate because:  Multiorgan failure, transitioning to comfort measures ?Planned Discharge Destination: Residential hospice ? ?Patrecia Pour, MD ?01/15/2022 1:41 PM ?Page by [email or web address]  ?

## 2022-01-15 NOTE — Progress Notes (Signed)
PT Cancellation Note ? ?Patient Details ?Name: Vincent White. ?MRN: [id number] ?DOB: [birth year] ? ? ?Cancelled Treatment:     Per MD PN  the medical team agrees that this patient's prognosis is now terminal regardless of which interventions we pursue. His family is in agreement with proceeding with comfort measures and hospice placement.  ?- CM consulted for residential hospice choice ?Will update LPT ? ?Rica Koyanagi  PTA ?Acute  Rehabilitation Services ?Pager      [phone] ?Office      [phone] ? ?

## 2022-01-15 NOTE — Progress Notes (Signed)
Chaplain engaged in an initial visit with Vincent White and his sister, Vincent White.  Chaplain offered prayer over [name] at Peter request.  She shared that he is the oldest of five children.  They lost a sibling about five years ago.  Vincent White also shared that her mom died at [employer] of Bone Cancer some time ago.  Chaplain checked in on her as she has been my the side of her loved ones throughout those hard times.   ? ?Chaplain offered spiritual support through prayer, a compassionate presence, reflective listening and support  ? ? ? 01/15/22 1900  ?Clinical Encounter Type  ?Visited With Patient and family together  ?Visit Type Initial;Spiritual support  ?Referral From Family  ?Consult/Referral To Chaplain  ?Spiritual Encounters  ?Spiritual Needs Prayer  ? ? ?

## 2022-01-15 NOTE — TOC Progression Note (Signed)
Transition of Care (TOC) - Progression Note  ? ? ?Patient Details  ?Name: Vincent White. ?MRN: [id number] ?Date of Birth: 22-Feb-1951 ? ?Transition of Care (TOC) CM/SW Contact  ?Purcell Mouton, RN ?Phone Number: ?01/15/2022, 2:08 PM ? ?Clinical Narrative:    ?Spoke with pt's wife who selected [facility]. Referral given to in house rep [place], [place].  ? ? ?Expected Discharge Plan: [place] ?Barriers to Discharge: Continued Medical Work up ? ?Expected Discharge Plan and Services ?Expected Discharge Plan: [place] ?  ?  ?  ?Living arrangements for the past 2 months: [place] ?                ?  ?  ?  ?  ?  ?  ?  ?  ?  ?  ? ? ?Social Determinants of Health (SDOH) Interventions ?  ? ?Readmission Risk Interventions ?   ? View : No data to display.  ?  ?  ?  ? ? ?

## 2022-01-15 NOTE — Progress Notes (Signed)
[facility] ([facility]) Hospital Liaison Note  Referral received for patient/family interest in [facility]. Chart under review by [facility] physician.   Hospice eligibility pending.   Please call with any questions or concerns. Thank you   Shanita Wicker, LCSW [facility] Hospital Liaison  [phone] 

## 2022-01-15 NOTE — Progress Notes (Signed)
[facility] ?Progress Note ? ?Subjective: UOP remains very low. B/Cr up to 126 and 3.74 today.  ? ?Vitals:  ? 01/14/22 1740 01/14/22 2125 01/15/22 0446 01/15/22 0500  ?BP: 111/68 122/71 (!) 124/59   ?Pulse: 86 98 96   ?Resp: '18 20 20   '$ ?Temp: 98.2 ?F (36.8 ?C) 98.9 ?F (37.2 ?C) 99.4 ?F (37.4 ?C)   ?TempSrc: Oral Oral    ?SpO2: 97% 98% 97%   ?Weight:    90.3 kg  ?Height:      ? ? ?Exam: ?Gen alert, remains confused ?Sclera anicteric ?No jvd or bruits ?Chest clear bilat to bases ?RRR no RG ?Abd soft ntnd no mass or ascites +bs ?GU normal male w/ foley in place ?Ext 1-2+ bilat LE edema ?Neuro is confused, moves all ext ? ?  ? Home meds include - losartan, asa, norco, metformin, celebrex, lipitor, norvasc, prns ?  ?Assessment/ Plan: ?Renal failure - in patient w/ MSSA bacteremia w/ associated foot and spine infections, and also long-term alcoholism w/ cirrhosis. No hx renal failure, no old labs in the system. Creat was 1.8 on admit then improved to 0.9. Then developed AKI now severe w/ marked decline in UOP and creat 3.2 yesterday. Low UNa + sig edema are all consistent w/ HRS. Possible intravasc vol depletion, but IVF"s not helping. BP's are not low. Overall prognosis is very poor, have d/w pmd. Palliative team has been called.  ?[place] - prognosis is poor, pt is not a HD candidate given severe comorbidities  ?MSSA bacteremia - w/ bilat foot and multilevel spinal infection. Per pmd.  ?AMS - not sure cause, has multi-organ failure at this point.  ?Cirrhosis - hx of etoh abuse ?Hyponatremia - better, lowering NS to 40 cc /hr.  ?Etoh abuse ?BP - dc'd norvasc, avoid BP lowering meds in this pt ? ? ? ?Rob Doctor, hospital ?01/15/2022, 5:57 AM ? ? ?Recent Labs  ?Lab 01/14/22 ?9735 01/14/22 ?1115 01/15/22 ?0339  ?HGB 9.4* 9.4* 8.9*  ?ALBUMIN 2.0*  --  2.4*  ?CALCIUM 7.9*  --  8.4*  ?PHOS 6.6*  --  7.7*  ?CREATININE 3.28*  --  3.74*  ?K 3.5  --  3.2*  ? ?Inpatient medications: ? Chlorhexidine Gluconate Cloth  6 each Topical  Daily  ? colchicine  0.3 mg Oral BID  ? And  ? probenecid  500 mg Oral BID  ? folic acid  1 mg Oral Daily  ? insulin aspart  0-15 Units Subcutaneous TID WC  ? lidocaine  10 mL Intradermal Once  ? melatonin  3 mg Oral QHS  ? multivitamin with minerals  1 tablet Oral Daily  ? OLANZapine  2.5 mg Oral BID  ? [START ON 01/17/2022] thiamine injection  100 mg Intravenous Q24H  ? ? sodium chloride 75 mL/hr at 01/15/22 0532  ? albumin human 25 g (01/14/22 2108)  ?  ceFAZolin (ANCEF) IV 2 g (01/15/22 0324)  ? thiamine injection 500 mg (01/14/22 1246)  ? ?acetaminophen **OR** acetaminophen, albuterol, ipratropium, methocarbamol, ondansetron **OR** ondansetron (ZOFRAN) IV ? ? ? ? ? ? ?

## 2022-01-15 NOTE — Progress Notes (Signed)
Physical Therapy Discharge ?Patient Details ?Name: Vincent White. ?MRN: [id number] ?DOB: 04/22/1951 ?Today's Date: 01/15/2022 ?Time:  -  ?  ? ?Patient discharged from PT services secondary to medical decline - now on comfort care measures. ?Please see latest therapy progress note for current level of functioning and progress toward goals.   ? ?Progress and discharge plan discussed with patient and/or caregiver: Patient unable to participate in discharge planning and no caregivers available ? ?GP ?  Tresa Endo PT ?Acute Rehabilitation Services ?Pager [phone] ?Office [phone] ? ? ?Claudell Rhody, Shella Maxim ?01/15/2022, 3:33 PM  ?

## 2022-01-16 DIAGNOSIS — E871 Hypo-osmolality and hyponatremia: Secondary | ICD-10-CM | POA: Diagnosis not present

## 2022-01-16 NOTE — Progress Notes (Signed)
Palliative care brief note ? ?Palliative care consult received.  Chart reviewed and discussed with Dr. Reesa Chew. ? ?Mr. Vincent White has been transitioned to full comfort and goal is for residential hospice. ? ?As goals are clear and symptoms managed, will hold on formal consult at this time. ? ?Please call or reconsult with any palliative specific needs in the care of Mr. Vincent White moving forward. ? ?Micheline Rough, MD ?[place] Team ?[phone] ? ?No charge note ? ?

## 2022-01-16 NOTE — Progress Notes (Signed)
[employer] [facility]) Hospital liaison note  ? ?[facility] physician reviewed chart and would like patient reassessed tomorrow.  ? ?Hospice eligibility pending.  ? ?Please call with any questions or concerns. Thank you ? ?Roselee Nova, LCSW ?[place] Hospital Liaison ?[phone] ?

## 2022-01-16 NOTE — Care Management Important Message (Signed)
Important Message ? ?Patient Details Pending Hospice ?Name: Vincent White. ?MRN: [id number] ?Date of Birth: May 21, 1951 ? ? ?Medicare Important Message Given:  No ? ? ? ? ?Kerin Salen ?01/16/2022, 10:56 AM ?

## 2022-01-16 NOTE — Progress Notes (Signed)
?PROGRESS NOTE ? ? ? ?Vincent White.  SWH:[id number] DOB: 1951/08/21 DOA: 01/04/2022 ?PCP: Leighton Ruff, MD (Inactive)  ? ?Brief Narrative:  ?71 year old with history of HTN, HLD, cervical spondylosis, gout, vitamin D deficiency, mitral insufficiency, pulmonary HTN, DM 2, OSA on CPAP presented to ED on 4/6 for generalized weakness and poor oral intake.  He was diagnosed with MSSA bacteremia, sepsis.  Echocardiogram was unremarkable, podiatry was consulted for necrotic foot who performed partial right second toe amputation on 4/8.  Was also noted to have right lateral foot abscess requiring I&D on 4/12.  Hospital course was complicated by acute kidney injury and worsening mental status. ?Despite several efforts his condition continued to progress and worsen.  He has been transitioned to hospice care/comfort care. ? ? ?Assessment & Plan: ? Principal Problem: ?  Hyponatremia ?Active Problems: ?  Major depressive disorder, recurrent episode, moderate ([place]) ?  Essential hypertension ?  Dyslipidemia ?  Type 2 diabetes mellitus with hyperglycemia (HCC) ?  Gout ?  AKI (acute kidney injury) ([place]) ?  Transaminitis ?  CAP (community acquired pneumonia) ?  OSA (obstructive sleep apnea) ?  Injury of right second toe ?  Osteomyelitis of second toe of right foot ([place]) ?  Pressure injury of skin ?  MSSA bacteremia ?  Staphylococcal arthritis of left ankle ([place]) ?  ? ? ?Goals of care discussion ?Hospice care/comfort care patient ?-overall very poor prognosis given multiorgan failure due to bacteremia complicated by epidural abscess, discitis/osteomyelitis, amputation with underlying progressing alcoholic cirrhosis.  Multiple specialties including psychiatry, ID, nephrology and palliative care team was involved.  Appears to be terminal and patient has been transitioned to hospice care. ? ?Severe sepsis secondary to MSSA bacteremia from urinary tract infection, epidural abscess ?Right second toe osteomyelitis status post  amputation ?Discitis/osteomyelitis of his thoracic spine ?Lateral right foot abscess status post bedside I&D by podiatry ?-Despite several efforts of aggressive intervention patient continued to decline therefore transition to comfort care. ? ?Acute gout flare/tophi of his knee ?- Dry aspiration by orthopedic 4/14. ? ?Acute metabolic encephalopathy ?- Due to multiorgan failure. ? ?Acute decompensated alcoholic liver cirrhosis ?History of alcohol abuse ?- Not a transplant candidate.  Currently hospice care ? ?Acute diastolic CHF with preserved EF, 60 to 65% ?Anasarca ?- No longer requiring any medical treatment ? ?Acute urinary retention ?- Foley catheter for comfort ? ?Diabetes mellitus type 2 ?Essential hypertension ?Hyperlipidemia ?-Allergies condition are chronic.  Medications have been stopped. ? ?DVT prophylaxis:  ?Code Status:  ?Family Communication:   ? ?Status is: Inpatient ?Remains inpatient appropriate because: On going determination for placement- hospice.  ? ? ?Nutritional status ? ? ? ?  ? ?  ? ?Body mass index is 32.12 kg/m?. ? ?Pressure Injury 01/07/22 Buttocks Right;Left;Mid Stage 2 -  Partial thickness loss of dermis presenting as a shallow open injury with a red, pink wound bed without slough. Open skin, bleeding spots on bilateral buttocks (Active)  ?01/07/22 0939  ?Location: Buttocks  ?Location Orientation: Right;Left;Mid  ?Staging: Stage 2 -  Partial thickness loss of dermis presenting as a shallow open injury with a red, pink wound bed without slough.  ?Wound Description (Comments): Open skin, bleeding spots on bilateral buttocks  ?Present on Admission: No  ? ? ?Subjective: ?Seen at bedside. Not in acute distress.  ? ? ?Examination: ? ?General exam: mittens in place. Chronically ill appearing.  ?Respiratory system: Clear to auscultation. Respiratory effort normal. ?Cardiovascular system: S1 & S2 heard, RRR. No JVD, murmurs, rubs,  gallops or clicks. No pedal edema. ?Gastrointestinal system:  Abdomen is nondistended, soft and nontender. No organomegaly or masses felt. Normal bowel sounds heard. ?Central nervous system: Alert to name. Disoriented. Interactive.  ?Extremities: Symmetric 5 x 5 power. ?Skin: No rashes, lesions or ulcers ?Psychiatry: Poor judgement and insight.  ? ? ? ?Objective: ?Vitals:  ? 01/15/22 1900 01/15/22 2000 01/15/22 2013 01/16/22 0616  ?BP:   (!) 125/91 (!) 116/59  ?Pulse:   (!) 106 (!) 102  ?Resp: '19 17  18  '$ ?Temp:   97.9 ?F (36.6 ?C) 98 ?F (36.7 ?C)  ?TempSrc:   Oral Oral  ?SpO2:    94%  ?Weight:      ?Height:      ? ? ?Intake/Output Summary (Last 24 hours) at 01/16/2022 0838 ?Last data filed at 01/15/2022 1522 ?Gross per 24 hour  ?Intake 502.75 ml  ?Output --  ?Net 502.75 ml  ? ?Filed Weights  ? 01/04/22 1050 01/04/22 1517 01/15/22 0500  ?Weight: 86.2 kg 86.4 kg 90.3 kg  ? ? ? ?Data Reviewed:  ? ?CBC: ?Recent Labs  ?Lab 01/10/22 ?1832 01/13/22 ?0355 01/14/22 ?3267 01/14/22 ?1115 01/15/22 ?0339  ?WBC 20.5* 15.4* 17.4* 17.7* 16.3*  ?NEUTROABS  --  13.7*  --   --   --   ?HGB 10.3* 10.6* 9.4* 9.4* 8.9*  ?HCT 30.3* 31.4* 27.3* 26.7* 25.7*  ?MCV 89.9 88.0 88.9 88.1 88.3  ?PLT 455* 614* 595* 541* 541*  ? ?Basic Metabolic Panel: ?Recent Labs  ?Lab 01/10/22 ?1832 01/13/22 ?0355 01/14/22 ?1245 01/15/22 ?0339  ?NA 129* 131* 133* 136  ?K 3.8 3.5 3.5 3.2*  ?CL 97* 96* 99 103  ?CO2 21* 20* 17* 15*  ?GLUCOSE 142* 108* 105* 100*  ?BUN 45* 90* 112* 126*  ?CREATININE 1.51* 2.63* 3.28* 3.74*  ?CALCIUM 8.3* 8.3* 7.9* 8.4*  ?PHOS 4.0  --  6.6* 7.7*  ? ?GFR: ?Estimated Creatinine Clearance: 19.3 mL/min (A) (by C-G formula based on SCr of 3.74 mg/dL (H)). ?Liver Function Tests: ?Recent Labs  ?Lab 01/10/22 ?1832 01/13/22 ?0355 01/14/22 ?8099 01/15/22 ?0339  ?AST  --  79*  --   --   ?ALT  --  7  --   --   ?ALKPHOS  --  158*  --   --   ?BILITOT  --  1.1  --   --   ?PROT  --  6.4*  --   --   ?ALBUMIN 2.2* 2.0* 2.0* 2.4*  ? ?No results for input(s): LIPASE, AMYLASE in the last 168 hours. ?Recent Labs  ?Lab  01/14/22 ?1037  ?AMMONIA 12  ? ?Coagulation Profile: ?Recent Labs  ?Lab 01/12/22 ?1555 01/13/22 ?0355  ?INR 4.9* 2.9*  ? ?Cardiac Enzymes: ?No results for input(s): CKTOTAL, CKMB, CKMBINDEX, TROPONINI in the last 168 hours. ?BNP (last 3 results) ?No results for input(s): PROBNP in the last 8760 hours. ?HbA1C: ?No results for input(s): HGBA1C in the last 72 hours. ?CBG: ?Recent Labs  ?Lab 01/14/22 ?1110 01/14/22 ?1635 01/14/22 ?2123 01/15/22 ?0745 01/15/22 ?1136  ?GLUCAP 151* 83 90 98 96  ? ?Lipid Profile: ?No results for input(s): CHOL, HDL, LDLCALC, TRIG, CHOLHDL, LDLDIRECT in the last 72 hours. ?Thyroid Function Tests: ?No results for input(s): TSH, T4TOTAL, FREET4, T3FREE, THYROIDAB in the last 72 hours. ?Anemia Panel: ?No results for input(s): VITAMINB12, FOLATE, FERRITIN, TIBC, IRON, RETICCTPCT in the last 72 hours. ?Sepsis Labs: ?No results for input(s): PROCALCITON, LATICACIDVEN in the last 168 hours. ? ?Recent Results (from the past 240 hour(s))  ?  Culture, blood (routine x 2)     Status: None  ? Collection Time: 01/08/22 12:17 PM  ? Specimen: BLOOD  ?Result Value Ref Range Status  ? Specimen Description   Final  ?  BLOOD LEFT ANTECUBITAL ?Performed at [facility], [place] [address] [place], [place] [address] ?  ? Special Requests   Final  ?  BOTTLES DRAWN AEROBIC AND ANAEROBIC Blood Culture adequate volume ?Performed at [facility], [place] [address] [place], [place] [address] ?  ? Culture   Final  ?  NO GROWTH 5 DAYS ?Performed at [facility], [place] [address], [place], [place] [address] ?  ? Report Status 01/13/2022 FINAL  Final  ?Culture, blood (routine x 2)     Status: Abnormal  ? Collection Time: 01/08/22 12:22 PM  ? Specimen: BLOOD  ?Result Value Ref Range Status  ? Specimen Description   Final  ?  BLOOD BLOOD RIGHT HAND ?Performed at [facility], [place] [address], [place], [place] [address] ?  ? Special Requests   Final  ?  BOTTLES DRAWN  AEROBIC ONLY Blood Culture results may not be optimal due to an inadequate volume of blood received in culture bottles ?Performed at [facility], [place] [address] [place],  [address] ?  ? Culture

## 2022-01-17 DIAGNOSIS — Z515 Encounter for palliative care: Secondary | ICD-10-CM

## 2022-01-17 DIAGNOSIS — Z7189 Other specified counseling: Secondary | ICD-10-CM

## 2022-01-17 NOTE — Progress Notes (Signed)
?  Subjective:  ?Patient ID: Vincent White., male    DOB: [birth year],  MRN: [id number] ? ?Seen at bedside this morning. Patient has been placed on comfort care. Relates his foot is doing well and not having any pain.  ? ?Fever: no ?Night sweats: no ?Objective:  ? ?Vitals:  ? 01/16/22 1100 01/17/22 0437  ?BP: 104/62 (!) 109/57  ?Pulse: (!) 108 (!) 106  ?Resp: 16 16  ?Temp: 99.4 ?F (37.4 ?C) 99.6 ?F (37.6 ?C)  ?SpO2: 94% 95%  ? ?General AA&O x3. Normal mood and affect.  ?Vascular Dorsalis pedis and posterior tibial pulses 2/4 bilat. ?Brisk capillary refill to all digits. Pedal hair present.  ?Neurologic Epicritic sensation grossly reduced.  ?Dermatologic Cellulitis and rash is improved seems to be more coalesced at this point.  Incision is well coapted. ? ?Right lateral incision well coapted and healing well.   ?Orthopedic: MMT 5/5 in dorsiflexion, plantarflexion, inversion, and eversion. ?Normal joint ROM without pain or crepitus.  ? ? ?Assessment & Plan:  ?Patient was evaluated and treated and all questions answered. ? ?Right lateral foot superficial abscess status post bedside incision and drainage(postop day #7) status post right second toe amputation and osteomyelitis ?-Doing well no dressing changes needed.  WBAT in a postop shoe.  Continue antibiotics per ID and comfort care. .  We will continue to monitor his progress.  Will follow-up outpatient upon discharge.  ? ?Lorenda Peck, MD ? ?Accessible via secure chat for questions or concerns. ? ?

## 2022-01-17 NOTE — Progress Notes (Signed)
[employer]ufacturing engineer [facility]) Hospital Liaison Note ? ?Bed offered at [facility] for transfer today. Family would like to reassess tomorrow.  ? ?Hospice eligibility confirmed.  ? ?Please call with any questions or concerns. Thank you ? ?Roselee Nova, LCSW ?[facility] Liaison ?[phone] ?

## 2022-01-17 NOTE — Progress Notes (Signed)
?PROGRESS NOTE ? ? ? ?Vincent White.  YIF:[id number] DOB: 10/24/1950 DOA: 01/04/2022 ?PCP: Leighton Ruff, MD (Inactive)  ?Chief Complaint  ?Patient presents with  ? Knee Pain  ? ? ?Brief Narrative:  ?71 year old with history of HTN, HLD, cervical spondylosis, gout, vitamin D deficiency, mitral insufficiency, pulmonary HTN, DM 2, OSA on CPAP presented to ED on 4/6 for generalized weakness and poor oral intake.  He was diagnosed with MSSA bacteremia, sepsis.  Echocardiogram was unremarkable, podiatry was consulted for necrotic foot who performed partial right second toe amputation on 4/8.  Was also noted to have right lateral foot abscess requiring I&D on 4/12.  Hospital course was complicated by acute kidney injury and worsening mental status. ?Despite several efforts his condition continued to progress and worsen.  He has been transitioned to hospice care/comfort care.  ? ? ?Assessment & Plan: ?  ?Principal Problem: ?  Hyponatremia ?Active Problems: ?  Major depressive disorder, recurrent episode, moderate ([place]) ?  Essential hypertension ?  Dyslipidemia ?  Type 2 diabetes mellitus with hyperglycemia (HCC) ?  Gout ?  AKI (acute kidney injury) ([place]) ?  Transaminitis ?  CAP (community acquired pneumonia) ?  OSA (obstructive sleep apnea) ?  Injury of right second toe ?  Osteomyelitis of second toe of right foot ([place]) ?  Pressure injury of skin ?  MSSA bacteremia ?  Staphylococcal arthritis of left ankle ([place]) ? ? ?Assessment and Plan: ?Goals of care discussion ?Hospice care/comfort care patient ?-overall very poor prognosis given multiorgan failure due to bacteremia complicated by epidural abscess, discitis/osteomyelitis, amputation with underlying progressing alcoholic cirrhosis.  Multiple specialties including psychiatry, ID, nephrology and palliative care team was involved.  Appears to be terminal and patient has been transitioned to hospice care.  He's not eating, sleeping mostly after getting pain meds, some  delirium/hallucinations.  Likely days to weeks, though discussed uncertainty with family. ?  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 ?  ?Acute gout flare/tophi of his knee ?- Dry aspiration by orthopedic 4/14. ?  ?Acute metabolic encephalopathy ?- Due to multiorgan failure. ? ?Acute decompensated alcoholic liver cirrhosis ?History of alcohol abuse ?- Not [facility] transplant candidate.  Currently hospice care ?  ?Acute diastolic CHF with preserved EF, 60 to 65% ?Anasarca ?- No longer requiring any medical treatment ?  ?Acute urinary retention ?- Foley catheter for comfort ?  ?Diabetes mellitus type 2 ?Essential hypertension ?Hyperlipidemia ?-Allergies condition are chronic.  Medications have been stopped. ? ?DVT prophylaxis: comfirt ?Code Status: dnr ?Family Communication: sister/daughter ?Disposition:  ? ?Status is: Inpatient ?Remains inpatient appropriate because: hospice ?  ? ?Antimicrobials:  ?Anti-infectives (From admission, onward)  ? ? Start     Dose/Rate Route Frequency Ordered Stop  ? 01/14/22 1600  ceFAZolin (ANCEF) IVPB 2g/100 mL premix  Status:  Discontinued       ? 2 g ?200 mL/hr over 30 Minutes Intravenous Every 12 hours 01/14/22 0806 01/15/22 1300  ? 01/05/22 1400  azithromycin (ZITHROMAX) 500 mg in sodium chloride 0.9 % 250 mL IVPB  Status:  Discontinued       ? 500 mg ?250 mL/hr over 60 Minutes Intravenous Every 24 hours 01/04/22 1257 01/05/22 0851  ? 01/05/22 1200  cefTRIAXone (ROCEPHIN) 1 g in sodium  chloride 0.9 % 100 mL IVPB  Status:  Discontinued       ? 1 g ?200 mL/hr over 30 Minutes Intravenous Every 24 hours 01/04/22 1257 01/05/22 0226  ? 01/05/22 0400  ceFAZolin  (ANCEF) IVPB 2g/100 mL premix  Status:  Discontinued       ? 2 g ?200 mL/hr over 30 Minutes Intravenous Every 8 hours 01/05/22 0226 01/14/22 0806  ? 01/04/22 1215  cefTRIAXone (ROCEPHIN) 2 g in sodium chloride 0.9 % 100 mL IVPB       ? 2 g ?200 mL/hr over 30 Minutes Intravenous  Once 01/04/22 1208 01/04/22 1338  ? 01/04/22 1215  azithromycin (ZITHROMAX) 500 mg in sodium chloride 0.9 % 250 mL IVPB       ? 500 mg ?250 mL/hr over 60 Minutes Intravenous  Once 01/04/22 1208 01/04/22 1442  ? ?  ? ? ?Subjective: ?Sleeping, denies pain when awakens ?Discussed with daughter and sister - he's sleeping after pain meds, not eating ? ?Objective: ?Vitals:  ? 01/15/22 2013 01/16/22 0616 01/16/22 1100 01/17/22 0437  ?BP: (!) 125/91 (!) 116/59 104/62 (!) 109/57  ?Pulse: (!) 106 (!) 102 (!) 108 (!) 106  ?Resp:  '18 16 16  '$ ?Temp: 97.9 ?F (36.6 ?C) 98 ?F (36.7 ?C) 99.4 ?F (37.4 ?C) 99.6 ?F (37.6 ?C)  ?TempSrc: Oral Oral Oral Oral  ?SpO2:  94% 94% 95%  ?Weight:      ?Height:      ? ? ?Intake/Output Summary (Last 24 hours) at 01/17/2022 1634 ?Last data filed at 01/17/2022 0700 ?Gross per 24 hour  ?Intake 120 ml  ?Output 550 ml  ?Net -430 ml  ? ?Filed Weights  ? 01/04/22 1050 01/04/22 1517 01/15/22 0500  ?Weight: 86.2 kg 86.4 kg 90.3 kg  ? ? ?Examination: ? ?Limited due to comfort measures ? ?General exam: sleeping ?Respiratory system: unlabored ?Central nervous system: mostly sleeping, awakens and answers Kao Conry few questions, then stares ?Extremities: no LEE ? ? ? ?Data Reviewed: I have personally reviewed following labs and imaging studies ? ?CBC: ?Recent Labs  ?Lab 01/10/22 ?1832 01/13/22 ?0355 01/14/22 ?7209 01/14/22 ?1115 01/15/22 ?0339  ?WBC 20.5* 15.4* 17.4* 17.7* 16.3*  ?NEUTROABS  --  13.7*  --   --   --   ?HGB 10.3* 10.6* 9.4* 9.4* 8.9*  ?HCT 30.3* 31.4* 27.3* 26.7* 25.7*  ?MCV 89.9 88.0 88.9 88.1 88.3  ?PLT 455* 614* 595* 541* 541*  ? ? ?Basic Metabolic Panel: ?Recent Labs  ?Lab 01/10/22 ?1832 01/13/22 ?0355 01/14/22 ?4709  01/15/22 ?0339  ?NA 129* 131* 133* 136  ?K 3.8 3.5 3.5 3.2*  ?CL 97* 96* 99 103  ?CO2 21* 20* 17* 15*  ?GLUCOSE 142* 108* 105* 100*  ?BUN 45* 90* 112* 126*  ?CREATININE 1.51* 2.63* 3.28* 3.74*  ?CALCIUM 8.3* 8.3* 7.9* 8.4*  ?PHOS 4.0  --  6.6* 7.7*  ? ? ?GFR: ?Estimated Creatinine Clearance: 19.3 mL/min (Kennesha Brewbaker) (by C-G formula based on SCr of 3.74 mg/dL (H)). ? ?Liver Function Tests: ?Recent Labs  ?Lab 01/10/22 ?1832 01/13/22 ?0355 01/14/22 ?6283 01/15/22 ?0339  ?AST  --  79*  --   --   ?ALT  --  7  --   --   ?ALKPHOS  --  158*  --   --   ?BILITOT  --  1.1  --   --   ?PROT  --  6.4*  --   --   ?ALBUMIN 2.2* 2.0* 2.0* 2.4*  ? ? ?CBG: ?Recent Labs  ?  Lab 01/14/22 ?1110 01/14/22 ?1635 01/14/22 ?2123 01/15/22 ?0745 01/15/22 ?1136  ?GLUCAP 151* 83 90 98 96  ? ? ? ?Recent Results (from the past 240 hour(s))  ?Culture, blood (routine x 2)     Status: None  ? Collection Time: 01/08/22 12:17 PM  ? Specimen: BLOOD  ?Result Value Ref Range Status  ? Specimen Description   Final  ?  BLOOD LEFT ANTECUBITAL ?Performed at [facility], [place] [address] [place], [place] [address] ?  ? Special Requests   Final  ?  BOTTLES DRAWN AEROBIC AND ANAEROBIC Blood Culture adequate volume ?Performed at [facility], [place] [address] [place], [place] [address] ?  ? Culture   Final  ?  NO GROWTH 5 DAYS ?Performed at [facility], [place] [address] [place], [place] [address] ?  ? Report Status 01/13/2022 FINAL  Final  ?Culture, blood (routine x 2)     Status: Abnormal  ? Collection Time: 01/08/22 12:22 PM  ? Specimen: BLOOD  ?Result Value Ref Range Status  ? Specimen Description   Final  ?  BLOOD BLOOD RIGHT HAND ?Performed at [facility], [place] [address], [place], [place] [address] ?  ? Special Requests   Final  ?  BOTTLES DRAWN AEROBIC ONLY Blood Culture results may not be optimal due to an inadequate volume of blood received in culture bottles ?Performed at [facility],  [place] [address] [place], [place] [address] ?  ? Culture  Setup Time   Final  ?  GRAM POSITIVE COCCI IN CLUSTERS ?IN PEDIATRIC BOTTLE ?CRITICAL VALUE NOTED.  VALUE IS CONSISTENT WITH PREVIOUSLY REPORTED AND CALLED VALUE. ?

## 2022-01-17 NOTE — Hospital Course (Signed)
71 year old with history of HTN, HLD, cervical spondylosis, gout, vitamin D deficiency, mitral insufficiency, pulmonary HTN, DM 2, OSA on CPAP presented to ED on 4/6 for generalized weakness and poor oral intake.  He was diagnosed with MSSA bacteremia, sepsis.  Echocardiogram was unremarkable, podiatry was consulted for necrotic foot who performed partial right second toe amputation on 4/8.  Was also noted to have right lateral foot abscess requiring I&D on 4/12.  Hospital course was complicated by acute kidney injury and worsening mental status. ?Despite several efforts his condition continued to progress and worsen.  He has been transitioned to hospice care/comfort care. ?

## 2022-01-18 MED ORDER — METHOCARBAMOL 750 MG PO TABS: 750.0000 mg | ORAL_TABLET | Freq: Four times a day (QID) | ORAL | Status: AC | PRN

## 2022-01-18 MED ORDER — OLANZAPINE 5 MG PO TABS: 5.0000 mg | ORAL_TABLET | Freq: Two times a day (BID) | ORAL | Status: AC

## 2022-01-18 MED ORDER — HALOPERIDOL 0.5 MG PO TABS: 0.5000 mg | ORAL_TABLET | ORAL | Status: AC | PRN

## 2022-01-18 MED ORDER — POLYVINYL ALCOHOL 1.4 % OP SOLN: 1.0000 [drp] | Freq: Four times a day (QID) | OPHTHALMIC | Status: AC | PRN

## 2022-01-18 NOTE — Progress Notes (Signed)
? ?                                                                                                                                                     ?                                                   ?Daily Progress Note  ? ?Patient Name: Vincent White.       Date: 01/18/2022 ?DOB: June 20, 1951  Age: 71 y.o. MRN#: [id number] ?Attending Physician: Aline August, MD ?Primary Care Physician: Leighton Ruff, MD (Inactive) ?Admit Date: 01/04/2022 ? ?Reason for Consultation/Follow-up: Hospice Evaluation ? ?Subjective: ?I saw and examined Vincent White today.  He slept through most of encounter.  He did open his eyes briefly and responded "no" when asked about his pain.  His face appears more sunken today. ? ?Discussed with his daughter at the bedside and she reports discussing with primary attending this morning who agreed that residential hospice was most appropriate placement as he is in the last couple weeks of his life. ? ?He did have a little more urine that was less "sludgy" in his foley bag today (100 cc) but not sure last time it was emptied. ? ?Length of Stay: 14 ? ?Current Medications: ?Scheduled Meds:  ? OLANZapine  5 mg Oral BID  ? ? ?Continuous Infusions: ? chlorproMAZINE (THORAZINE) IV    ? ? ?PRN Meds: ?acetaminophen **OR** acetaminophen, albuterol, antiseptic oral rinse, chlorproMAZINE (THORAZINE) IV, diphenhydrAMINE, glycopyrrolate **OR** glycopyrrolate **OR** glycopyrrolate, haloperidol **OR** haloperidol **OR** haloperidol lactate, HYDROmorphone (DILAUDID) injection, ipratropium, methocarbamol, ondansetron **OR** ondansetron (ZOFRAN) IV, polyvinyl alcohol ? ?Physical Exam         ?General: Sleepy. Aroused only briefly. ?Heart: Tachycardic ?Lungs: Decreased ?Ext: + edema ?Skin: Warm and dry ? ?Vital Signs: BP 121/66 (BP Location: Left Arm)   Pulse (!) 107   Temp (!) 100.5 ?F (38.1 ?C) (Oral)   Resp 20   Ht '5\' 6"'$  (1.676 m)   Wt 90.3 kg   SpO2 93%   BMI 32.12 kg/m?  ?SpO2: SpO2: 93 % ?O2 Device: O2  Device: Room Air ?O2 Flow Rate: O2 Flow Rate (L/min): 3 L/min ? ?Intake/output summary:  ?Intake/Output Summary (Last 24 hours) at 01/18/2022 1008 ?Last data filed at 01/17/2022 1658 ?Gross per 24 hour  ?Intake --  ?Output 100 ml  ?Net -100 ml  ? ?LBM: Last BM Date : 01/18/22 ?Baseline Weight: Weight: 86.2 kg ?Most recent weight: Weight: 90.3 kg ? ?     ?Palliative Assessment/Data: ? ? ? ?Flowsheet Rows   ? ?Flowsheet Row Most Recent Value  ?Intake Tab   ?Referral Department Hospitalist  ?Unit at Time of  Referral Med/Surg Unit  ?Palliative Care Primary Diagnosis Sepsis/Infectious Disease  ?Date Notified 01/15/22  ?Palliative Care Type New Palliative care  ?Reason for referral Clarify Goals of Care  ?Date of Admission 01/04/22  ?Date first seen by Palliative Care 01/17/22  ?# of days Palliative referral response time 2 Day(s)  ?# of days IP prior to Palliative referral 11  ?Clinical Assessment   ?Palliative Performance Scale Score 20%  ?Psychosocial & Spiritual Assessment   ?Palliative Care Outcomes   ?Patient/Family meeting held? Yes  ?Who was at the meeting? Daughter  ? ?  ? ? ?Patient Active Problem List  ? Diagnosis Date Noted  ? Major depressive disorder, recurrent episode, moderate ([place]) 01/10/2022  ? MSSA bacteremia   ? Staphylococcal arthritis of left ankle ([place])   ? Pressure injury of skin 01/07/2022  ? Osteomyelitis of second toe of right foot ([place])   ? Hyponatremia 01/04/2022  ? Erectile dysfunction 01/04/2022  ? Mild pulmonary hypertension ([place]) 01/04/2022  ? Type 2 diabetes mellitus with other specified complication ([place]) 00/93/8182  ? Type 2 diabetes mellitus with hyperglycemia ([place]) 01/04/2022  ? Gout 01/04/2022  ? AKI (acute kidney injury) ([place]) 01/04/2022  ? Transaminitis 01/04/2022  ? CAP (community acquired pneumonia) 01/04/2022  ? OSA (obstructive sleep apnea) 01/04/2022  ? Injury of right second toe 01/04/2022  ? Mitral valve insufficiency 06/12/2018  ? Essential hypertension 06/12/2018  ?  Dyslipidemia 06/12/2018  ? SOB (shortness of breath) 06/12/2018  ? Bruit 06/12/2018  ? ? ?Palliative Care Assessment & Plan  ? ?Patient Profile: ?71 y.o. male  with past medical history of hypertension, hyperlipidemia, cervical spondylolysis, gout, vitamin D deficiency, mitral insufficiency, pulmonary hypertension, diabetes, OSA admitted on 01/04/2022 with weakness and poor oral intake.  He was diagnosed with MSSA bacteremia and sepsis.  Echo was unremarkable but podiatry was consulted for necrotic foot and had partial amputation on 4/8.  He also had a I&D of right lateral foot abscess on 4/12.  His mental status has worsened throughout admission and he is also suffering from renal failure.  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  Goal is now comfort moving forward and palliative asked to assess for assistance with prognostication and appropriateness of residential hospice placement. ? ?Recommendations/Plan: ?Full comfort care ?It appears to me that Vincent White is in the last 2 weeks of his life.  Primary driver of this is renal failure secondary to sepsis.  I agree that residential hospice would be in his best interest if it can be arranged. ? ?Goals of Care and Additional Recommendations: ?Limitations on Scope of Treatment: Full Comfort Care ? ?Code Status: ? ?  ?Code Status Orders  ?(From admission, onward)  ?  ? ? ?  ? ?  Start     Ordered  ? 01/15/22 1336  Do not attempt resuscitation (DNR)  Continuous       ?Question Answer Comment  ?In the event of cardiac or respiratory ARREST Do not call a ?code blue?   ?In the event of cardiac or respiratory ARREST Do not perform Intubation, CPR, defibrillation or ACLS   ?In the event of cardiac or respiratory ARREST Use medication by any route, position, wound care, and other measures to relive pain and suffering. May use oxygen, suction and manual treatment of airway obstruction as needed for comfort.   ?  ? 01/15/22 1337   ? ?  ?  ? ?  ? ?Code Status History   ? ?  Date Active Date Inactive Code Status Order ID Comments User Context  ? 01/15/2022 1330 01/15/2022 1337 DNR [id number]  Patrecia Pour, MD Inpatient  ? 01/04/2022 1257 01/15/2022 1330 Full Code [id number]  Reubin Milan, MD ED  ? ?  ? ? ?Prognosis: ? < 2 weeks ? ?Discharge Planning: ?Hospice facility ? ?Care plan was discussed with daughter ? ?Thank you for allowing the Palliative Medicine Team to assist in the care of this patient. ? ?Total time: 30 minutes ? ?Micheline Rough, MD ? ?Please contact Palliative Medicine Team phone at [phone] for questions and concerns.  ? ?

## 2022-01-18 NOTE — Progress Notes (Addendum)
Manufacturing engineer [facility]) Hospital Liaison Note ? ?Bed offered and accepted for transfer to [facility] today. Unit RN please call report to [phone] prior to patient leaving the unit.  ? ?Please call with any questions or concerns. Thank you ? ? ?Roselee Nova, LCSW ?[facility] Liaison ?[phone] ?

## 2022-01-18 NOTE — TOC Transition Note (Signed)
Transition of Care (TOC) - CM/SW Discharge Note ? ? ?Patient Details  ?Name: Vincent White. ?MRN: [id number] ?Date of Birth: December 15, 1950 ? ?Transition of Care (TOC) CM/SW Contact:  ?Oliver Neuwirth, Marjie Skiff, RN ?Phone Number: ?01/18/2022, 1:10 PM ? ? ?Clinical Narrative:    ?Pt to dc to [facility] today via [place]. [facility] called for pick up. Yellow DNR on the chart for transport. RN has called report to [facility]. ? ?

## 2022-01-18 NOTE — Consult Note (Signed)
? ?                                                                                ?Consultation Note ?Date: 01/18/2022  ? ?Patient Name: Vincent White.  ?DOB: 1951/02/13  MRN: [id number]  Age / Sex: 71 y.o., male  ?PCP: Leighton Ruff, MD (Inactive) ?Referring Physician: Aline August, MD ? ?Reason for Consultation: Hospice Evaluation ? ?HPI/Patient Profile: 71 y.o. male  with past medical history of hypertension, hyperlipidemia, cervical spondylolysis, gout, vitamin D deficiency, mitral insufficiency, pulmonary hypertension, diabetes, OSA admitted on 01/04/2022 with weakness and poor oral intake.  He was diagnosed with MSSA bacteremia and sepsis.  Echo was unremarkable but podiatry was consulted for necrotic foot and had partial amputation on 4/8.  He also had a I&D of right lateral foot abscess on 4/12.  His mental status has worsened throughout admission and he is also suffering from renal failure.  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  Goal is now comfort moving forward and palliative asked to assess for assistance with prognostication and appropriateness of residential hospice placement. ? ?Clinical Assessment and Goals of Care: ?Palliative care consult received.  Chart reviewed including personal review of pertinent labs and imaging.  Discussed case in detail with Dr. Florene Glen. ? ?I met today with Vincent White and his daughter, Vincent White.  He did not awaken throughout the encounter but was noted to not have any distress.  Regular respirations. ? ?Discussed with his daughter who reports that she has seen continued decline in his awareness and also continued decline in his intake.  States it is been several days since he had any solid intake and at this point he is limited to sucking on toothette.  He has very some moments of awareness at this point but they are becoming fewer and far between. ? ?His urine output has continued to decrease and at time of my encounter  there was approximately 20 mL of sludge in his Foley bag.  Nurse tech reports this has not been changed or emptied today. ? ?I discussed with his daughter regarding prognosis and she reports that she has been told by 2 other physicians that they think that he is in the last week of life.  We discussed close monitoring of his intake and also his overall interactiveness but I shared with her that I agree we are likely in the last couple weeks of life at this point.  We talked about the fact that his renal failure will likely be because of his death.  Discussed potential etiologies of this renal failure including contributions of sepsis and cirrhosis. ? ?SUMMARY OF RECOMMENDATIONS   ?-DNR/DNI ?-We will plan to reassess tomorrow, but based upon my initial examination of him today, I do think we are likely in the last couple weeks of his life.  I believe the cause of his death will be related to renal failure secondary to underlying sepsis.  While he has had surgical interventions and antibiotic therapy, he still has abscess without source control near his spine.  While he could potentially live for some time with infection, I think the fact that his kidneys appear  to be shutting down will be the ultimate cause of his demise.  With his decreasing urine output I think his kidneys are shutting down and I discussed this with his daughter at the bedside today. ? ?Code Status/Advance Care Planning: ?DNR ? ?Prognosis:  ?I believe most likely scenario is that he will continue to progress in renal failure and likely prognosis at this time is less than 2 weeks. ? ?Discharge Planning: Hospice facility  ? ?  ? ?Primary Diagnoses: ?Present on Admission: ? Hyponatremia ? Essential hypertension ? Dyslipidemia ? Type 2 diabetes mellitus with hyperglycemia (HCC) ? Gout ? AKI (acute kidney injury) ([place]) ? Transaminitis ? CAP (community acquired pneumonia) ? OSA (obstructive sleep apnea) ? Injury of right second toe ? Major depressive  disorder, recurrent episode, moderate ([place]) ? ? ?I have reviewed the medical record, interviewed the patient and family, and examined the patient. The following aspects are pertinent. ? ?Past Medical History:  ?Diagnosis Date  ? Cervical spondylosis   ? Gout   ? History of kidney stones   ? HLD (hyperlipidemia)   ? Hypertension   ? OSA (obstructive sleep apnea)   ? CPAP  ? Vitamin D deficiency   ? ?Social History  ? ?Socioeconomic History  ? Marital status: Divorced  ?  Spouse name: Not on file  ? Number of children: 1  ? Years of education: Not on file  ? Highest education level: Not on file  ?Occupational History  ? Occupation: retired Programmer, systems  ?Tobacco Use  ? Smoking status: Never  ? Smokeless tobacco: Never  ?Vaping Use  ? Vaping Use: Never used  ?Substance and Sexual Activity  ? Alcohol use: Yes  ?  Alcohol/week: 1.0 standard drink  ?  Types: 1 Standard drinks or equivalent per week  ? Drug use: Never  ? Sexual activity: Not on file  ?Other Topics Concern  ? Not on file  ?Social History Narrative  ? Not on file  ? ?Social Determinants of Health  ? ?Financial Resource Strain: Not on file  ?Food Insecurity: Not on file  ?Transportation Needs: Not on file  ?Physical Activity: Not on file  ?Stress: Not on file  ?Social Connections: Not on file  ? ?Family History  ?Problem Relation Age of Onset  ? Cancer - Other Mother   ?     bone  ? Diabetes Mother   ? Heart attack Father 39  ? Diabetes Father   ? Hypertension Father   ? Cancer - Other Sister   ?     breast  ? Gout Brother   ? Hypertension Maternal Grandmother   ? Hypertension Maternal Grandfather   ? Colon cancer Neg Hx   ? Liver disease Neg Hx   ? Kidney disease Neg Hx   ? ?Scheduled Meds: ? OLANZapine  5 mg Oral BID  ? ?Continuous Infusions: ? chlorproMAZINE (THORAZINE) IV    ? ?PRN Meds:.acetaminophen **OR** acetaminophen, albuterol, antiseptic oral rinse, chlorproMAZINE (THORAZINE) IV, diphenhydrAMINE, glycopyrrolate **OR** glycopyrrolate **OR**  glycopyrrolate, haloperidol **OR** haloperidol **OR** haloperidol lactate, HYDROmorphone (DILAUDID) injection, ipratropium, methocarbamol, ondansetron **OR** ondansetron (ZOFRAN) IV, polyvinyl alcohol ?Medications Prior to Admission:  ?Prior to Admission medications   ?Medication Sig Start Date End Date Taking? Authorizing Provider  ?acetaminophen (TYLENOL) 500 MG tablet Take 1,000 mg by mouth every 6 (six) hours as needed for mild pain.   Yes [provider]  ?amLODipine (NORVASC) 10 MG tablet Take 10 mg by mouth daily. 05/09/18  Yes [provider]  ?atorvastatin (LIPITOR) 40 MG tablet TAKE 1 TABLET (40 MG TOTAL) BY MOUTH DAILY. PLEASE SCHEDULE OFFICE VISIT FOR REFILLS. 09/26/21  Yes Minus Breeding, MD  ?Celecoxib (CELEBREX PO) Take 200 mg by mouth daily as needed (pain).   Yes [provider]  ?Cholecalciferol 2000 units CAPS Take 2,000 Units by mouth daily.   Yes [provider]  ?losartan (COZAAR) 100 MG tablet Take 100 mg by mouth at bedtime. 06/01/18  Yes [provider]  ?metFORMIN (GLUCOPHAGE) 500 MG tablet Take 500 mg by mouth 2 (two) times daily with a meal.   Yes [provider]  ?Sodium Bicarbonate-Citric Acid (ALKA-SELTZER HEARTBURN) 1940-1000 MG TBEF Take 2 tablets by mouth every 6 (six) hours as needed (heartburn).   Yes [provider]  ?aspirin 81 MG tablet Take 81 mg by mouth daily. ?Patient not taking: Reported on 01/04/2022    [provider]  ?cyclobenzaprine (FLEXERIL) 5 MG tablet Take 5 mg by mouth at bedtime as needed for muscle spasms. ?Patient not taking: Reported on 01/04/2022 12/27/21   [provider]  ?HYDROcodone-acetaminophen (NORCO) 10-325 MG per tablet Take 1 tablet by mouth 2 (two) times daily. ?Patient not taking: Reported on 01/04/2022    [provider]  ? ?Allergies  ?Allergen Reactions  ? Allopurinol Other (See Comments)  ?  Dehydrated   ? Flexeril [Cyclobenzaprine] Other (See Comments)  ?   confusion  ? Nasal Spray Other (See Comments)  ?  Elevated BP  ? ?Review of Systems ?Unable to obtain ? ?Physical Exam ?General: Somnolent ?Heart: Tachycardic ?Lungs: Decreased ?Ext: + edema ?Skin: Warm and dry ?

## 2022-01-18 NOTE — Progress Notes (Signed)
Report given to Nash Mantis, RN at [facility]. Per Jonelle Sidle, this nurse to leave IV access in place at time of transport. Foley cath to be accompanied with transport for comfort. Family remains at bedside at this time. ?

## 2022-01-18 NOTE — Discharge Summary (Addendum)
Physician Discharge Summary  ?Vincent White. SWF:[id number] DOB: 06-08-1951 DOA: 01/04/2022 ? ?PCP: Leighton Ruff, MD (Inactive) ? ?Admit date: 01/04/2022 ?Discharge date: 01/18/2022 ? ?Admitted From: Home ?Disposition: Residential hospice. ? ?Recommendations for Outpatient Follow-up:  ?Follow up with residential hospice at earliest convenience ? ? ? ?Home Health: No ?Equipment/Devices: None ? ?Discharge Condition: Poor ?CODE STATUS: DNR ?Diet recommendation: As per comfort measures ? ?Brief/Interim Summary: ?71 year old with history of HTN, HLD, cervical spondylosis, gout, vitamin D deficiency, mitral insufficiency, pulmonary HTN, DM 2, OSA on CPAP presented to ED on 4/6 for generalized weakness and poor oral intake.  He was diagnosed with MSSA bacteremia, sepsis.  Echocardiogram was unremarkable, podiatry was consulted for necrotic foot who performed partial right second toe amputation on 4/8.  Was also noted to have right lateral foot abscess requiring I&D on 4/12.  Hospital course was complicated by acute kidney injury and worsening mental status. ?Despite several efforts, his condition continued to progress and worsen.  He has been transitioned to hospice care/comfort care.  He will be discharged to residential hospice once bed is available. ? ?Discharge Diagnoses:  ? 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 ?Plan ?-As discussed above, he will be discharged to residential hospice once bed is available.  Overall prognosis is poor. ? ?Discharge  Instructions ? ?Discharge Instructions   ? ? No wound care   Complete by: As directed ?  ? ?  ? ?Allergies as of 01/18/2022   ? ?   Reactions  ? Allopurinol Other (See Comments)  ? Dehydrated   ? Flexeril [cyclobenzaprine] Other (See Comments)  ? confusion  ? Nasal Spray Other (See Comments)  ? Elevated BP  ? ?  ? ?  ?Medication List  ?  ? ?STOP taking these medications   ? ?acetaminophen 500 MG tablet ?Commonly known as: TYLENOL ?  ?Alka-Seltzer Heartburn 1940-1000 MG Tbef ?Generic drug: Sodium Bicarbonate-Citric Acid ?  ?amLODipine 10 MG tablet ?Commonly known as: NORVASC ?  ?aspirin 81 MG tablet ?  ?atorvastatin 40 MG tablet ?Commonly known as: LIPITOR ?  ?CELEBREX PO ?  ?Cholecalciferol 50 MCG (2000 UT) Caps ?  ?cyclobenzaprine 5 MG tablet ?Commonly known as: FLEXERIL ?  ?HYDROcodone-acetaminophen 10-325 MG tablet ?Commonly known as: NORCO ?  ?losartan 100 MG tablet ?Commonly known as: COZAAR ?  ?metFORMIN 500 MG tablet ?Commonly known as: GLUCOPHAGE ?  ? ?  ? ?TAKE these medications   ? ?haloperidol 0.5 MG tablet ?Commonly known as: HALDOL ?Take 1 tablet (0.5 mg total) by mouth every 4 (four) hours as needed for agitation (or delirium). ?  ?methocarbamol 750 MG tablet ?Commonly known as: ROBAXIN ?Take 1 tablet (750 mg total) by mouth every 6 (six) hours as needed for muscle spasms. ?  ?OLANZapine 5 MG tablet ?Commonly known as: ZYPREXA ?Take 1 tablet (5 mg total) by mouth 2 (two) times daily. ?  ?polyvinyl alcohol 1.4 % ophthalmic solution ?Commonly known as: LIQUIFILM TEARS ?Place 1 drop into both eyes 4 (four) times daily as needed for dry eyes. ?  ? ?  ? ? ?Allergies  ?Allergen Reactions  ? Allopurinol Other (See Comments)  ?  Dehydrated   ?  Flexeril [Cyclobenzaprine] Other (See Comments)  ?  confusion  ? Nasal Spray Other (See Comments)  ?  Elevated BP  ? ? ?Consultations: ?ID, podiatry, psychiatry, orthopedics, nephrology, palliative care ? ? ?Procedures/Studies: ?DG Knee 1-2 Views Left ? ?Result Date:  01/05/2022 ?CLINICAL DATA:  Hyponatremia Bilateral knee swelling History of gout, obesity, OSA, mitral valve insufficiency Presenting with weakness, decreased mobility Positive blood cultures EXAM: LEFT KNEE - 1-2 VIEW COMPARISON:  None. FINDINGS: Marked prepatellar soft tissue swelling. Mild tricompartmental spurring. Small knee joint effusion. No fracture or dislocation. IMPRESSION: 1. Marked prepatellar soft tissue swelling. 2. Small knee joint effusion. Electronically Signed   By: Miachel Roux M.D.   On: 01/05/2022 11:53  ? ?DG Knee 1-2 Views Right ? ?Result Date: 01/05/2022 ?CLINICAL DATA:  Hyponatremia Bilateral knee swelling History of gout, obesity, OSA, mitral valve insufficiency Presenting with weakness, decreased mobility Positive blood cultures EXAM: RIGHT KNEE - 1-2 VIEW COMPARISON:  None. FINDINGS: Mild spurring of the medial, patellofemoral, and lateral compartments. No osseous erosions. Marked prepatellar soft tissue swelling with internal calcifications. Small knee joint effusion. Well corticated ossific fragment along the posterior aspect of the knee joint suspicious for loose body. Irregularity of the anterior tibial tuberosity the indicative of prior Osgood-Schlatter disease. IMPRESSION: 1. Marked prepatellar soft tissue swelling. 2. Small knee joint effusion. Electronically Signed   By: Miachel Roux M.D.   On: 01/05/2022 11:52  ? ?MR CERVICAL SPINE W WO CONTRAST ? ?Result Date: 01/11/2022 ?CLINICAL DATA:  Rule out spinal infection EXAM: MRI CERVICAL SPINE WITHOUT AND WITH CONTRAST TECHNIQUE: Multiplanar and multiecho pulse sequences of the cervical spine, to include the craniocervical junction and cervicothoracic junction, were obtained without and with intravenous contrast. CONTRAST:  2m GADAVIST GADOBUTROL 1 MMOL/ML IV SOLN COMPARISON:  None. FINDINGS: Alignment: Normal alignment.  Straightening of the cervical lordosis Vertebrae: Negative for fracture or mass. No evidence of spinal infection.  Small hemangioma T1 vertebral body. Cord: Normal signal and morphology. Posterior Fossa, vertebral arteries, paraspinal tissues: Negative Disc levels: C2-3: Disc degeneration and spurring on the right with moderate to severe right foraminal encroachment. Mild left foraminal encroach C3-4: Disc degeneration and diffuse uncinate spurring. Moderate foraminal encroachment bilaterally. Spinal canal adequate in size C4-5: Disc degeneration and diffuse uncinate spurring. Moderate to advanced facet degeneration on the left. Severe left foraminal encroachment. Moderate right foraminal encroachment. Spinal canal adequate in diameter C5-6: Disc degeneration with uncinate spurring. Mild foraminal narrowing bilaterally C6-7: Disc degeneration and diffuse uncinate spurring. Moderate to severe left foraminal encroachment. Mild right foraminal encroachment. Spinal canal adequate in diameter C7-T1: Disc degeneration and diffuse uncinate spurring. Moderate foraminal encroachment bilaterally. IMPRESSION: Negative for cervical spine infection.  No acute abnormality Cervical spondylosis with bilateral foraminal encroachment at multiple levels due to spurring. Electronically Signed   By: CFranchot GalloM.D.   On: 01/11/2022 18:13  ? ?MR THORACIC SPINE W WO CONTRAST ? ?Result Date: 01/12/2022 ?CLINICAL DATA:  Thoracic osteomyelitis. EXAM: MRI THORACIC WITHOUT AND WITH CONTRAST TECHNIQUE: Multiplanar and multiecho pulse sequences of the thoracic spine were obtained without and with intravenous contrast. CONTRAST:  822mGADAVIST GADOBUTROL 1 MMOL/ML IV SOLN COMPARISON:  None. FINDINGS: Alignment:  Physiologic. Vertebrae: No acute fracture. No aggressive osseous lesion. Severe bone marrow edema on either side of the T5-6 disc space, T6-7 disc space and T7-8 disc space most concerning for osteomyelitis. Paravertebral enhancing soft tissue consistent with a paravertebral phlegmon with interspersed fluid signal areas concerning for an abscess  along the right paravertebral  aspect measuring approximately 5.9 x 3.7 x 1.3 cm. Bilateral small pleural effusions. Right basilar airspace disease concerning for pneumonia. Epidural thickening and enhancement concerni

## 2022-01-29 DEATH — deceased

## 2023-11-05 NOTE — Progress Notes (Deleted)
 This encounter was created in error - please disregard.  This encounter was created in error - please disregard.

## 2023-11-13 NOTE — Progress Notes (Signed)
consult
